# Patient Record
Sex: Male | Born: 1935 | Race: White | Hispanic: No | Marital: Married | Smoking: Never smoker
Health system: Southern US, Community
[De-identification: ages and names within clinical notes are randomized; demographics above are authoritative.]

## PROBLEM LIST (undated history)

## (undated) DIAGNOSIS — N400 Enlarged prostate without lower urinary tract symptoms: Secondary | ICD-10-CM

## (undated) DIAGNOSIS — N390 Urinary tract infection, site not specified: Secondary | ICD-10-CM

## (undated) DIAGNOSIS — E785 Hyperlipidemia, unspecified: Secondary | ICD-10-CM

## (undated) DIAGNOSIS — D126 Benign neoplasm of colon, unspecified: Secondary | ICD-10-CM

## (undated) DIAGNOSIS — IMO0001 Reserved for inherently not codable concepts without codable children: Secondary | ICD-10-CM

## (undated) DIAGNOSIS — I2510 Atherosclerotic heart disease of native coronary artery without angina pectoris: Secondary | ICD-10-CM

## (undated) DIAGNOSIS — I872 Venous insufficiency (chronic) (peripheral): Secondary | ICD-10-CM

## (undated) DIAGNOSIS — C61 Malignant neoplasm of prostate: Secondary | ICD-10-CM

## (undated) DIAGNOSIS — I219 Acute myocardial infarction, unspecified: Secondary | ICD-10-CM

## (undated) DIAGNOSIS — I10 Essential (primary) hypertension: Secondary | ICD-10-CM

## (undated) DIAGNOSIS — A419 Sepsis, unspecified organism: Secondary | ICD-10-CM

## (undated) DIAGNOSIS — M1990 Unspecified osteoarthritis, unspecified site: Secondary | ICD-10-CM

## (undated) DIAGNOSIS — G4730 Sleep apnea, unspecified: Secondary | ICD-10-CM

## (undated) DIAGNOSIS — K219 Gastro-esophageal reflux disease without esophagitis: Secondary | ICD-10-CM

## (undated) DIAGNOSIS — G709 Myoneural disorder, unspecified: Secondary | ICD-10-CM

## (undated) HISTORY — DX: Benign neoplasm of colon, unspecified: D12.6

---
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
## 1968-08-10 HISTORY — PX: VASECTOMY: SHX75

## 1987-08-11 HISTORY — PX: APPENDECTOMY: SHX54

## 1999-08-11 DIAGNOSIS — G4730 Sleep apnea, unspecified: Secondary | ICD-10-CM

## 1999-08-11 HISTORY — DX: Sleep apnea, unspecified: G47.30

## 2000-01-21 ENCOUNTER — Ambulatory Visit (HOSPITAL_BASED_OUTPATIENT_CLINIC_OR_DEPARTMENT_OTHER): Admission: RE | Admit: 2000-01-21 | Discharge: 2000-01-21 | Payer: Self-pay | Admitting: Endocrinology

## 2000-12-29 ENCOUNTER — Other Ambulatory Visit: Admission: RE | Admit: 2000-12-29 | Discharge: 2000-12-29 | Payer: Self-pay | Admitting: Urology

## 2000-12-29 ENCOUNTER — Encounter (INDEPENDENT_AMBULATORY_CARE_PROVIDER_SITE_OTHER): Payer: Self-pay

## 2000-12-30 HISTORY — PX: PROSTATE BIOPSY: SHX241

## 2002-01-08 DIAGNOSIS — D126 Benign neoplasm of colon, unspecified: Secondary | ICD-10-CM

## 2002-01-08 HISTORY — DX: Benign neoplasm of colon, unspecified: D12.6

## 2002-01-10 ENCOUNTER — Encounter (INDEPENDENT_AMBULATORY_CARE_PROVIDER_SITE_OTHER): Payer: Self-pay | Admitting: Specialist

## 2002-01-10 ENCOUNTER — Ambulatory Visit (HOSPITAL_COMMUNITY): Admission: RE | Admit: 2002-01-10 | Discharge: 2002-01-10 | Payer: Self-pay | Admitting: *Deleted

## 2002-08-10 DIAGNOSIS — M1990 Unspecified osteoarthritis, unspecified site: Secondary | ICD-10-CM

## 2002-08-10 HISTORY — PX: ROTATOR CUFF REPAIR: SHX139

## 2002-08-10 HISTORY — DX: Unspecified osteoarthritis, unspecified site: M19.90

## 2004-02-28 ENCOUNTER — Ambulatory Visit (HOSPITAL_COMMUNITY): Admission: RE | Admit: 2004-02-28 | Discharge: 2004-02-28 | Payer: Self-pay | Admitting: *Deleted

## 2004-02-28 ENCOUNTER — Encounter (INDEPENDENT_AMBULATORY_CARE_PROVIDER_SITE_OTHER): Payer: Self-pay | Admitting: *Deleted

## 2005-04-02 ENCOUNTER — Ambulatory Visit (HOSPITAL_COMMUNITY): Admission: RE | Admit: 2005-04-02 | Discharge: 2005-04-02 | Payer: Self-pay | Admitting: *Deleted

## 2005-04-02 ENCOUNTER — Encounter (INDEPENDENT_AMBULATORY_CARE_PROVIDER_SITE_OTHER): Payer: Self-pay | Admitting: *Deleted

## 2005-08-10 DIAGNOSIS — I219 Acute myocardial infarction, unspecified: Secondary | ICD-10-CM

## 2005-08-10 DIAGNOSIS — I2510 Atherosclerotic heart disease of native coronary artery without angina pectoris: Secondary | ICD-10-CM

## 2005-08-10 HISTORY — DX: Acute myocardial infarction, unspecified: I21.9

## 2005-08-10 HISTORY — DX: Atherosclerotic heart disease of native coronary artery without angina pectoris: I25.10

## 2005-09-18 HISTORY — PX: ANGIOPLASTY: SHX39

## 2007-05-06 ENCOUNTER — Encounter (INDEPENDENT_AMBULATORY_CARE_PROVIDER_SITE_OTHER): Payer: Self-pay | Admitting: *Deleted

## 2007-05-06 ENCOUNTER — Ambulatory Visit (HOSPITAL_COMMUNITY): Admission: RE | Admit: 2007-05-06 | Discharge: 2007-05-06 | Payer: Self-pay | Admitting: *Deleted

## 2010-12-23 NOTE — Op Note (Signed)
NAME:  Scott [name], WINTHROP                  ACCOUNT NO.:  [id number]   MEDICAL RECORD NO.:  [id number]          PATIENT TYPE:  AMB   LOCATION:  ENDO                         FACILITY:  [facility]   PHYSICIAN:  Georgiana Spinner, M.D.    DATE OF BIRTH:  [birth year]   DATE OF PROCEDURE:  DATE OF DISCHARGE:                               OPERATIVE REPORT   PROCEDURE:  Upper endoscopy.   INDICATIONS:  GERD.   ANESTHESIA:  Fentanyl 75 mcg, Versed 6 mg.   PROCEDURE:  With the patient mildly sedated in the left lateral  decubitus position, the Pentax videoscopic endoscope was inserted in the  mouth and passed under direct vision through the esophagus which  appeared normal, except there was 1 island of Barrett's esophagus and an  area of the squamocolumnar junction that looked irregular and probably  was a second area.  The latter was photographed but both were biopsied.  We entered into the stomach.  The fundus, body, antrum, duodenal bulb,  and second portion of the duodenum appeared normal.  From this point,  the endoscope was slowly withdrawn, taking circumferential views of the  duodenal mucosa until the endoscope had been pulled back into the  stomach, placed in retroflexion to view the stomach from below.  The  endoscope was straightened and withdrawn, taking circumferential views  of the remaining gastric and esophageal mucosa.  The patient's vital  signs and pulse oximeter remained stable.  The patient tolerated the  procedure well without apparent complication.   FINDINGS:  Very loose wrap of the GE junction around the endoscope and  two areas of presumed Barrett's esophagus.  Await biopsy report.  The  patient will call me for results and follow up with me as an outpatient.           ______________________________  Georgiana Spinner, M.D.     GMO/MEDQ  D:  05/06/2007  T:  05/06/2007  Job:  [id number]

## 2010-12-26 NOTE — Op Note (Signed)
NAME:  Scott [name], Scott [name]                  ACCOUNT NO.:  [id number]   MEDICAL RECORD NO.:  [id number]          PATIENT TYPE:  AMB   LOCATION:  ENDO                         FACILITY:  [facility]   PHYSICIAN:  Georgiana Spinner, M.D.    DATE OF BIRTH:  May 23, 1936   DATE OF PROCEDURE:  DATE OF DISCHARGE:                                 OPERATIVE REPORT   PROCEDURES:  Upper endoscopy with biopsy.   INDICATIONS:  Gastroesophageal reflux disease.   ANESTHESIA:  Demerol 50, Versed 5 mg.   PROCEDURE:  With the patient mildly sedated in the left lateral decubitus  position, the Olympus videoscopic endoscope was inserted in the mouth and  passed under direct vision through the esophagus which appeared normal until  we reached the distal esophagus and there appeared to be changes of  Barrett's, photographed and biopsied.  We entered into the stomach.  The  fundus, body, antrum, duodenal bulb, second portion of the duodenum were  visualized. From this point, the endoscope was slowly withdrawn taking  circumferential views of the duodenal mucosa until the endoscope had been  pulled back into the stomach and placed in retroflexion to view the stomach  from below. The endoscope was then straightened and withdrawn, taking  circumferential views of the remaining gastric and esophageal mucosa  stopping in the body of the stomach where erythematous changes were noted,  thickened and some edema of the gastric wall was noted and this was  biopsied. The patient's vital ,signs pulse oximeter remained stable. The  patient tolerated the procedure well with no apparent complications.   FINDINGS:  Changes of some thickening and edema of the gastric body,  biopsied.  Barrett's esophagus biopsied. Await biopsy report. The patient  will call me for results and follow up with me as an outpatient.           ______________________________  Georgiana Spinner, M.D.     GMO/MEDQ  D:  04/02/2005  T:  04/02/2005  Job:  [id number]

## 2010-12-26 NOTE — Op Note (Signed)
NAME:  Scott [name], Scott [name]                            ACCOUNT NO.:  [id number]   MEDICAL RECORD NO.:  [id number]                   PATIENT TYPE:  AMB   LOCATION:  ENDO                                 FACILITY:  [facility]   PHYSICIAN:  Georgiana Spinner, M.D.                 DATE OF BIRTH:  [birth year]   DATE OF PROCEDURE:  02/28/2004  DATE OF DISCHARGE:                                 OPERATIVE REPORT   PROCEDURE PERFORMED:  Colonoscopy.   ENDOSCOPIST:  Georgiana Spinner, M.D.   DESCRIPTION OF PROCEDURE:  With the patient mildly sedated in the left  lateral decubitus position, a rectal examination was performed which showed  a slightly enlarged prostate but without nodules, otherwise unremarkable.  Subsequently, the Olympus video colonoscope was inserted in the rectum and  passed under direct vision with pressure applied to the abdomen.  We were  able to reach the cecum, identified by the ileocecal valve and appendiceal  orifice, both of which were photographed.  From this point the colonoscope  was slowly withdrawn taking circumferential views of the colonic mucosa  stopping only in the rectum which appeared normal on direct and showed  hemorrhoids on retroflex view.  The endoscope was straightened and  withdrawn.  The patient's vital signs and pulse oximeter remained stable.  The patient tolerated the procedure well without apparent complications.   FINDINGS:  Internal hemorrhoids.  Otherwise unremarkable colonoscopic  examination.   PLAN:  See endoscopy note for further details.                                               Georgiana Spinner, M.D.    GMO/MEDQ  D:  02/28/2004  T:  02/28/2004  Job:  [id number]

## 2010-12-26 NOTE — Procedures (Signed)
[place]  Patient:    BARTLETT, ENKE Visit Number: [id number] MRN: [id number]          Service Type: END Location: ENDO Attending Physician:  Sabino Gasser Dictated by:   Sabino Gasser, M.D. Proc. Date: 01/10/02 Admit Date:  01/10/2002                             Procedure Report  PROCEDURE PERFORMED:  Colonoscopy.  ENDOSCOPIST:  Sabino Gasser, M.D.  INDICATIONS FOR PROCEDURE:  Colon cancer screening.  ANESTHESIA:  Demerol 10 mg, Versed 1 mg.  DESCRIPTION OF PROCEDURE:  With the patient mildly sedated in the left lateral decubitus position, a rectal exam was performed which was unremarkable. Subsequently, the Olympus videoscopic colonoscope was inserted in the rectum and passed under direct vision into the cecum, identified by the ileocecal valve and appendiceal orifice.  From this point, the colonoscope was slowly withdrawn, taking circumferential views of the entire colonic mucosa stopping then only in the rectum, which appeared normal on direct view and showed hemorrhoids on retroflex view.  The endoscope was straightened and withdrawn. Patients vital signs and pulse oximeter remained stable.  The patient tolerated the procedure well and without apparent complications.  FINDINGS:  Internal hemorrhoids.  PLAN:  Await biopsy report from endoscopy and have the patient follow up with me as described above. Dictated by:   Sabino Gasser, M.D. Attending Physician:  Sabino Gasser DD:  01/10/02 TD:  01/11/02 Job: [id number]

## 2010-12-26 NOTE — Op Note (Signed)
NAME:  Scott [name], Scott [name]                            ACCOUNT NO.:  [id number]   MEDICAL RECORD NO.:  [id number]                   PATIENT TYPE:  AMB   LOCATION:  ENDO                                 FACILITY:  [facility]   PHYSICIAN:  Georgiana Spinner, M.D.                 DATE OF BIRTH:  [birth year]   DATE OF PROCEDURE:  02/28/2004  DATE OF DISCHARGE:                                 OPERATIVE REPORT   PROCEDURE:  Upper endoscopy with biopsy.   INDICATIONS:  Hemoccult positivity.   ANESTHESIA:  1. Demerol 50.  2. Versed 5 mg.   PROCEDURE:  With the patient mildly sedated in the left lateral decubitus  position, the Olympus videoscopic endoscope was inserted in the mouth,  passed under direct vision through the esophagus which appeared normal until  we reached the distal esophagus and there was a question of Barrett's.  Although this may have been normal variant, we could not get the esophagus  to be fully insufflated, therefore, we biopsied this area.  We entered into  the stomach, fundus, body, antrum, duodenal bulb, second portion of duodenum  all appeared normal.  From this point the endoscope was slowly withdrawn,  taking circumferential views of the duodenal mucosa until the endoscope was  pulled back into the stomach, placed in retroflexion to view the stomach  from below.  The endoscope was straightened and withdrawn, taking  circumferential views of the remaining gastric and esophageal mucosa.  The  patient's vital signs and pulse oximeter remained stable.  Patient tolerated  procedure well without apparent complication.   FINDINGS:  Question of Barrett's esophagus, biopsied.  Await biopsy report.  Patient will call me for results and followup with me as an outpatient.                                               Georgiana Spinner, M.D.    GMO/MEDQ  D:  02/28/2004  T:  02/28/2004  Job:  [id number]

## 2010-12-26 NOTE — Op Note (Signed)
[place]  Patient:    Scott [name], Scott [name] Visit Number: [id number] MRN: [id number]          Service Type: END Location: ENDO Attending Physician:  Sabino Gasser Dictated by:   Sabino Gasser, M.D. Proc. Date: 01/10/02 Admit Date:  01/10/2002                             Operative Report  PROCEDURE PERFORMED:  Upper endoscopy.  ENDOSCOPIST:  Sabino Gasser, M.D.  INDICATIONS FOR PROCEDURE:  Gastroesophageal reflux disease.  ANESTHESIA:  Demerol 60 mg, Versed 6 mg.  DESCRIPTION OF PROCEDURE:  With the patient mildly sedated in the left lateral decubitus position, the Olympus video endoscope was inserted in the mouth and passed under direct vision through the esophagus which appeared normal until we reached the distal esophagus and there was a question of esophagitis photographed and biopsied.  We entered into the stomach.  The fundus, body, antrum, duodenal bulb and second portion of the duodenum were visualized. From this point, the endoscope was slowly withdrawn taking circumferential views of the entire duodenal mucosa until the endoscope had been pulled back into the stomach and placed on retroflexion to view the stomach from below and a hiatal hernia was seen and photographed.  The endoscope was then straightened and withdrawn taking circumferential views of the remaining gastric and esophageal mucosa stopping in the antrum of the stomach where changes of gastritis were seen and photographed and biopsied.  Patients vital signs and pulse oximeter remained stable.  The patient tolerated the procedure well without apparent complications.  FINDINGS:  Question of gastritis in the antrum, biopsied and esophagitis seen in the distal esophagus biopsied, await biopsy report.  Patient will call me for results and follow up with me as an outpatient.  Proceed to colonoscopy as planned.  PLAN: Dictated by:   Sabino Gasser, M.D. Attending Physician:  Sabino Gasser DD:  01/10/02 TD:  01/11/02 Job: [id number] IO/[id number]

## 2011-08-11 DIAGNOSIS — C61 Malignant neoplasm of prostate: Secondary | ICD-10-CM

## 2011-08-11 HISTORY — DX: Malignant neoplasm of prostate: C61

## 2012-01-29 ENCOUNTER — Encounter: Payer: Self-pay | Admitting: Gastroenterology

## 2012-02-16 ENCOUNTER — Other Ambulatory Visit: Payer: Self-pay | Admitting: Cardiology

## 2012-02-16 DIAGNOSIS — I872 Venous insufficiency (chronic) (peripheral): Secondary | ICD-10-CM

## 2012-02-23 ENCOUNTER — Ambulatory Visit
Admission: RE | Admit: 2012-02-23 | Discharge: 2012-02-23 | Disposition: A | Discharge status: Home / Self Care | Payer: 59 | Source: Ambulatory Visit | Attending: Cardiology | Admitting: Cardiology

## 2012-02-23 DIAGNOSIS — I872 Venous insufficiency (chronic) (peripheral): Secondary | ICD-10-CM

## 2012-02-23 HISTORY — DX: Acute myocardial infarction, unspecified: I21.9

## 2012-02-23 HISTORY — DX: Atherosclerotic heart disease of native coronary artery without angina pectoris: I25.10

## 2012-02-23 HISTORY — DX: Unspecified osteoarthritis, unspecified site: M19.90

## 2012-02-23 HISTORY — DX: Benign prostatic hyperplasia without lower urinary tract symptoms: N40.0

## 2012-02-23 HISTORY — DX: Gastro-esophageal reflux disease without esophagitis: K21.9

## 2012-02-23 HISTORY — DX: Hyperlipidemia, unspecified: E78.5

## 2012-02-23 HISTORY — DX: Venous insufficiency (chronic) (peripheral): I87.2

## 2012-02-23 HISTORY — DX: Essential (primary) hypertension: I10

## 2012-02-23 HISTORY — DX: Sleep apnea, unspecified: G47.30

## 2012-02-23 NOTE — Progress Notes (Signed)
Patient ID: Scott [name], male   DOB: 03/31/1936, 76 y.o.   MRN: [id number]  Reason for Visit: Bilateral lower extremity swelling and evaluate for venous insufficiency.  History of present illness: [name] is a very nice 76 year old white male with a known history of coronary artery disease. He had a myocardial infarction in 2007 and underwent cardiac catheterization and stent placement. He complains of lower extremity swelling for many years. His main complaint is pain in the calf region. He also complains of some tiredness in the lower extremities. He used to be able to walk for three miles but now his exercise tolerance is limited due to pain and discomfort. He has been wearing knee high compression stockings measuring 15-20 mm of compression. He says that the stockings help with some of the swelling but do not significantly change the pain. He has started taking gabapentin in the last month which he says has improved the pain by 80-90 percent. He still continues to take some Tylenol for symptomatic relief of his pain. At this time, he continues to exercise on a regular basis but his walking is limited due to lower extremity pain and symptoms.  Past Medical History: Myocardial infarction and status post stent in 2007, sleep apnea, appendectomy, rotator cuff surgery, tonsillectomy and adenoidectomy.  Medications: Gabapentin 300 mg, finasteride 5 mg, Plavix 75 mg, ranitidine 150 mg, pravastatin 40 mg, amlodipine 10 mg, aspirin once a day 325 mg, iron, multivitamin, furosemide 40 mg as needed.  Allergies: Sulfa and shellfish.  Social History: He is married and has four children. He is retired. He used to work in Production manager for [employer]. He currently lives in [place]. He denies tobacco or alcohol use.  Review of Systems: In general, he feels like he is in good health. He does have allergy to sulfa and shellfish. No significant eyes, ears, nose, mouth or throat  problems. No significant gastrointestinal problems except for heartburn. GU symptoms: History of elevated PSA. Scheduled for a prostate biopsy according to the patient. Cardiovascular is significant for myocardial infarction and status post stent placement. Musculoskeletal is significant for knee pain and history of right rotator cuff surgery. No significant neurologic problems, psychiatric or pulmonary problems.  Physical Examination: Vitals: Blood pressure 126/65, pulse 57, respirations 15, temperature 97.6, 02 sats 97 percent on room air. In general, healthy appearing elderly man. Heart: Regular rate and rhythm. Lungs: Clear to auscultation bilaterally. Abdomen: Soft, nontender and positive bowel sounds. No evidence for spider veins. He has bruising in the left upper abdomen which he says is related to being on Plavix. Lower extremities: Bounding pulses in both groins and bilateral pedal pulses. There is bilateral symmetric edema involving the calves and ankle regions bilaterally. There is also swelling along the dorsal aspect of both feet. There is some mild redness in the plantar aspects of both feet. No evidence for ulcerations. Patient has no evidence for varicose veins or telangiectasias.  Imaging: Bilateral lower extremity superficial and deep venous studies were done. The study is negative for deep vein thrombosis and negative for superficial venous insufficiency. However, the patient does have atypical lymph nodes in both groin regions. The lymph nodes are rounded and appear to be slightly full.  Assessment: 76 year old male with bilateral lower extremity edema, particularly in the calf and feet. The patient's main complaint is pain and tiredness. Fortunately, the pain has markedly improved since he started taking gabapentin approximately one month ago. The ultrasound exam shows no evidence for deep  vein thrombosis and no evidence for superficial venous insufficiency.  However, the patient does have prominent atypical bilateral lymph nodes in the inguinal regions. According to the patient, he also has an elevated PSA level and is scheduled to have a prostate biopsy. The lymph nodes within the inguinal regions could be related to chronic lower extremity swelling. However, the atypical appearance of the lymph nodes and the history of enlarged prostate and elevated PSA are concerning.  Plan:  1. Bilateral lower extremity swelling: There is no evidence for deep vein thrombosis and no evidence for superficial venous insufficiency. No indication for any percutaneous laser treatment or sclerotherapy. I recommended the patient try thigh high compression stockings measuring 20-30 mmHg. 2. Atypical lymph nodes in inguinal regions bilaterally. I discussed these findings with the referring physician Dr. Jacinto Halim. Dr. Jacinto Halim told me that his technologist also noted prominent lymph nodes during lower extremity arterial exam. Based on the history of prostate enlargement, elevated PSA and abnormal lymph nodes, we will plan to obtain a CT of the abdomen and pelvis with IV and oral contrast to exclude intraabdominal pathology or additional lymphadenopathy.

## 2012-02-24 ENCOUNTER — Other Ambulatory Visit: Payer: Self-pay | Admitting: Cardiology

## 2012-02-24 DIAGNOSIS — R591 Generalized enlarged lymph nodes: Secondary | ICD-10-CM

## 2012-02-25 ENCOUNTER — Other Ambulatory Visit: Payer: PRIVATE HEALTH INSURANCE

## 2012-03-01 ENCOUNTER — Other Ambulatory Visit: Payer: Self-pay | Admitting: Gastroenterology

## 2012-03-07 ENCOUNTER — Encounter: Payer: Self-pay | Admitting: Gastroenterology

## 2012-03-07 ENCOUNTER — Ambulatory Visit (INDEPENDENT_AMBULATORY_CARE_PROVIDER_SITE_OTHER): Payer: PRIVATE HEALTH INSURANCE | Admitting: Gastroenterology

## 2012-03-07 VITALS — BP 120/60 | HR 60 | Ht 66.0 in | Wt 171.1 lb

## 2012-03-07 DIAGNOSIS — K219 Gastro-esophageal reflux disease without esophagitis: Secondary | ICD-10-CM

## 2012-03-07 DIAGNOSIS — Z860100 Personal history of colon polyps, unspecified: Secondary | ICD-10-CM | POA: Insufficient documentation

## 2012-03-07 DIAGNOSIS — I2510 Atherosclerotic heart disease of native coronary artery without angina pectoris: Secondary | ICD-10-CM

## 2012-03-07 MED ORDER — MOVIPREP 100 G PO SOLR: 1.0000 | Freq: Once | ORAL | Status: DC

## 2012-03-07 NOTE — Patient Instructions (Addendum)
You have been scheduled for a colonoscopy with propofol. Please follow written instructions given to you at your visit today.  Please pick up your prep kit at the pharmacy within the next 1-3 days. If you use inhalers (even only as needed), please bring them with you on the day of your procedure. You will be contacted by our office prior to your procedure for directions on holding your Plavix.  If you do not hear from our office 1 week prior to your scheduled procedure, please call [phone] to discuss.  Cc: Darci Needle, MD       Dr. Jacinto Halim

## 2012-03-07 NOTE — Progress Notes (Signed)
History of Present Illness: This is a 76 year old male with a history of colon polyps in June 2003. There was apparently a question of Barrett's esophagus on endoscopy in 2003 however biopsies did not reveal Barrett's esophagus. He has subsequent endoscopies performed 2005, 2006 and 2008 and none of those biopsies revealed Barrett's esophagus. He states his reflux symptoms for under control on Zantac 150 mg twice a day. He has no other gastrointestinal complaints. He is scheduled to undergo a prostate biopsy on August 9. He saw Dr. Jacinto Halim who recently who cleared him to remain off Plavix for 5 days prior to his prostate biopsy. Denies weight loss, abdominal pain, constipation, diarrhea, change in stool caliber, melena, hematochezia, nausea, vomiting, dysphagia, reflux symptoms, chest pain.  Allergies  Allergen Reactions  . Shellfish Allergy Nausea And Vomiting  . Sulfa Antibiotics Other (See Comments)    Feels fatigued and weak when taking sulfa   Outpatient Prescriptions Prior to Visit  Medication Sig Dispense Refill  . amLODipine (NORVASC) 10 MG tablet Take 10 mg by mouth daily.      Marland Kitchen aspirin 81 MG tablet Take 325 mg by mouth daily.       . benazepril (LOTENSIN) 20 MG tablet Take 20 mg by mouth daily.      . clopidogrel (PLAVIX) 75 MG tablet Take 75 mg by mouth daily.      . Ferrous Gluconate (IRON) 240 (27 FE) MG TABS Take 1 capsule by mouth daily.      . finasteride (PROSCAR) 5 MG tablet Take 5 mg by mouth daily.      . Multiple Vitamin (MULTIVITAMIN) tablet Take 1 tablet by mouth daily.      . pravastatin (PRAVACHOL) 80 MG tablet Take 40 mg by mouth daily.      . ranitidine (ZANTAC) 150 MG capsule Take 150 mg by mouth 2 (two) times daily.       Past Medical History  Diagnosis Date  . Arthritis 2004  . Myocardial infarction   . Coronary artery disease 2007    cardiac cath w/ angioplasty & stent placement x 1  . Sleep apnea 2001  . Hypertension   . Hyperlipidemia   . Acid reflux   .  Venous insufficiency   . Enlarged prostate   . Adenomatous colon polyp 01/2002   Past Surgical History  Procedure Date  . Appendectomy 1989  . Angioplasty 09/18/2005    1 stent placed  . Rotator cuff repair 2004    right  . Tonsillectomy and adenoidectomy 1950  . Vasectomy 1970  . Prostate biopsy 12/30/2000   History   Social History  . Marital Status: Married    Spouse Name: N/A    Number of Children: 4  . Years of Education: N/A   Occupational History  . retired Sport and exercise psychologist    Social History Main Topics  . Smoking status: Never Smoker   . Smokeless tobacco: Never Used  . Alcohol Use: No  . Drug Use: No  . Sexually Active: None   Other Topics Concern  . None   Social History Narrative  . None   Family History  Problem Relation Age of Onset  . Prostate cancer Brother   . Diabetes Father   . Diabetes Sister   . Diabetes Brother   . Heart disease Father   . Heart disease Mother   . Heart disease Sister   . Heart disease Brother     Review of Systems: Pertinent positive and negative review  of systems were noted in the above HPI section. All other review of systems were otherwise negative.  Physical Exam: General: Well developed , well nourished, no acute distress Head: Normocephalic and atraumatic Eyes:  sclerae anicteric, EOMI Ears: Normal auditory acuity Mouth: No deformity or lesions Neck: Supple, no masses or thyromegaly Lungs: Clear throughout to auscultation Heart: Regular rate and rhythm; no murmurs, rubs or bruits Abdomen: Soft, non tender and non distended. No masses, hepatosplenomegaly or hernias noted. Normal Bowel sounds Rectal: Deferred to colonoscopy Musculoskeletal: Symmetrical with no gross deformities  Skin: No lesions on visible extremities Pulses:  Normal pulses noted Extremities: No clubbing, cyanosis, edema or deformities noted Neurological: Alert oriented x 4, grossly nonfocal Cervical Nodes:  No significant cervical  adenopathy Inguinal Nodes: No significant inguinal adenopathy Psychological:  Alert and cooperative. Normal mood and affect  Assessment and Recommendations:  1. Personal history of adenomatous colon polyps in June 2003. He is overdue for surveillance colonoscopy. The risks, benefits and alternatives to a 5 day hold Plavix were discussed with the patient and he consents to proceed. Obtain records from Dr. Lewis Moccasin regarding clearance to hold Plavix. The patient can remain on daily aspirin for colonoscopy. He will follow the instructions for his prostate biopsy regarding aspirin. The risks, benefits, and alternatives to colonoscopy with possible biopsy and possible polypectomy were discussed with the patient and they consent to proceed. Will attempt to schedule his colonoscopy prior to the prostate biopsy but I would want to proceed no sooner than 7-10 days after the prostate biopsy if the colonoscopy is scheduled to follow his prostate biopsy.  2. GERD. There was a questionable history of Barrett's esophagus in the past however his last 3 endoscopies with biopseis have not shown any evidence of Barrett's. His reflux symptoms are well controlled on ranitidine 150 mg twice a day. Continue ranitidine and standard antireflux measures. No need for surveillance endoscopy as the patient has been adequately evaluated for Barrett's and this diagnosis has been reasonably excluded.  3. Coronary artery disease with coronary stent placement maintained on Plavix and aspirin. See problem #1.

## 2012-03-09 ENCOUNTER — Telehealth: Payer: Self-pay

## 2012-03-09 NOTE — Telephone Encounter (Signed)
Received records from Dr. Jacinto Halim stating patient has clearance to come off coumadin and aspirin 5 days before his surgery. Informed patient per Dr. Russella Dar to stop 2 days before that so he can be off 5 days before his Colonoscopy also. Pt will stop Plavix and aspirin on 03/11/12. Pt agreed and verbalized understanding.

## 2012-03-16 ENCOUNTER — Ambulatory Visit (AMBULATORY_SURGERY_CENTER): Payer: PRIVATE HEALTH INSURANCE | Admitting: Gastroenterology

## 2012-03-16 ENCOUNTER — Encounter: Payer: Self-pay | Admitting: Gastroenterology

## 2012-03-16 VITALS — BP 132/74 | HR 61 | Temp 97.1°F | Resp 20 | Ht 66.0 in | Wt 171.0 lb

## 2012-03-16 DIAGNOSIS — Z86010 Personal history of colonic polyps: Secondary | ICD-10-CM

## 2012-03-16 DIAGNOSIS — Z1211 Encounter for screening for malignant neoplasm of colon: Secondary | ICD-10-CM

## 2012-03-16 MED ORDER — SODIUM CHLORIDE 0.9 % IV SOLN: 500.0000 mL | INTRAVENOUS | Status: DC

## 2012-03-16 NOTE — Patient Instructions (Signed)
RESTARTING PLAVIX AND ASPIRIN OK WITH DR. Russella Dar WHENEVER CLEARED BY UROLOGIST SINCE HAVING A PROSTATE BX  INFORMATION ON HEMORRHOIDS GIVEN TO YOU TODAY.  YOU HAD AN ENDOSCOPIC PROCEDURE TODAY AT THE  ENDOSCOPY CENTER: Refer to the procedure report that was given to you for any specific questions about what was found during the examination.  If the procedure report does not answer your questions, please call your gastroenterologist to clarify.  If you requested that your care partner not be given the details of your procedure findings, then the procedure report has been included in a sealed envelope for you to review at your convenience later.  YOU SHOULD EXPECT: Some feelings of bloating in the abdomen. Passage of more gas than usual.  Walking can help get rid of the air that was put into your GI tract during the procedure and reduce the bloating. If you had a lower endoscopy (such as a colonoscopy or flexible sigmoidoscopy) you may notice spotting of blood in your stool or on the toilet paper. If you underwent a bowel prep for your procedure, then you may not have a normal bowel movement for a few days.  DIET: Your first meal following the procedure should be a light meal and then it is ok to progress to your normal diet.  A half-sandwich or bowl of soup is an example of a good first meal.  Heavy or fried foods are harder to digest and may make you feel nauseous or bloated.  Likewise meals heavy in dairy and vegetables can cause extra gas to form and this can also increase the bloating.  Drink plenty of fluids but you should avoid alcoholic beverages for 24 hours.  ACTIVITY: Your care partner should take you home directly after the procedure.  You should plan to take it easy, moving slowly for the rest of the day.  You can resume normal activity the day after the procedure however you should NOT DRIVE or use heavy machinery for 24 hours (because of the sedation medicines used during the test).     SYMPTOMS TO REPORT IMMEDIATELY: A gastroenterologist can be reached at any hour.  During normal business hours, 8:30 AM to 5:00 PM Monday through Friday, call [phone].  After hours and on weekends, please call the GI answering service at [phone] who will take a message and have the physician on call contact you.   Following lower endoscopy (colonoscopy or flexible sigmoidoscopy):  Excessive amounts of blood in the stool  Significant tenderness or worsening of abdominal pains  Swelling of the abdomen that is new, acute  Fever of 100F or higher   FOLLOW UP: If any biopsies were taken you will be contacted by phone or by letter within the next 1-3 weeks.  Call your gastroenterologist if you have not heard about the biopsies in 3 weeks.  Our staff will call the home number listed on your records the next business day following your procedure to check on you and address any questions or concerns that you may have at that time regarding the information given to you following your procedure. This is a courtesy call and so if there is no answer at the home number and we have not heard from you through the emergency physician on call, we will assume that you have returned to your regular daily activities without incident.  SIGNATURES/CONFIDENTIALITY: You and/or your care partner have signed paperwork which will be entered into your electronic medical record.  These signatures attest  to the fact that that the information above on your After Visit Summary has been reviewed and is understood.  Full responsibility of the confidentiality of this discharge information lies with you and/or your care-partner.

## 2012-03-16 NOTE — Op Note (Signed)
[facility] [address] [place], [place]  [address]  COLONOSCOPY PROCEDURE REPORT  PATIENT:  Scott [name], Scott [name]  MR#:  [id number] BIRTHDATE:  [birth year], 76 yrs. old  GENDER:  male ENDOSCOPIST:  Judie Petit T. Russella Dar, MD, [facility] Referred by:  Adela Lank, M.D. PROCEDURE DATE:  03/16/2012 PROCEDURE:  Colonoscopy 19147 ASA CLASS:  Class III INDICATIONS:  1) surveillance and high-risk screening  2) history of pre-cancerous (adenomatous) colon polyps: 01/2002 MEDICATIONS:   MAC sedation, administered by CRNA, propofol (Diprivan) 100 mg IV DESCRIPTION OF PROCEDURE:   After the risks benefits and alternatives of the procedure were thoroughly explained, informed consent was obtained.  Digital rectal exam was performed and revealed no abnormalities.   The LB CF-[id number] endoscope was introduced through the anus and advanced to the cecum, which was identified by both the appendix and ileocecal valve, without limitations.  The quality of the prep was adequate, using MoviPrep.  The instrument was then slowly withdrawn as the colon was fully examined. <<PROCEDUREIMAGES>> FINDINGS:  A normal appearing cecum, ileocecal valve, and appendiceal orifice were identified. The ascending, hepatic flexure, transverse, splenic flexure, descending, sigmoid colon, and rectum appeared unremarkable.  Retroflexed views in the rectum revealed internal hemorrhoids, small.  The time to cecum =  4.75 minutes. The scope was then withdrawn (time =  9.75  min) from the patient and the procedure completed.  COMPLICATIONS:  None  ENDOSCOPIC IMPRESSION: 1) Normal colon 2) Internal hemorrhoids  RECOMMENDATIONS: 1) Repeat Colonoscopy in 5 years.  Venita Lick. Russella Dar, MD, [facility]  n. eSIGNED:   Venita Lick. Panagiotis Oelkers at 03/16/2012 01:44 PM  Karlyne Greenspan, [phone]

## 2012-03-17 ENCOUNTER — Telehealth: Payer: Self-pay

## 2012-03-17 NOTE — Telephone Encounter (Signed)
  Follow up Call-  Call back number 03/16/2012  Post procedure Call Back phone  # [phone]  Permission to leave phone message Yes     Patient questions:  Do you have a fever, pain , or abdominal swelling? no Pain Score  0 *  Have you tolerated food without any problems? yes  Have you been able to return to your normal activities? yes  Do you have any questions about your discharge instructions: Diet   no Medications  no Follow up visit  no  Do you have questions or concerns about your Care? no  Actions: * If pain score is 4 or above: No action needed, pain <4.

## 2012-03-18 ENCOUNTER — Encounter: Payer: PRIVATE HEALTH INSURANCE | Admitting: Gastroenterology

## 2012-07-28 ENCOUNTER — Telehealth: Payer: Self-pay | Admitting: Oncology

## 2012-07-28 NOTE — Telephone Encounter (Signed)
S/W PT IN REF TO NP APPT. ON 08/12/12@1 :30 REFERRING DR WOODRUFF DX BILATERAL INGUINAL ADENOPATHY MAILED NP APPT.

## 2012-07-29 ENCOUNTER — Telehealth: Payer: Self-pay | Admitting: Oncology

## 2012-07-29 NOTE — Telephone Encounter (Signed)
C/D 07/29/12 for appt. 08/12/12

## 2012-08-01 ENCOUNTER — Other Ambulatory Visit: Payer: Self-pay | Admitting: Oncology

## 2012-08-01 DIAGNOSIS — R599 Enlarged lymph nodes, unspecified: Secondary | ICD-10-CM

## 2012-08-12 ENCOUNTER — Telehealth: Payer: Self-pay | Admitting: Oncology

## 2012-08-12 ENCOUNTER — Ambulatory Visit (HOSPITAL_BASED_OUTPATIENT_CLINIC_OR_DEPARTMENT_OTHER): Payer: Medicare Other | Admitting: Oncology

## 2012-08-12 ENCOUNTER — Other Ambulatory Visit (HOSPITAL_BASED_OUTPATIENT_CLINIC_OR_DEPARTMENT_OTHER): Payer: Medicare Other | Admitting: Lab

## 2012-08-12 ENCOUNTER — Other Ambulatory Visit: Payer: PRIVATE HEALTH INSURANCE | Admitting: Lab

## 2012-08-12 ENCOUNTER — Ambulatory Visit: Payer: PRIVATE HEALTH INSURANCE

## 2012-08-12 VITALS — BP 143/70 | HR 58 | Temp 97.0°F | Resp 20 | Ht 66.0 in | Wt 171.7 lb

## 2012-08-12 DIAGNOSIS — C61 Malignant neoplasm of prostate: Secondary | ICD-10-CM

## 2012-08-12 DIAGNOSIS — R599 Enlarged lymph nodes, unspecified: Secondary | ICD-10-CM

## 2012-08-12 LAB — CBC WITH DIFFERENTIAL/PLATELET
Basophils Absolute: 0.1 10*3/uL (ref 0.0–0.1)
Eosinophils Absolute: 0.4 10*3/uL (ref 0.0–0.5)
HCT: 41.9 % (ref 38.4–49.9)
LYMPH%: 22.5 % (ref 14.0–49.0)
MONO#: 1 10*3/uL — ABNORMAL HIGH (ref 0.1–0.9)
NEUT#: 5 10*3/uL (ref 1.5–6.5)
NEUT%: 59.7 % (ref 39.0–75.0)
Platelets: 205 10*3/uL (ref 140–400)
WBC: 8.3 10*3/uL (ref 4.0–10.3)

## 2012-08-12 LAB — COMPREHENSIVE METABOLIC PANEL (CC13)
CO2: 24 mEq/L (ref 22–29)
Creatinine: 1.2 mg/dL (ref 0.7–1.3)
Glucose: 123 mg/dl — ABNORMAL HIGH (ref 70–99)
Total Bilirubin: 0.5 mg/dL (ref 0.20–1.20)

## 2012-08-12 LAB — LACTATE DEHYDROGENASE (CC13): LDH: 179 U/L (ref 125–245)

## 2012-08-12 NOTE — Telephone Encounter (Signed)
appts made and printed for pt  °

## 2012-08-12 NOTE — Progress Notes (Signed)
Note dictated

## 2012-08-12 NOTE — Progress Notes (Signed)
CC:   Scott Leatherwood, MD  REASON FOR CONSULTATION:  Adenopathy, history of prostate cancer.  HISTORY OF PRESENT ILLNESS:  Scott [name] is a pleasant 77 year old gentleman, native of [place], lived at multiple locations, currently of [place].  He is a gentleman with a past medical history that is significant for coronary artery disease. The patient was diagnosed with prostate cancer.  He is under the care of Dr. Natalia [name] at [facility].  He presented with an elevated PSA; his PSA in May 2013 was 12.93 and has declined down to 5.8 on 07/21/2012.  He is a currently on a watchful waiting approach.  The patient has gone under evaluation for his chronic lower extremity swelling and was evaluated for possible venous insufficiency.  At that time he had an ultrasound which showed an exophytic hypoechoic area in some in the abdominal lymph nodes, with a lymph node measuring as large as 1.2 cm in the short axis in the inguinal and the upper thigh regions bilaterally.  There was no evidence of any superficial venous insufficiency or any deep vein thrombosis; however, these findings warranted further evaluation.  The patient tells me that he was ordered a CT scan of the abdomen and pelvis with IV contrast and it was cancelled, although he could not really tell me exactly why that is.  The patient was referred to me for evaluation regarding that.  Clinically, he had not had any particular symptoms.  He had not reported any palpable adenopathy.  Did not report any neck adenopathy.  Denied any inguinal area adenopathy or any enlargements of any of his lymph nodes.  He had not reported any constitutional symptoms.  Did not report any weight loss or appetite changes.  He still performs activities of daily living without any hindrance or decline. Clinically he is completely asymptomatic at this time.  He had not had any change in his appetite or performance status.  He is still very functional.   He drives and takes care of his house affairs, including his wife.  REVIEW OF SYSTEMS:  Does not report any headaches, blurry vision, double vision.  Does not report any motor or sensory neuropathy.  Does not report any  alteration in mental status.  Does not report any psychiatric issues or depression.  Does not report any fever, chills, sweats.  Does not report any cough, hemoptysis, or hematemesis.  No nausea, vomiting.  No abdominal pain.  No hematochezia.  No melena.  No genitourinary complaints.  The rest of the review of systems is unremarkable.  PAST MEDICAL HISTORY:  Significant for a history of coronary artery disease, history of GERD, hyperlipidemia, hypertension, arthritis, and chronic lower extremity pain and possible neuropathy.  He is status post appendectomy, rotator cuff surgery, as well as a history of prostate cancer, as mentioned.  MEDICATIONS: 1. Amlodipine. 2. Aspirin. 3. Benazepril. 4. Finasteride. 5. Gabapentin. 6. Iron. 7. Plavix. 8. Pravastatin. 9. Zantac.  ALLERGIES:  Sulfa.  FAMILY HISTORY:  His brother had prostate cancer.  No other history of any malignancies noted.  SOCIAL HISTORY:  He lives with his wife.  He has a son that lives with him as well.  Denied any alcohol or tobacco abuse.  He worked as a [employer] manager.  He also was in the [employer]  PHYSICAL EXAMINATION:  General:  Alert, awake, pleasant gentleman, appeared in no active distress.  Vital Signs:  His blood pressure is 143/70, pulse 58, respirations 20, temperature is 97.  Weight is 171  pounds.  ECOG performance status is 0.  HEENT:  Head is normocephalic, atraumatic.  Pupils equal, round, reactive to light.  Oral mucosa moist and pink.  Neck:  Supple, without adenopathy.  Heart:  Regular rate and rhythm.  S1, S2.  Lungs:  Clear to auscultation.  No rhonchi, wheeze, or dullness to percussion.  Abdomen:  Soft, nontender.  No hepatosplenomegaly.  Extremities:  Had 2+ ankle  edema.  Lymphatic:  I could not appreciate any lymphadenopathy in the neck, supraclavicular, axillary, or inguinal adenopathy.  ASSESSMENT AND PLAN:  This is a pleasant 77 year old gentleman with the following issues: 1. Inguinal and upper thigh enlarged lymph nodes bilaterally:  The     differential diagnosis discussed with Mr. Celani could be possibly     reactive lymphadenopathy related to infection or surgery.     Metastatic solid tumor cancer is also a possibility.  His prostate     cancer is under excellent control.  I do not think that is the case     here.  Obviously a lymphoproliferative process such as lymphoma, an     indolent kind, could be a possibility.  To work this up, I would     like to repeat imaging studies including CT scan of the abdomen and     pelvis with contrast, and if there is any pathologically enlarged     lymph node, then a biopsy would be warranted.  If his lymph nodes     appear reactive, there is really no further workup that is     indicated at this time.  His laboratory data were discussed today     and were all within normal range at this time.  After obtaining a     CT scan, I will ask him to come back and discuss these results, and     will proceed from there. 2. History of prostate cancer:  Again is under watchful observation     under the care of Dr. Margarita Grizzle.  His PSA is under reasonable     control.  He is scheduled for a repeat biopsy at the end of this     month.    ______________________________ Scott [name], M.D. FNS/MEDQ  D:  08/12/2012  T:  08/12/2012  Job:  [id number]

## 2012-08-24 ENCOUNTER — Ambulatory Visit (HOSPITAL_COMMUNITY)
Admission: RE | Admit: 2012-08-24 | Discharge: 2012-08-24 | Disposition: A | Discharge status: Home / Self Care | Payer: Medicare Other | Source: Ambulatory Visit | Attending: Oncology | Admitting: Oncology

## 2012-08-24 ENCOUNTER — Encounter (HOSPITAL_COMMUNITY): Payer: Self-pay

## 2012-08-24 DIAGNOSIS — I517 Cardiomegaly: Secondary | ICD-10-CM | POA: Insufficient documentation

## 2012-08-24 DIAGNOSIS — K8020 Calculus of gallbladder without cholecystitis without obstruction: Secondary | ICD-10-CM | POA: Insufficient documentation

## 2012-08-24 DIAGNOSIS — K449 Diaphragmatic hernia without obstruction or gangrene: Secondary | ICD-10-CM | POA: Insufficient documentation

## 2012-08-24 DIAGNOSIS — I2510 Atherosclerotic heart disease of native coronary artery without angina pectoris: Secondary | ICD-10-CM | POA: Insufficient documentation

## 2012-08-24 DIAGNOSIS — R599 Enlarged lymph nodes, unspecified: Secondary | ICD-10-CM

## 2012-08-24 DIAGNOSIS — Z9089 Acquired absence of other organs: Secondary | ICD-10-CM | POA: Insufficient documentation

## 2012-08-24 DIAGNOSIS — C61 Malignant neoplasm of prostate: Secondary | ICD-10-CM | POA: Insufficient documentation

## 2012-08-24 DIAGNOSIS — N281 Cyst of kidney, acquired: Secondary | ICD-10-CM | POA: Insufficient documentation

## 2012-08-24 DIAGNOSIS — N400 Enlarged prostate without lower urinary tract symptoms: Secondary | ICD-10-CM | POA: Insufficient documentation

## 2012-08-24 MED ORDER — IOHEXOL 300 MG/ML  SOLN
100.0000 mL | Freq: Once | INTRAMUSCULAR | Status: AC | PRN
  Administered 2012-08-24: 100 mL via INTRAVENOUS

## 2012-08-26 ENCOUNTER — Ambulatory Visit (HOSPITAL_BASED_OUTPATIENT_CLINIC_OR_DEPARTMENT_OTHER): Payer: Medicare Other | Admitting: Oncology

## 2012-08-26 VITALS — BP 129/63 | HR 57 | Temp 96.7°F | Resp 18 | Ht 66.0 in | Wt 173.1 lb

## 2012-08-26 DIAGNOSIS — Z8546 Personal history of malignant neoplasm of prostate: Secondary | ICD-10-CM

## 2012-08-26 DIAGNOSIS — R599 Enlarged lymph nodes, unspecified: Secondary | ICD-10-CM

## 2012-08-26 NOTE — Progress Notes (Signed)
Hematology and Oncology Follow Up Visit  Scott [name] [id number] 07-Nov-1935 77 y.o. 08/26/2012 8:35 AM Michiel Sites, MDKohut, Zollie Beckers, MD   Principle Diagnosis:  77 year old gentleman with: 1. Inguinal and upper thigh enlarged lymph nodes bilaterally, likely reactive.  2. History of prostate cancer: Again is under watchful observation under the care of Dr. Margarita Grizzle  Interim History:  Scott [name] presents today for a follow up visit, he is a nice man presents for a follow up visit after his first visit in 08/14/2012 for the evaluation for possible enlarged lymph nodes. Since last visit, he has been doing well without complications. He had not reported any palpable adenopathy. Did not report any neck adenopathy. Denied any inguinal area adenopathy or any enlargements of any of his lymph nodes. He had not reported any constitutional symptoms. Did not report any weight loss or appetite changes. He still performs activities of daily living without any hindrance or decline.   Medications: I have reviewed the patient's current medications. Current outpatient prescriptions:amLODipine (NORVASC) 10 MG tablet, Take 10 mg by mouth daily., Disp: , Rfl: ;  aspirin 81 MG tablet, Take 325 mg by mouth daily. , Disp: , Rfl: ;  benazepril (LOTENSIN) 20 MG tablet, Take 20 mg by mouth daily., Disp: , Rfl: ;  clopidogrel (PLAVIX) 75 MG tablet, Take 75 mg by mouth daily., Disp: , Rfl: ;  Ferrous Gluconate (IRON) 240 (27 FE) MG TABS, Take 1 capsule by mouth daily., Disp: , Rfl:  finasteride (PROSCAR) 5 MG tablet, Take 5 mg by mouth daily., Disp: , Rfl: ;  furosemide (LASIX) 40 MG tablet, Take 40 mg by mouth as needed., Disp: , Rfl: ;  gabapentin (NEURONTIN) 300 MG capsule, Take 300 mg by mouth 2 (two) times daily., Disp: , Rfl: ;  Multiple Vitamin (MULTIVITAMIN) tablet, Take 1 tablet by mouth daily., Disp: , Rfl: ;  pravastatin (PRAVACHOL) 80 MG tablet, Take 40 mg by mouth daily., Disp: , Rfl:  ranitidine (ZANTAC) 150 MG  capsule, Take 150 mg by mouth 2 (two) times daily., Disp: , Rfl:   Allergies:  Allergies  Allergen Reactions  . Shellfish Allergy Nausea And Vomiting  . Sulfa Antibiotics Other (See Comments)    Feels fatigued and weak when taking sulfa    Past Medical History, Surgical history, Social history, and Family History were reviewed and updated.  Review of Systems: Constitutional:  Negative for fever, chills, night sweats, anorexia, weight loss, pain. Cardiovascular: no chest pain or dyspnea on exertion Respiratory: negative Neurological: negative Dermatological: negative ENT: negative Skin: Negative. Gastrointestinal: negative Genito-Urinary: negative Hematological and Lymphatic: negative Breast: negative Musculoskeletal: negative Remaining ROS negative. Physical Exam: There were no vitals taken for this visit. ECOG: 1 General appearance: alert Head: Normocephalic, without obvious abnormality, atraumatic Neck: no adenopathy, no carotid bruit, no JVD, supple, symmetrical, trachea midline and thyroid not enlarged, symmetric, no tenderness/mass/nodules Lymph nodes: Cervical, supraclavicular, and axillary nodes normal. Heart:regular rate and rhythm, S1, S2 normal, no murmur, click, rub or gallop Lung:chest clear, no wheezing, rales, normal symmetric air entry Abdomin: soft, non-tender, without masses or organomegaly EXT:no erythema, induration, or nodules   Lab Results: Lab Results  Component Value Date   WBC 8.3 08/12/2012   HGB 14.4 08/12/2012   HCT 41.9 08/12/2012   MCV 90.6 08/12/2012   PLT 205 08/12/2012     Chemistry      Component Value Date/Time   NA 143 08/12/2012 1324   K 4.1 08/12/2012 1324   CL 110* 08/12/2012  1324   CO2 24 08/12/2012 1324   BUN 27.0* 08/12/2012 1324   CREATININE 1.2 08/12/2012 1324      Component Value Date/Time   CALCIUM 9.3 08/12/2012 1324   ALKPHOS 77 08/12/2012 1324   AST 16 08/12/2012 1324   ALT 12 08/12/2012 1324   BILITOT 0.50 08/12/2012 1324        Radiological Studies: Ct Abdomen Pelvis W Contrast  08/24/2012  *RADIOLOGY REPORT*  Clinical Data: Prostate cancer.  Evaluate pelvic lymph nodes. Diagnosed 08/13.  No surgery or treatment.  Prior appendectomy.  CT ABDOMEN AND PELVIS WITH CONTRAST  Technique:  Multidetector CT imaging of the abdomen and pelvis was performed following the standard protocol during bolus administration of intravenous contrast.  Contrast: OMNIPAQUE IOHEXOL 300 MG/ML  SOLN  Comparison: 02/23/2012 lower extremity ultrasound.  Findings: Lung bases:  Clear lung bases.  Mild cardiomegaly with coronary artery atherosclerosis.  A small hiatal hernia.  Abdomen/pelvis:  Normal liver, spleen, distal stomach, pancreas. Small gallstones without acute cholecystitis or biliary ductal dilatation.  Normal adrenal glands.  Right renal cysts and too small to characterize lesions.  Small retroperitoneal nodes, none of which meet size criteria for pathologic enlargement.  A retrocaval node measures 7 mm on image 42/series 2. Normal terminal ileum.  Normal small bowel without abdominal ascites.  No pelvic adenopathy.  Right inguinal nodes measure up to 1.0 cm and left external iliac nodes measure up to 8 mm.  Not pathologic by size criteria.  Normal urinary bladder.  Moderate prostatomegaly. No significant free fluid.  Bones/Musculoskeletal:  Remote anterior 7th and 8th left rib fractures.  Disc bulges at L3-L4 and L5-S1.  IMPRESSION: 1.  No pelvic adenopathy.  Mildly prominent nodes within the right inguinal region and along the left external iliac chain.  None meet size criteria for pathologic enlargement. 2.  Prostatomegaly, without evidence of metastatic disease. 3.  Cholelithiasis.   Original Report Authenticated By: Jeronimo Greaves, M.D.      Impression and Plan:  This is a pleasant 78 year old gentleman with the following issues:  1. Inguinal and upper thigh enlarged lymph nodes bilaterally: CT scan results reviewed with [name]  today. It appears that these enlarged lymph nodes are reactive in nature and do not require any further intervention.   2. History of prostate cancer: Again is under watchful observation under the care of Dr. Margarita Grizzle. His PSA is under reasonable control. He is scheduled for a repeat biopsy.   3. Follow up: will be as needed.    [facility], MD 1/17/20148:35 AM

## 2014-11-19 ENCOUNTER — Other Ambulatory Visit: Payer: Self-pay | Admitting: Cardiology

## 2014-11-19 DIAGNOSIS — I872 Venous insufficiency (chronic) (peripheral): Secondary | ICD-10-CM

## 2014-11-27 ENCOUNTER — Ambulatory Visit
Admission: RE | Admit: 2014-11-27 | Discharge: 2014-11-27 | Disposition: A | Discharge status: Home / Self Care | Payer: Medicare Other | Source: Ambulatory Visit | Attending: Cardiology | Admitting: Cardiology

## 2014-11-27 DIAGNOSIS — I872 Venous insufficiency (chronic) (peripheral): Secondary | ICD-10-CM

## 2014-11-27 NOTE — Consult Note (Signed)
Chief Complaint: Chief Complaint  Patient presents with  . Advice Only    Consult for Venous Insufficiency of lower extremites    Referring Physician(s): Ganji,Jay  History of Present Illness: Scott [name] is a 79 y.o. male known to our service from previous evaluation 02/23/2012. He has  a known history of coronary artery disease. He had a myocardial infarction in 2007 and underwent cardiac catheterization and stent placement. He complains of lower extremity swelling for many years. His main complaint is pain in the knee region, left worse than right. He also complains of some tiredness in the lower Extremities.  He has been wearing thigh high compression stockings which help with some of the swelling but do not significantly change the pain.Gabapentin  has improved the Pain . He still continues to take some Tylenol sporadically for symptomatic relief of his pain. At this time, he continues to exercise on a regular basis but his walking is limited due to lower extremity pain and symptoms.  Past Medical History  Diagnosis Date  . Arthritis 2004  . Myocardial infarction   . Coronary artery disease 2007    cardiac cath w/ angioplasty & stent placement x 1  . Sleep apnea 2001  . Hypertension   . Hyperlipidemia   . Acid reflux   . Venous insufficiency   . Enlarged prostate   . Adenomatous colon polyp 01/2002    Past Surgical History  Procedure Laterality Date  . Appendectomy  1989  . Angioplasty  09/18/2005    1 stent placed  . Rotator cuff repair  2004    right  . Tonsillectomy and adenoidectomy  1950  . Vasectomy  1970  . Prostate biopsy  12/30/2000    Allergies: Shellfish allergy and Sulfa antibiotics  Medications: Prior to Admission medications   Medication Sig Start Date End Date Taking? Authorizing Provider  alfuzosin (UROXATRAL) 10 MG 24 hr tablet Take 10 mg by mouth at bedtime.   Yes Historical Provider, MD  amLODipine (NORVASC) 10 MG tablet  Take 10 mg by mouth daily.   Yes Historical Provider, MD  aspirin 81 MG tablet Take 325 mg by mouth daily.    Yes Historical Provider, MD  atorvastatin (LIPITOR) 20 MG tablet Take 20 mg by mouth daily.   Yes Historical Provider, MD  benazepril (LOTENSIN) 20 MG tablet Take 20 mg by mouth daily.   Yes Historical Provider, MD  calcium-vitamin D (OSCAL WITH D) 500-200 MG-UNIT per tablet Take 1 tablet by mouth.   Yes Historical Provider, MD  cetirizine (ZYRTEC) 10 MG tablet Take 10 mg by mouth daily.   Yes Historical Provider, MD  Cholecalciferol (VITAMIN D3) 2000 UNITS CHEW Chew by mouth.   Yes Historical Provider, MD  clopidogrel (PLAVIX) 75 MG tablet Take 75 mg by mouth daily.   Yes Historical Provider, MD  Ferrous Gluconate (IRON) 240 (27 FE) MG TABS Take 1 capsule by mouth daily.   Yes Historical Provider, MD  finasteride (PROSCAR) 5 MG tablet Take 5 mg by mouth daily.   Yes Historical Provider, MD  furosemide (LASIX) 40 MG tablet Take 40 mg by mouth as needed.   Yes Historical Provider, MD  gabapentin (NEURONTIN) 300 MG capsule Take 300 mg by mouth 2 (two) times daily.   Yes Historical Provider, MD  Multiple Vitamin (MULTIVITAMIN) tablet Take 1 tablet by mouth daily.   Yes Historical Provider, MD  pantoprazole (PROTONIX) 20 MG tablet Take 20 mg by mouth daily.  Yes Historical Provider, MD  ranitidine (ZANTAC) 150 MG capsule Take 150 mg by mouth 2 (two) times daily.   Yes Historical Provider, MD  sildenafil (VIAGRA) 100 MG tablet Take 100 mg by mouth daily as needed for erectile dysfunction.   Yes Historical Provider, MD  triamcinolone (NASACORT) 55 MCG/ACT AERO nasal inhaler Place 2 sprays into the nose daily.   Yes Historical Provider, MD  vitamin B-12 (CYANOCOBALAMIN) 1000 MCG tablet Take 1,000 mcg by mouth daily.   Yes Historical Provider, MD  pravastatin (PRAVACHOL) 80 MG tablet Take 40 mg by mouth daily.    Historical Provider, MD     Family History  Problem Relation Age of Onset  .  Prostate cancer Brother   . Diabetes Father   . Diabetes Sister   . Diabetes Brother   . Heart disease Father   . Heart disease Mother   . Heart disease Sister   . Heart disease Brother     History   Social History  . Marital Status: Married    Spouse Name: N/A  . Number of Children: 4  . Years of Education: N/A   Occupational History  . retired Financial planner    Social History Main Topics  . Smoking status: Never Smoker   . Smokeless tobacco: Never Used  . Alcohol Use: No  . Drug Use: No  . Sexual Activity: Not on file   Other Topics Concern  . Not on file   Social History Narrative  . No narrative on file    ECOG Status: 1 - Symptomatic but completely ambulatory  Review of Systems: A 12 point ROS discussed and pertinent positives are indicated in the HPI above.  All other systems are negative.  Review of Systems  Vital Signs: BP 115/63 mmHg  Pulse 52  Temp(Src) 97.7 F (36.5 C) (Oral)  Resp 14  Ht 5\' 6"  (1.676 m)  Wt 165 lb (74.844 kg)  BMI 26.64 kg/m2  SpO2 96%  Physical Exam  Mallampati Score:     Imaging: US Venous Img Lower Bilateral  11/27/2014   CLINICAL DATA:  Bilateral leg pain and swelling.  EXAM: BILATERAL LOWER EXTREMITY VENOUS DOPPLER ULTRASOUND  COMPARISON:  02/23/2012  TECHNIQUE: TECHNIQUE Gray-scale sonography with compression as well as color and duplex Doppler ultrasound were performed to evaluate the deep venous system from the level of the common femoral vein through the popliteal and proximal calf veins. Standing gray-scale sonography and duplex Doppler evaluation of the superficial venous system with augmentation was performed.  FINDINGS: The lower extremity deep venous system demonstrates normal compressibility, phasicity, and augmentation. No evidence of DVT.  The greater saphenous and short saphenous veins are normal in caliber with normal compressibility. No evidence of valvular incompetence or reflux on provocative maneuvers.   IMPRESSION: 1. Normal bilateral lower extremity deep venous system. No evidence of DVT. 2. No evidence of superficial venous valvular incompetence or reflux bilaterally.   Electronically Signed   By: Lucrezia Europe M.D.   On: 11/27/2014 16:42    Labs:  CBC: No results for input(s): WBC, HGB, HCT, PLT in the last 8760 hours.  COAGS: No results for input(s): INR, APTT in the last 8760 hours.  BMP: No results for input(s): NA, K, CL, CO2, GLUCOSE, BUN, CALCIUM, CREATININE, GFRNONAA, GFRAA in the last 8760 hours.  Invalid input(s): CMP  LIVER FUNCTION TESTS: No results for input(s): BILITOT, AST, ALT, ALKPHOS, PROT, ALBUMIN in the last 8760 hours.  TUMOR MARKERS: No results for  input(s): AFPTM, CEA, CA199, CHROMGRNA in the last 8760 hours.  Assessment and Plan: My impression is that his lower extremity symptoms are not related to venous valvular incompetence or reflux. Both the superficial and deep systems are essentially normal and unchanged from previous exam. We reviewed the findings. We reviewed differential diagnosis for lower extremity pain and swelling. The knee pain is probably more an orthopedic issue. I encouraged continued use of his graduated thigh high compression hose to help limit his lower extremity edema. Thank you for this interesting consult.  I greatly enjoyed meeting Tafari Humiston Swaim. Please let me know if I can be of further assistance.  Signed: Bobi Daudelin III, DAYNE Khing Belcher 11/27/2014, 5:08 PM   I spent a total of    25 Minutes in face to face in clinical consultation, greater than 50% of which was counseling/coordinating care for bilateral lower extremity swelling and pain.

## 2015-05-03 ENCOUNTER — Ambulatory Visit
Admission: RE | Admit: 2015-05-03 | Discharge: 2015-05-03 | Disposition: A | Discharge status: Home / Self Care | Payer: Medicare Other | Source: Ambulatory Visit | Attending: Cardiology | Admitting: Cardiology

## 2015-05-03 ENCOUNTER — Other Ambulatory Visit: Payer: Self-pay | Admitting: Cardiology

## 2015-05-03 DIAGNOSIS — R0602 Shortness of breath: Secondary | ICD-10-CM

## 2016-01-29 ENCOUNTER — Inpatient Hospital Stay (HOSPITAL_COMMUNITY)
Admission: EM | Admit: 2016-01-29 | Discharge: 2016-01-31 | DRG: 872 | Disposition: A | Discharge status: Home / Self Care | Payer: Medicare Other | Attending: Internal Medicine | Admitting: Internal Medicine

## 2016-01-29 ENCOUNTER — Encounter (HOSPITAL_COMMUNITY): Payer: Self-pay | Admitting: Emergency Medicine

## 2016-01-29 ENCOUNTER — Emergency Department (HOSPITAL_COMMUNITY): Payer: Medicare Other

## 2016-01-29 ENCOUNTER — Other Ambulatory Visit: Payer: Self-pay

## 2016-01-29 DIAGNOSIS — Z955 Presence of coronary angioplasty implant and graft: Secondary | ICD-10-CM | POA: Diagnosis not present

## 2016-01-29 DIAGNOSIS — E785 Hyperlipidemia, unspecified: Secondary | ICD-10-CM | POA: Diagnosis present

## 2016-01-29 DIAGNOSIS — B9620 Unspecified Escherichia coli [E. coli] as the cause of diseases classified elsewhere: Secondary | ICD-10-CM | POA: Diagnosis present

## 2016-01-29 DIAGNOSIS — Z7982 Long term (current) use of aspirin: Secondary | ICD-10-CM

## 2016-01-29 DIAGNOSIS — N390 Urinary tract infection, site not specified: Secondary | ICD-10-CM | POA: Diagnosis present

## 2016-01-29 DIAGNOSIS — K219 Gastro-esophageal reflux disease without esophagitis: Secondary | ICD-10-CM | POA: Diagnosis present

## 2016-01-29 DIAGNOSIS — I252 Old myocardial infarction: Secondary | ICD-10-CM | POA: Diagnosis not present

## 2016-01-29 DIAGNOSIS — I119 Hypertensive heart disease without heart failure: Secondary | ICD-10-CM | POA: Diagnosis present

## 2016-01-29 DIAGNOSIS — I959 Hypotension, unspecified: Secondary | ICD-10-CM | POA: Diagnosis present

## 2016-01-29 DIAGNOSIS — Z7902 Long term (current) use of antithrombotics/antiplatelets: Secondary | ICD-10-CM | POA: Diagnosis not present

## 2016-01-29 DIAGNOSIS — Z91013 Allergy to seafood: Secondary | ICD-10-CM

## 2016-01-29 DIAGNOSIS — R112 Nausea with vomiting, unspecified: Secondary | ICD-10-CM | POA: Diagnosis not present

## 2016-01-29 DIAGNOSIS — A419 Sepsis, unspecified organism: Secondary | ICD-10-CM | POA: Diagnosis present

## 2016-01-29 DIAGNOSIS — Z882 Allergy status to sulfonamides status: Secondary | ICD-10-CM | POA: Diagnosis not present

## 2016-01-29 DIAGNOSIS — R0609 Other forms of dyspnea: Secondary | ICD-10-CM

## 2016-01-29 DIAGNOSIS — R059 Cough, unspecified: Secondary | ICD-10-CM | POA: Diagnosis present

## 2016-01-29 DIAGNOSIS — R05 Cough: Secondary | ICD-10-CM | POA: Diagnosis present

## 2016-01-29 DIAGNOSIS — I2510 Atherosclerotic heart disease of native coronary artery without angina pectoris: Secondary | ICD-10-CM | POA: Diagnosis present

## 2016-01-29 HISTORY — DX: Malignant neoplasm of prostate: C61

## 2016-01-29 HISTORY — DX: Reserved for inherently not codable concepts without codable children: IMO0001

## 2016-01-29 LAB — URINALYSIS, ROUTINE W REFLEX MICROSCOPIC
BILIRUBIN URINE: NEGATIVE
Glucose, UA: NEGATIVE mg/dL
Ketones, ur: NEGATIVE mg/dL
NITRITE: POSITIVE — AB
PH: 7.5 (ref 5.0–8.0)
Protein, ur: NEGATIVE mg/dL
SPECIFIC GRAVITY, URINE: 1.021 (ref 1.005–1.030)

## 2016-01-29 LAB — I-STAT CG4 LACTIC ACID, ED
LACTIC ACID, VENOUS: 0.89 mmol/L (ref 0.5–2.0)
Lactic Acid, Venous: 2.02 mmol/L (ref 0.5–2.0)

## 2016-01-29 LAB — CBC WITH DIFFERENTIAL/PLATELET
BASOS PCT: 0 %
Basophils Absolute: 0 10*3/uL (ref 0.0–0.1)
EOS ABS: 0 10*3/uL (ref 0.0–0.7)
Eosinophils Relative: 0 %
HEMATOCRIT: 41.1 % (ref 39.0–52.0)
HEMOGLOBIN: 13.2 g/dL (ref 13.0–17.0)
LYMPHS ABS: 0.8 10*3/uL (ref 0.7–4.0)
Lymphocytes Relative: 5 %
MCH: 29.4 pg (ref 26.0–34.0)
MCHC: 32.1 g/dL (ref 30.0–36.0)
MCV: 91.5 fL (ref 78.0–100.0)
MONOS PCT: 5 %
Monocytes Absolute: 0.7 10*3/uL (ref 0.1–1.0)
NEUTROS ABS: 13.2 10*3/uL — AB (ref 1.7–7.7)
NEUTROS PCT: 90 %
Platelets: 151 10*3/uL (ref 150–400)
RBC: 4.49 MIL/uL (ref 4.22–5.81)
RDW: 13.6 % (ref 11.5–15.5)
WBC: 14.7 10*3/uL — AB (ref 4.0–10.5)

## 2016-01-29 LAB — COMPREHENSIVE METABOLIC PANEL
ALBUMIN: 3.8 g/dL (ref 3.5–5.0)
ALT: 21 U/L (ref 17–63)
ANION GAP: 9 (ref 5–15)
AST: 22 U/L (ref 15–41)
Alkaline Phosphatase: 67 U/L (ref 38–126)
BUN: 33 mg/dL — AB (ref 6–20)
CHLORIDE: 107 mmol/L (ref 101–111)
CO2: 21 mmol/L — ABNORMAL LOW (ref 22–32)
Calcium: 9.4 mg/dL (ref 8.9–10.3)
Creatinine, Ser: 1.35 mg/dL — ABNORMAL HIGH (ref 0.61–1.24)
GFR calc Af Amer: 56 mL/min — ABNORMAL LOW (ref >60)
GFR calc non Af Amer: 49 mL/min — ABNORMAL LOW (ref >60)
GLUCOSE: 123 mg/dL — AB (ref 65–99)
POTASSIUM: 4.2 mmol/L (ref 3.5–5.1)
SODIUM: 137 mmol/L (ref 135–145)
Total Bilirubin: 1.3 mg/dL — ABNORMAL HIGH (ref 0.3–1.2)
Total Protein: 6.4 g/dL — ABNORMAL LOW (ref 6.5–8.1)

## 2016-01-29 LAB — URINE MICROSCOPIC-ADD ON

## 2016-01-29 LAB — MRSA PCR SCREENING: MRSA by PCR: NEGATIVE

## 2016-01-29 LAB — BRAIN NATRIURETIC PEPTIDE: B Natriuretic Peptide: 54.4 pg/mL (ref 0.0–100.0)

## 2016-01-29 MED ORDER — SODIUM CHLORIDE 0.9 % IV BOLUS (SEPSIS)
1000.0000 mL | Freq: Once | INTRAVENOUS | Status: AC
  Administered 2016-01-29: 1000 mL via INTRAVENOUS

## 2016-01-29 MED ORDER — SODIUM CHLORIDE 0.9 % IV SOLN
INTRAVENOUS | Status: DC
  Administered 2016-01-29 – 2016-01-31 (×4): via INTRAVENOUS

## 2016-01-29 MED ORDER — DEXTROSE 5 % IV SOLN: 2.0000 g | Freq: Once | INTRAVENOUS | Status: DC

## 2016-01-29 MED ORDER — SODIUM CHLORIDE 0.9% FLUSH
3.0000 mL | Freq: Two times a day (BID) | INTRAVENOUS | Status: DC
  Administered 2016-01-30 (×2): 3 mL via INTRAVENOUS

## 2016-01-29 MED ORDER — DEXTROSE 5 % IV SOLN
1.0000 g | INTRAVENOUS | Status: DC
  Administered 2016-01-29 – 2016-01-31 (×3): 1 g via INTRAVENOUS
  Filled 2016-01-29 (×3): qty 10

## 2016-01-29 MED ORDER — PANTOPRAZOLE SODIUM 40 MG PO TBEC
40.0000 mg | DELAYED_RELEASE_TABLET | Freq: Every day | ORAL | Status: DC
  Administered 2016-01-30: 40 mg via ORAL
  Filled 2016-01-29: qty 1

## 2016-01-29 MED ORDER — SODIUM CHLORIDE 0.9 % IV BOLUS (SEPSIS)
500.0000 mL | Freq: Once | INTRAVENOUS | Status: AC
  Administered 2016-01-29: 500 mL via INTRAVENOUS

## 2016-01-29 MED ORDER — ACETAMINOPHEN 650 MG RE SUPP: 650.0000 mg | Freq: Four times a day (QID) | RECTAL | Status: DC | PRN

## 2016-01-29 MED ORDER — FINASTERIDE 5 MG PO TABS
5.0000 mg | ORAL_TABLET | Freq: Every day | ORAL | Status: DC
  Administered 2016-01-29 – 2016-01-31 (×3): 5 mg via ORAL
  Filled 2016-01-29 (×3): qty 1

## 2016-01-29 MED ORDER — MONTELUKAST SODIUM 10 MG PO TABS
10.0000 mg | ORAL_TABLET | Freq: Every morning | ORAL | Status: DC
  Administered 2016-01-30 – 2016-01-31 (×2): 10 mg via ORAL
  Filled 2016-01-29 (×2): qty 1

## 2016-01-29 MED ORDER — ASPIRIN EC 81 MG PO TBEC
81.0000 mg | DELAYED_RELEASE_TABLET | Freq: Every day | ORAL | Status: DC
  Administered 2016-01-30 – 2016-01-31 (×2): 81 mg via ORAL
  Filled 2016-01-29 (×2): qty 1

## 2016-01-29 MED ORDER — CLOPIDOGREL BISULFATE 75 MG PO TABS
75.0000 mg | ORAL_TABLET | ORAL | Status: DC
  Administered 2016-01-30: 75 mg via ORAL
  Filled 2016-01-29: qty 1

## 2016-01-29 MED ORDER — ACETAMINOPHEN 325 MG PO TABS
650.0000 mg | ORAL_TABLET | Freq: Four times a day (QID) | ORAL | Status: DC | PRN
  Administered 2016-01-29: 650 mg via ORAL
  Filled 2016-01-29 (×2): qty 2

## 2016-01-29 MED ORDER — GABAPENTIN 300 MG PO CAPS
300.0000 mg | ORAL_CAPSULE | Freq: Two times a day (BID) | ORAL | Status: DC
  Administered 2016-01-29 – 2016-01-31 (×4): 300 mg via ORAL
  Filled 2016-01-29 (×4): qty 1

## 2016-01-29 MED ORDER — ACETAMINOPHEN 325 MG PO TABS
650.0000 mg | ORAL_TABLET | Freq: Once | ORAL | Status: AC
  Administered 2016-01-29: 650 mg via ORAL
  Filled 2016-01-29: qty 2

## 2016-01-29 MED ORDER — ENOXAPARIN SODIUM 30 MG/0.3ML ~~LOC~~ SOLN
30.0000 mg | SUBCUTANEOUS | Status: DC
  Administered 2016-01-29: 30 mg via SUBCUTANEOUS
  Filled 2016-01-29: qty 0.3

## 2016-01-29 NOTE — ED Notes (Signed)
Pt states he had a sudden onset on lower back pain and chills at 0900. Pt states last urination was was a 0600 and since then has felt like he needs to urination but unable to.

## 2016-01-29 NOTE — H&P (Signed)
History and Physical    [name] [id number] DOB: May 03, 1936 DOA: 01/29/2016  PCP: Dwan Bolt, MD Patient coming from: home   Chief Complaint: fever and chills.   HPI: Scott [name] is a 80 y.o. male with medical history significant of  Hypertension, hyperlipidemia, GERD, was brougth in by his sister and wife to ED FOR  Chills, rigors, fever and generalized weakness since this morning. He reports waking up normal, but after breakfast, he reports having the above symptoms. He also reports ome back pain and nausea, vomiting. He denies diarrhea or syncope. He denies any urinary symptoms.   ED Course: he was starte don IV antibiotics for UTI, sepsis protocol initiated.  Blood cultures and urine cultures are ordered.   Review of Systems: As per HPI otherwise 10 point review of systems negative.    Past Medical History  Diagnosis Date  . Arthritis 2004  . Myocardial infarction ([place])   . Coronary artery disease 2007    cardiac cath w/ angioplasty & stent placement x 1  . Sleep apnea 2001  . Hypertension   . Hyperlipidemia   . Acid reflux   . Venous insufficiency   . Enlarged prostate   . Adenomatous colon polyp 01/2002    Past Surgical History  Procedure Laterality Date  . Appendectomy  1989  . Angioplasty  09/18/2005    1 stent placed  . Rotator cuff repair  2004    right  . Tonsillectomy and adenoidectomy  1950  . Vasectomy  1970  . Prostate biopsy  12/30/2000     reports that he has never smoked. He has never used smokeless tobacco. He reports that he does not drink alcohol or use illicit drugs.  Allergies  Allergen Reactions  . Shellfish Allergy Nausea And Vomiting  . Sulfa Antibiotics Other (See Comments)    Feels fatigued and weak when taking sulfa    Family History  Problem Relation Age of Onset  . Prostate cancer Brother   . Diabetes Father   . Diabetes Sister   . Diabetes Brother   . Heart disease Father   . Heart disease Mother   . Heart  disease Sister   . Heart disease Brother      Prior to Admission medications   Medication Sig Start Date End Date Taking? Authorizing Provider  alfuzosin (UROXATRAL) 10 MG 24 hr tablet Take 10 mg by mouth at bedtime.   Yes Historical Provider, MD  aspirin 81 MG tablet Take 81 mg by mouth daily.    Yes Historical Provider, MD  atorvastatin (LIPITOR) 40 MG tablet Take 40 mg by mouth every evening. 12/20/15  Yes Historical Provider, MD  benazepril (LOTENSIN) 20 MG tablet Take 20 mg by mouth daily.   Yes Historical Provider, MD  cetirizine (ZYRTEC) 10 MG tablet Take 10 mg by mouth daily.   Yes Historical Provider, MD  clopidogrel (PLAVIX) 75 MG tablet Take 75 mg by mouth every other day.    Yes Historical Provider, MD  eplerenone (INSPRA) 25 MG tablet Take 25 mg by mouth daily.   Yes Historical Provider, MD  ferrous sulfate 325 (65 FE) MG EC tablet Take 325 mg by mouth daily with breakfast.   Yes Historical Provider, MD  finasteride (PROSCAR) 5 MG tablet Take 5 mg by mouth daily.   Yes Historical Provider, MD  gabapentin (NEURONTIN) 300 MG capsule Take 300 mg by mouth 2 (two) times daily.   Yes Historical Provider, MD  montelukast (SINGULAIR) 10  MG tablet Take 10 mg by mouth every morning.   Yes Historical Provider, MD  Multiple Vitamin (MULTIVITAMIN) tablet Take 1 tablet by mouth daily.   Yes Historical Provider, MD  pantoprazole (PROTONIX) 20 MG tablet Take 40 mg by mouth daily.    Yes Historical Provider, MD  sildenafil (VIAGRA) 100 MG tablet Take 100 mg by mouth daily as needed for erectile dysfunction.   Yes Historical Provider, MD  calcium-vitamin D (OSCAL WITH D) 500-200 MG-UNIT per tablet Take 1 tablet by mouth.    Historical Provider, MD    Physical Exam: Filed Vitals:   01/29/16 1700 01/29/16 1715 01/29/16 1730 01/29/16 1745  BP: 99/53 95/38 98/53  90/56  Pulse: 85 84 81 79  Temp:      TempSrc:      Resp: 21 22 19 21   Weight:      SpO2: 93% 94% 94% 94%      Constitutional:  NAD, calm, comfortable Filed Vitals:   01/29/16 1700 01/29/16 1715 01/29/16 1730 01/29/16 1745  BP: 99/53 95/38 98/53  90/56  Pulse: 85 84 81 79  Temp:      TempSrc:      Resp: 21 22 19 21   Weight:      SpO2: 93% 94% 94% 94%   Eyes: PERRL, lids and conjunctivae normal ENMT: Mucous membranes are moist. Posterior pharynx clear of any exudate or lesions.Normal dentition.  Neck: normal, supple, no masses, no thyromegaly Respiratory: clear to auscultation bilaterally, no wheezing, no crackles. Normal respiratory effort. No accessory muscle use.  Cardiovascular: Regular rate and rhythm, no murmurs / rubs / gallops. No extremity edema. 2+ pedal pulses. No carotid bruits.  Abdomen: no tenderness, no masses palpated. No hepatosplenomegaly. Bowel sounds positive.  Musculoskeletal: no clubbing / cyanosis. No joint deformity upper and lower extremities. Good ROM, no contractures. Normal muscle tone.   Neurologic: CN 2-12 grossly intact. Sensation intact, DTR normal. Strength 5/5 in all 4.  Psychiatric: Normal judgment and insight. Alert and oriented x 3. Normal mood.    Labs on Admission: I have personally reviewed following labs and imaging studies  CBC:  Recent Labs Lab 01/29/16 1334  WBC 14.7*  NEUTROABS 13.2*  HGB 13.2  HCT 41.1  MCV 91.5  PLT 123XX123   Basic Metabolic Panel:  Recent Labs Lab 01/29/16 1334  NA 137  K 4.2  CL 107  CO2 21*  GLUCOSE 123*  BUN 33*  CREATININE 1.35*  CALCIUM 9.4   GFR: CrCl cannot be calculated (Unknown ideal weight.). Liver Function Tests:  Recent Labs Lab 01/29/16 1334  AST 22  ALT 21  ALKPHOS 67  BILITOT 1.3*  PROT 6.4*  ALBUMIN 3.8   No results for input(s): LIPASE, AMYLASE in the last 168 hours. No results for input(s): AMMONIA in the last 168 hours. Coagulation Profile: No results for input(s): INR, PROTIME in the last 168 hours. Cardiac Enzymes: No results for input(s): CKTOTAL, CKMB, CKMBINDEX, TROPONINI in the last 168  hours. BNP (last 3 results) No results for input(s): PROBNP in the last 8760 hours. HbA1C: No results for input(s): HGBA1C in the last 72 hours. CBG: No results for input(s): GLUCAP in the last 168 hours. Lipid Profile: No results for input(s): CHOL, HDL, LDLCALC, TRIG, CHOLHDL, LDLDIRECT in the last 72 hours. Thyroid Function Tests: No results for input(s): TSH, T4TOTAL, FREET4, T3FREE, THYROIDAB in the last 72 hours. Anemia Panel: No results for input(s): VITAMINB12, FOLATE, FERRITIN, TIBC, IRON, RETICCTPCT in the last 72 hours. Urine analysis:  Component Value Date/Time   COLORURINE YELLOW 01/29/2016 1436   APPEARANCEUR CLOUDY* 01/29/2016 1436   LABSPEC 1.021 01/29/2016 1436   PHURINE 7.5 01/29/2016 1436   GLUCOSEU NEGATIVE 01/29/2016 1436   HGBUR TRACE* 01/29/2016 1436   BILIRUBINUR NEGATIVE 01/29/2016 1436   KETONESUR NEGATIVE 01/29/2016 1436   PROTEINUR NEGATIVE 01/29/2016 1436   NITRITE POSITIVE* 01/29/2016 1436   LEUKOCYTESUR LARGE* 01/29/2016 1436   Sepsis Labs: !!!!!!!!!!!!!!!!!!!!!!!!!!!!!!!!!!!!!!!!!!!! @LABRCNTIP (procalcitonin:4,lacticidven:4) )No results found for this or any previous visit (from the past 240 hour(s)).   Radiological Exams on Admission: Dg Chest 2 View  01/29/2016  CLINICAL DATA:  Intermittent cough for 2 months. Chills, fever and weakness today. EXAM: CHEST  2 VIEW COMPARISON:  PA and lateral chest 01/08/2016 and 05/03/2015. FINDINGS: There is cardiomegaly. Right aortic arch is noted. Lungs are clear. No pneumothorax or pleural effusion. IMPRESSION: No acute abnormality. Cardiomegaly and right aortic arch as seen on prior exams. Electronically Signed   By: Inge Rise M.D.   On: 01/29/2016 15:03    EKG: Independently reviewed. Sinus rhythm.   Assessment/Plan Active Problems:   Sepsis ([place])    Sepsis from urinary tract infection: Admit to step down, start on broad spectrum antibiotics with IV Rocephin.  Urine and blood cultures  ordered and pending.  Lactic acid normalized.  Was given 2500 ml fluid bolus followed by maintenance fluids.  Currently asymptomatic.   Hypotension: Improving. Holding anti hypertensive medications.      DVT prophylaxis: (Lovenox/) Code Status: (Full/ Family Communication: discussed with sister at bedside.  Disposition Plan:  3 TO 4 DAYS.  Consults called: NONE Admission status:  (inpatient SDU   Montel Vanderhoof MD [facility] Pager [phone]  If 7PM-7AM, please contact night-coverage [email or web address] Password [facility]  01/29/2016, 6:23 PM

## 2016-01-29 NOTE — Progress Notes (Signed)
Received pt from ED admitted to room 2C06  Alert/oriented CHG bath done and oriented to room.

## 2016-01-29 NOTE — ED Provider Notes (Signed)
CSN: [id number]     Arrival date & time 01/29/16  1321 History   First MD Initiated Contact with Patient 01/29/16 1442     Chief Complaint  Patient presents with  . Back Pain  . Fever    SALIOU MCILVAIN is a 80 y.o. male who presents to the ED complaining of fever, chills, painful urination and low back pain starting today. The patient also reports he had some nausea and 2 episodes of vomiting earlier this morning. He denies current nausea or vomiting. He denies any abdominal pain. He reports he noticed he had a fever this morning. He has taken nothing for treatment of his symptoms today. He reports some cough over the past several months that has not worsened. He denies history of urinary tract infections. No recent antibiotic use, no recent hospital admissions.  He denies any pain to his penis or his testicles. He denies abdominal pain, diarrhea, hematemesis, hematochezia, hematuria, chest pain, shortness of breath, or rashes.   The history is provided by the patient. No language interpreter was used.    Past Medical History  Diagnosis Date  . Arthritis 2004  . Myocardial infarction ([place])   . Coronary artery disease 2007    cardiac cath w/ angioplasty & stent placement x 1  . Sleep apnea 2001  . Hypertension   . Hyperlipidemia   . Acid reflux   . Venous insufficiency   . Enlarged prostate   . Adenomatous colon polyp 01/2002   Past Surgical History  Procedure Laterality Date  . Appendectomy  1989  . Angioplasty  09/18/2005    1 stent placed  . Rotator cuff repair  2004    right  . Tonsillectomy and adenoidectomy  1950  . Vasectomy  1970  . Prostate biopsy  12/30/2000   Family History  Problem Relation Age of Onset  . Prostate cancer Brother   . Diabetes Father   . Diabetes Sister   . Diabetes Brother   . Heart disease Father   . Heart disease Mother   . Heart disease Sister   . Heart disease Brother    Social History  Substance Use Topics  . Smoking status: Never Smoker    . Smokeless tobacco: Never Used  . Alcohol Use: No    Review of Systems  Constitutional: Positive for fever, chills and fatigue.  HENT: Negative for congestion and sore throat.   Eyes: Negative for visual disturbance.  Respiratory: Negative for cough and shortness of breath.   Cardiovascular: Negative for chest pain.  Gastrointestinal: Positive for nausea and vomiting. Negative for abdominal pain, diarrhea and blood in stool.  Genitourinary: Positive for dysuria and decreased urine volume. Negative for urgency, frequency, hematuria, discharge, penile swelling, scrotal swelling, difficulty urinating, genital sores, penile pain and testicular pain.  Musculoskeletal: Positive for back pain and arthralgias. Negative for neck pain.  Skin: Negative for rash.  Neurological: Negative for syncope, light-headedness and headaches.      Allergies  Shellfish allergy and Sulfa antibiotics  Home Medications   Prior to Admission medications   Medication Sig Start Date End Date Taking? Authorizing Provider  alfuzosin (UROXATRAL) 10 MG 24 hr tablet Take 10 mg by mouth at bedtime.   Yes Historical Provider, MD  aspirin 81 MG tablet Take 81 mg by mouth daily.    Yes Historical Provider, MD  atorvastatin (LIPITOR) 40 MG tablet Take 40 mg by mouth every evening. 12/20/15  Yes Historical Provider, MD  benazepril (LOTENSIN) 20 MG  tablet Take 20 mg by mouth daily.   Yes Historical Provider, MD  cetirizine (ZYRTEC) 10 MG tablet Take 10 mg by mouth daily.   Yes Historical Provider, MD  clopidogrel (PLAVIX) 75 MG tablet Take 75 mg by mouth every other day.    Yes Historical Provider, MD  eplerenone (INSPRA) 25 MG tablet Take 25 mg by mouth daily.   Yes Historical Provider, MD  ferrous sulfate 325 (65 FE) MG EC tablet Take 325 mg by mouth daily with breakfast.   Yes Historical Provider, MD  finasteride (PROSCAR) 5 MG tablet Take 5 mg by mouth daily.   Yes Historical Provider, MD  gabapentin (NEURONTIN) 300 MG  capsule Take 300 mg by mouth 2 (two) times daily.   Yes Historical Provider, MD  montelukast (SINGULAIR) 10 MG tablet Take 10 mg by mouth every morning.   Yes Historical Provider, MD  Multiple Vitamin (MULTIVITAMIN) tablet Take 1 tablet by mouth daily.   Yes Historical Provider, MD  pantoprazole (PROTONIX) 20 MG tablet Take 40 mg by mouth daily.    Yes Historical Provider, MD  sildenafil (VIAGRA) 100 MG tablet Take 100 mg by mouth daily as needed for erectile dysfunction.   Yes Historical Provider, MD  calcium-vitamin D (OSCAL WITH D) 500-200 MG-UNIT per tablet Take 1 tablet by mouth.    Historical Provider, MD   BP 92/56 mmHg  Pulse 97  Temp(Src) 101 F (38.3 C) (Oral)  Resp 23  Wt 77.111 kg  SpO2 94% Physical Exam  Constitutional: He appears well-developed and well-nourished. No distress.  Nontoxic appearing.  HENT:  Head: Normocephalic and atraumatic.  Oropharynx is clear. Mucous membranes appear slightly dry.  Eyes: Conjunctivae are normal. Pupils are equal, round, and reactive to light. Right eye exhibits no discharge. Left eye exhibits no discharge.  Neck: Normal range of motion. Neck supple. No JVD present. No tracheal deviation present.  Cardiovascular: Normal rate, regular rhythm, normal heart sounds and intact distal pulses.  Exam reveals no gallop and no friction rub.   No murmur heard. Heart rate is 98. Good capillary refill to his bilateral distal fingertips.  Pulmonary/Chest: Effort normal and breath sounds normal. No stridor. No respiratory distress. He has no wheezes. He has no rales.  Lungs are clear to auscultation bilaterally.  Abdominal: Soft. Bowel sounds are normal. He exhibits no mass. There is no tenderness. There is no guarding.  Abdomen is soft and nontender to palpation. No CVA or flank tenderness.  Musculoskeletal: He exhibits no edema or tenderness.  No back edema or tenderness. No midline neck or back tenderness. No CVA or flank tenderness. No calf edema or  tenderness.  Lymphadenopathy:    He has no cervical adenopathy.  Neurological: He is alert. Coordination normal.  Skin: Skin is warm and dry. No rash noted. He is not diaphoretic. No erythema. No pallor.  Psychiatric: He has a normal mood and affect. His behavior is normal.  Nursing note and vitals reviewed.   ED Course  Procedures (including critical care time) Labs Review Labs Reviewed  COMPREHENSIVE METABOLIC PANEL - Abnormal; Notable for the following:    CO2 21 (*)    Glucose, Bld 123 (*)    BUN 33 (*)    Creatinine, Ser 1.35 (*)    Total Protein 6.4 (*)    Total Bilirubin 1.3 (*)    GFR calc non Af Amer 49 (*)    GFR calc Af Amer 56 (*)    All other components within normal  limits  CBC WITH DIFFERENTIAL/PLATELET - Abnormal; Notable for the following:    WBC 14.7 (*)    Neutro Abs 13.2 (*)    All other components within normal limits  URINALYSIS, ROUTINE W REFLEX MICROSCOPIC (NOT AT [facility]) - Abnormal; Notable for the following:    APPearance CLOUDY (*)    Hgb urine dipstick TRACE (*)    Nitrite POSITIVE (*)    Leukocytes, UA LARGE (*)    All other components within normal limits  URINE MICROSCOPIC-ADD ON - Abnormal; Notable for the following:    Squamous Epithelial / LPF 0-5 (*)    Bacteria, UA MANY (*)    All other components within normal limits  I-STAT CG4 LACTIC ACID, ED - Abnormal; Notable for the following:    Lactic Acid, Venous 2.02 (*)    All other components within normal limits  URINE CULTURE  CULTURE, BLOOD (ROUTINE X 2)  CULTURE, BLOOD (ROUTINE X 2)  BRAIN NATRIURETIC PEPTIDE  I-STAT CG4 LACTIC ACID, ED   Sepsis - Repeat Assessment  Performed at:    15:53  Vitals     Blood pressure 92/56, pulse 97, temperature 101 F (38.3 C), temperature source Oral, resp. rate 23, weight 77.111 kg, SpO2 94 %.  Heart:     Regular rate and rhythm  Lungs:    CTA  Capillary Refill:   <2 sec  Peripheral Pulse:   Radial pulse palpable  Skin:     Normal Color   Patient reports he is feeling much better. He agrees to admission.    Imaging Review Dg Chest 2 View  01/29/2016  CLINICAL DATA:  Intermittent cough for 2 months. Chills, fever and weakness today. EXAM: CHEST  2 VIEW COMPARISON:  PA and lateral chest 01/08/2016 and 05/03/2015. FINDINGS: There is cardiomegaly. Right aortic arch is noted. Lungs are clear. No pneumothorax or pleural effusion. IMPRESSION: No acute abnormality. Cardiomegaly and right aortic arch as seen on prior exams. Electronically Signed   By: Inge Rise M.D.   On: 01/29/2016 15:03   I have personally reviewed and evaluated these images and lab results as part of my medical decision-making.   EKG Interpretation   Date/Time:  Wednesday January 29 2016 15:07:46 EDT Ventricular Rate:  98 PR Interval:    QRS Duration: 88 QT Interval:  324 QTC Calculation: 414 R Axis:   2 Text Interpretation:  Sinus rhythm Inferior infarct, old Consider anterior  infarct agree. no STEMI. no old comparison. Confirmed by Johnney Killian, MD,  [place] [phone]) on 01/29/2016 4:11:43 PM      Filed Vitals:   01/29/16 1327 01/29/16 1445 01/29/16 1520  BP: 111/83 95/59 92/56   Pulse: 106 98 97  Temp: 102.9 F (39.4 C)  101 F (38.3 C)  TempSrc: Oral  Oral  Resp: 20 23 23   Weight:  77.111 kg   SpO2: 99% 95% 94%     MDM   Meds given in ED:  Medications  cefTRIAXone (ROCEPHIN) 1 g in dextrose 5 % 50 mL IVPB (0 g Intravenous Stopped 01/29/16 1559)  sodium chloride 0.9 % bolus 1,000 mL (1,000 mLs Intravenous New Bag/Given 01/29/16 1506)    And  sodium chloride 0.9 % bolus 1,000 mL (1,000 mLs Intravenous New Bag/Given 01/29/16 1535)    And  sodium chloride 0.9 % bolus 500 mL (500 mLs Intravenous New Bag/Given 01/29/16 1615)  acetaminophen (TYLENOL) tablet 650 mg (650 mg Oral Given 01/29/16 1532)    New Prescriptions   No medications on file  Final diagnoses:  Sepsis, due to unspecified organism [facility])  UTI (lower urinary tract infection)    This is a 80 y.o. male who presents to the ED complaining of fever, chills, painful urination and low back pain starting today. The patient also reports he had some nausea and 2 episodes of vomiting earlier this morning. He denies current nausea or vomiting. He denies any abdominal pain. He reports he noticed he had a fever this morning. He has taken nothing for treatment of his symptoms today. He reports some cough over the past several months that has not worsened. He denies history of urinary tract infections. No recent antibiotic use, no recent hospital admissions.  On arrival the patient has a temperature of 102.9. When I picked up the patient at the start of my shift I noticed a elevated lactate and activated a code sepsis immediately. On exam the patient is alert and oriented. His abdomen is soft and non-tender to palpation. No CVA or flank tenderness. Lungs are clear to auscultation bilaterally. Good capillary refill.   Patient's lactic acid is elevated at 2.02. CMP reveals an elevated creatinine at 1.35. CBC reveals leukocytosis with a white count of 14,700. Urinalysis is nitrite positive with large leukocytes and many bacteria. Patient started on Rocephin and sepsis fluids. Will admit. Patient and family are in agreement with admission.   15:43 I consulted with Dr. Karleen Hampshire who accepted the patient for admission and requested step down temporary admission orders.   This patient was discussed with and evaluated by Dr. Johnney Killian who agrees with assessment and plan.   CRITICAL CARE Performed by: Hanley Hays   Total critical care time: 35 minutes  Critical care time was exclusive of separately billable procedures and treating other patients.  Critical care was necessary to treat or prevent imminent or life-threatening deterioration.  Critical care was time spent personally by me on the following activities: development of treatment plan with patient and/or surrogate as well as  nursing, discussions with consultants, evaluation of patient's response to treatment, examination of patient, obtaining history from patient or surrogate, ordering and performing treatments and interventions, ordering and review of laboratory studies, ordering and review of radiographic studies, pulse oximetry and re-evaluation of patient's condition.    Waynetta Pean, PA-C 01/29/16 1652  Charlesetta Shanks, MD 01/30/16 [phone]

## 2016-01-30 ENCOUNTER — Inpatient Hospital Stay (HOSPITAL_COMMUNITY): Payer: Medicare Other

## 2016-01-30 ENCOUNTER — Encounter (HOSPITAL_COMMUNITY): Payer: Self-pay | Admitting: General Practice

## 2016-01-30 DIAGNOSIS — R05 Cough: Secondary | ICD-10-CM | POA: Diagnosis present

## 2016-01-30 DIAGNOSIS — N390 Urinary tract infection, site not specified: Secondary | ICD-10-CM | POA: Diagnosis present

## 2016-01-30 DIAGNOSIS — R059 Cough, unspecified: Secondary | ICD-10-CM | POA: Diagnosis present

## 2016-01-30 LAB — BASIC METABOLIC PANEL
Anion gap: 6 (ref 5–15)
BUN: 31 mg/dL — AB (ref 6–20)
CHLORIDE: 109 mmol/L (ref 101–111)
CO2: 21 mmol/L — AB (ref 22–32)
CREATININE: 1.37 mg/dL — AB (ref 0.61–1.24)
Calcium: 7.9 mg/dL — ABNORMAL LOW (ref 8.9–10.3)
GFR calc Af Amer: 55 mL/min — ABNORMAL LOW (ref >60)
GFR calc non Af Amer: 48 mL/min — ABNORMAL LOW (ref >60)
Glucose, Bld: 117 mg/dL — ABNORMAL HIGH (ref 65–99)
Potassium: 4 mmol/L (ref 3.5–5.1)
Sodium: 136 mmol/L (ref 135–145)

## 2016-01-30 MED ORDER — ENOXAPARIN SODIUM 40 MG/0.4ML ~~LOC~~ SOLN
40.0000 mg | SUBCUTANEOUS | Status: DC
  Administered 2016-01-30: 40 mg via SUBCUTANEOUS
  Filled 2016-01-30: qty 0.4

## 2016-01-30 MED ORDER — PANTOPRAZOLE SODIUM 40 MG PO TBEC
40.0000 mg | DELAYED_RELEASE_TABLET | Freq: Two times a day (BID) | ORAL | Status: DC
  Administered 2016-01-31: 40 mg via ORAL
  Filled 2016-01-30: qty 1

## 2016-01-30 MED ORDER — SODIUM CHLORIDE 0.9 % IV BOLUS (SEPSIS)
1000.0000 mL | Freq: Once | INTRAVENOUS | Status: AC
  Administered 2016-01-30: 1000 mL via INTRAVENOUS

## 2016-01-30 NOTE — Progress Notes (Signed)
RT  NOTE:  Pt has brought in home CPAP. He stated he can manage it on his own. RT will monitor.

## 2016-01-30 NOTE — Progress Notes (Signed)
NURSING PROGRESS NOTE  [facility] [id number] Transfer Data: 01/30/2016 5:02 PM Attending Provider: Hosie Poisson, MD [id number] DENNIS, MD Code Status: Full   Scott [name] is a 80 y.o. male patient transferred from 2 C  -No acute distress noted.  -No complaints of shortness of breath.  -No complaints of chest pain.   Cardiac Monitoring: Box # 27 in place. Cardiac monitor yields:sinus bradycardia.  Last Documented Vital Signs: Blood pressure 107/53, pulse 55, temperature 98.7 F (37.1 C), temperature source Oral, resp. rate 21, height 5' 6.5" (1.689 m), weight 77.111 kg (170 lb), SpO2 97 %.  IV Fluids:  IV in place, occlusive dsg intact without redness, IV cath hand right, condition patent and no redness and forearm right, condition patent and no redness normal saline.   Allergies:  Shellfish allergy and Sulfa antibiotics  Past Medical History:   has a past medical history of Arthritis (2004); Myocardial infarction ([place]); Coronary artery disease (2007); Sleep apnea (2001); Hypertension; Hyperlipidemia; Acid reflux; Venous insufficiency; Enlarged prostate; Adenomatous colon polyp (01/2002); Shortness of breath dyspnea; and Prostate cancer ([place]).  Past Surgical History:   has past surgical history that includes Appendectomy (1989); Angioplasty (09/18/2005); Rotator cuff repair (2004); Tonsillectomy and adenoidectomy (1950); Vasectomy (1970); and Prostate biopsy (12/30/2000).  Social History:   reports that he has never smoked. He has never used smokeless tobacco. He reports that he does not drink alcohol or use illicit drugs.  Skin: intact except where otherwise charted  Patient/Family orientated to room. Information packet given to patient/family. Admission inpatient armband information verified with patient/family to include name and date of birth and placed on patient arm. Side rails up x 2, fall assessment and education completed with patient/family. Patient/family able to  verbalize understanding of risk associated with falls and verbalized understanding to call for assistance before getting out of bed. Call light within reach. Patient/family able to voice and demonstrate understanding of unit orientation instructions.

## 2016-01-30 NOTE — Progress Notes (Signed)
PROGRESS NOTE    Scott [name]  [id number] DOB: [birth year] DOA: 01/29/2016 PCP: Dwan Bolt, MD   Brief Narrative: Scott [name] is a 80 y.o. male with medical history significant of Hypertension, hyperlipidemia, GERD, was brougth in by his sister and wife to ED FOR Chills, rigors, fever and generalized weakness since this morning.  Assessment & Plan:   Active Problems:   Esophageal reflux   Sepsis ([place])   UTI (urinary tract infection)   Cough   Sepsis from urinary tract infection: Urine cultures growing e coli. Sensitivities are still pending.  Lactate normal after fluid resuscitation.    Leukocytosis: Recheck cbcb in am.  Probably from sepsis.    Cough worsening on laying down: CT does not show any pneumonia.  Probably from worsening GERD,  Increased protonix to BID.     DVT prophylaxis: (Lovenox) Code Status: (Full) Family Communication: (discussed with wife and sister at bedside.  Disposition Plan: pending PT eval.    Consultants:   None.    Procedures: CT chest without contrast.    Antimicrobials: rocephin 6/21   Subjective: Reports worsening cough onlaying down.   Objective: Filed Vitals:   01/30/16 0838 01/30/16 1325 01/30/16 1500 01/30/16 1600  BP: 89/43 96/46  107/53  Pulse: 52 54  55  Temp: 99.6 F (37.6 C) 98.6 F (37 C) 98.7 F (37.1 C)   TempSrc: Oral Oral Oral   Resp: 22 15  21   Height:      Weight:      SpO2: 93% 96%  97%    Intake/Output Summary (Last 24 hours) at 01/30/16 1853 Last data filed at 01/30/16 1200  Gross per 24 hour  Intake   1803 ml  Output    100 ml  Net   1703 ml   Filed Weights   01/29/16 1445  Weight: 77.111 kg (170 lb)    Examination:  General exam: Appears calm and comfortable  Respiratory system: Clear to auscultation. Respiratory effort normal. diminshed at bases.  Cardiovascular system: S1 & S2 heard, RRR. No JVD, murmurs, rubs, gallops or clicks. No pedal edema. Gastrointestinal  system: Abdomen is nondistended, soft and nontender. No organomegaly or masses felt. Normal bowel sounds heard. Central nervous system: Alert and oriented. No focal neurological deficits. Extremities: Symmetric 5 x 5 power. Skin: No rashes, lesions or ulcers Psychiatry: Judgement and insight appear normal. Mood & affect appropriate.     Data Reviewed: I have personally reviewed following labs and imaging studies  CBC:  Recent Labs Lab 01/29/16 1334  WBC 14.7*  NEUTROABS 13.2*  HGB 13.2  HCT 41.1  MCV 91.5  PLT 123XX123   Basic Metabolic Panel:  Recent Labs Lab 01/29/16 1334 01/30/16 1133  NA 137 136  K 4.2 4.0  CL 107 109  CO2 21* 21*  GLUCOSE 123* 117*  BUN 33* 31*  CREATININE 1.35* 1.37*  CALCIUM 9.4 7.9*   GFR: Estimated Creatinine Clearance: 40.9 mL/min (by C-G formula based on Cr of 1.37). Liver Function Tests:  Recent Labs Lab 01/29/16 1334  AST 22  ALT 21  ALKPHOS 67  BILITOT 1.3*  PROT 6.4*  ALBUMIN 3.8   No results for input(s): LIPASE, AMYLASE in the last 168 hours. No results for input(s): AMMONIA in the last 168 hours. Coagulation Profile: No results for input(s): INR, PROTIME in the last 168 hours. Cardiac Enzymes: No results for input(s): CKTOTAL, CKMB, CKMBINDEX, TROPONINI in the last 168 hours. BNP (last 3 results) No results for  input(s): PROBNP in the last 8760 hours. HbA1C: No results for input(s): HGBA1C in the last 72 hours. CBG: No results for input(s): GLUCAP in the last 168 hours. Lipid Profile: No results for input(s): CHOL, HDL, LDLCALC, TRIG, CHOLHDL, LDLDIRECT in the last 72 hours. Thyroid Function Tests: No results for input(s): TSH, T4TOTAL, FREET4, T3FREE, THYROIDAB in the last 72 hours. Anemia Panel: No results for input(s): VITAMINB12, FOLATE, FERRITIN, TIBC, IRON, RETICCTPCT in the last 72 hours. Sepsis Labs:  Recent Labs Lab 01/29/16 1356 01/29/16 1702  LATICACIDVEN 2.02* 0.89    Recent Results (from the past  240 hour(s))  Urine culture     Status: Abnormal (Preliminary result)   Collection Time: 01/29/16  2:36 PM  Result Value Ref Range Status   Specimen Description URINE, CLEAN CATCH  Final   Special Requests NONE  Final   Culture >=100,000 COLONIES/mL ESCHERICHIA COLI (A)  Final   Report Status PENDING  Incomplete  Blood Culture (routine x 2)     Status: None (Preliminary result)   Collection Time: 01/29/16  3:20 PM  Result Value Ref Range Status   Specimen Description BLOOD RIGHT HAND  Final   Special Requests BOTTLES DRAWN AEROBIC AND ANAEROBIC 5CC  Final   Culture NO GROWTH 1 DAY  Final   Report Status PENDING  Incomplete  Blood Culture (routine x 2)     Status: None (Preliminary result)   Collection Time: 01/29/16  3:25 PM  Result Value Ref Range Status   Specimen Description BLOOD LEFT ANTECUBITAL  Final   Special Requests BOTTLES DRAWN AEROBIC AND ANAEROBIC 5CC  Final   Culture NO GROWTH 1 DAY  Final   Report Status PENDING  Incomplete  MRSA PCR Screening     Status: None   Collection Time: 01/29/16  6:35 PM  Result Value Ref Range Status   MRSA by PCR NEGATIVE NEGATIVE Final    Comment:        The GeneXpert MRSA Assay (FDA approved for NASAL specimens only), is one component of a comprehensive MRSA colonization surveillance program. It is not intended to diagnose MRSA infection nor to guide or monitor treatment for MRSA infections.          Radiology Studies: Dg Chest 2 View  01/29/2016  CLINICAL DATA:  Intermittent cough for 2 months. Chills, fever and weakness today. EXAM: CHEST  2 VIEW COMPARISON:  PA and lateral chest 01/08/2016 and 05/03/2015. FINDINGS: There is cardiomegaly. Right aortic arch is noted. Lungs are clear. No pneumothorax or pleural effusion. IMPRESSION: No acute abnormality. Cardiomegaly and right aortic arch as seen on prior exams. Electronically Signed   By: Inge Rise M.D.   On: 01/29/2016 15:03   Ct Chest Wo Contrast  01/30/2016   CLINICAL DATA:  Cough and wheezing 1 month and intermittent chest pain. Shortness of breath 1 year. EXAM: CT CHEST WITHOUT CONTRAST TECHNIQUE: Multidetector CT imaging of the chest was performed following the standard protocol without IV contrast. COMPARISON:  CT abdomen/ pelvis 08/24/2012 FINDINGS: Cardiovascular: Heart is normal size. Small pericardial effusion is present. Calcified plaque over the 3 vessel coronary arteries. 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 Minimal calcified plaque over the thoracic aorta. Mediastinum: No significant mediastinal or hilar adenopathy. Remaining mediastinal structures are within normal. Lungs/Pleura: Lungs are adequately inflated with minimal linear scarring status atelectasis over the posterior left lower lobe and lingula. Small bilateral pleural effusions. Airways are within normal. Upper  Abdomen: No focal abnormality. Musculoskeletal: Mild degenerative change of the spine. IMPRESSION: Small bilateral pleural effusions and mild atelectasis/scarring over the lingula and left lower lobe. Small pericardial effusion. Three vessel atherosclerotic coronary artery disease. Aortic atherosclerosis. Right-sided aortic arch with aberrant left subclavian artery. Electronically Signed   By: Marin Olp M.D.   On: 01/30/2016 15:56        Scheduled Meds: . aspirin EC  81 mg Oral Daily  . cefTRIAXone (ROCEPHIN)  IV  1 g Intravenous Q24H  . clopidogrel  75 mg Oral QODAY  . enoxaparin (LOVENOX) injection  40 mg Subcutaneous Q24H  . finasteride  5 mg Oral Daily  . gabapentin  300 mg Oral BID  . montelukast  10 mg Oral q morning - 10a  . pantoprazole  40 mg Oral BID  . sodium chloride flush  3 mL Intravenous Q12H   Continuous Infusions: . sodium chloride 75 mL/hr at 01/30/16 1850     LOS: 1 day    Time spent: 25 minutes.     Hosie Poisson, MD [facility] Pager [phone]  If  7PM-7AM, please contact night-coverage [email or web address] Password [facility] 01/30/2016, 6:53 PM

## 2016-01-30 NOTE — Progress Notes (Signed)
   01/30/16 [id number]  Clinical Encounter Type  Visited With Patient  Visit Type Initial  Referral From Chaplain  Consult/Referral To Chaplain  Spiritual Encounters  Spiritual Needs Emotional  Stress Factors  Patient Stress Factors Exhausted;Health changes  Chaplain responded to call, patient verbalizes local church contact having been made, no additional service necessary at this time, blessing spoken over patient and staff. Advised that support services are available as requested 24/7.

## 2016-01-30 NOTE — Progress Notes (Signed)
Patient being transferred to 5W. Report called to receiving nurse. All questions answered. Patient's belongings transferred with patient. Patient's wife and sister notified of transfer and room number.

## 2016-01-31 DIAGNOSIS — K219 Gastro-esophageal reflux disease without esophagitis: Secondary | ICD-10-CM

## 2016-01-31 LAB — CBC
HCT: 35.5 % — ABNORMAL LOW (ref 39.0–52.0)
HEMOGLOBIN: 11.3 g/dL — AB (ref 13.0–17.0)
MCH: 29.4 pg (ref 26.0–34.0)
MCHC: 31.8 g/dL (ref 30.0–36.0)
MCV: 92.2 fL (ref 78.0–100.0)
Platelets: 127 10*3/uL — ABNORMAL LOW (ref 150–400)
RBC: 3.85 MIL/uL — AB (ref 4.22–5.81)
RDW: 14.3 % (ref 11.5–15.5)
WBC: 17.3 10*3/uL — ABNORMAL HIGH (ref 4.0–10.5)

## 2016-01-31 LAB — URINE MICROSCOPIC-ADD ON

## 2016-01-31 LAB — BASIC METABOLIC PANEL
Anion gap: 5 (ref 5–15)
BUN: 25 mg/dL — AB (ref 6–20)
CHLORIDE: 112 mmol/L — AB (ref 101–111)
CO2: 20 mmol/L — ABNORMAL LOW (ref 22–32)
CREATININE: 1.32 mg/dL — AB (ref 0.61–1.24)
Calcium: 7.6 mg/dL — ABNORMAL LOW (ref 8.9–10.3)
GFR calc Af Amer: 58 mL/min — ABNORMAL LOW (ref >60)
GFR calc non Af Amer: 50 mL/min — ABNORMAL LOW (ref >60)
GLUCOSE: 91 mg/dL (ref 65–99)
POTASSIUM: 3.9 mmol/L (ref 3.5–5.1)
Sodium: 137 mmol/L (ref 135–145)

## 2016-01-31 LAB — URINALYSIS, ROUTINE W REFLEX MICROSCOPIC
Bilirubin Urine: NEGATIVE
GLUCOSE, UA: NEGATIVE mg/dL
Hgb urine dipstick: NEGATIVE
Ketones, ur: NEGATIVE mg/dL
Nitrite: NEGATIVE
PROTEIN: NEGATIVE mg/dL
Specific Gravity, Urine: 1.024 (ref 1.005–1.030)
pH: 5.5 (ref 5.0–8.0)

## 2016-01-31 LAB — URINE CULTURE

## 2016-01-31 MED ORDER — AMOXICILLIN-POT CLAVULANATE 875-125 MG PO TABS: 1.0000 | ORAL_TABLET | Freq: Two times a day (BID) | ORAL | Status: DC

## 2016-01-31 NOTE — Evaluation (Signed)
Physical Therapy Evaluation Patient Details Name: Scott [name] MRN: [id number] DOB: June 04, 1936 Today's Date: 01/31/2016   History of Present Illness  Pt adm with sepsis due to UTI. PMH - HTN  Clinical Impression  Pt doing well with mobility and no further PT needed.  Ready for dc from PT standpoint.      Follow Up Recommendations No PT follow up    Equipment Recommendations  None recommended by PT    Recommendations for Other Services       Precautions / Restrictions Precautions Precautions: None      Mobility  Bed Mobility                  Transfers Overall transfer level: Independent                  Ambulation/Gait Ambulation/Gait assistance: Independent Ambulation Distance (Feet): 350 Feet Assistive device: None Gait Pattern/deviations: WFL(Within Functional Limits)   Gait velocity interpretation: at or above normal speed for age/gender General Gait Details: Steady gait  Stairs            Wheelchair Mobility    Modified Rankin (Stroke Patients Only)       Balance Overall balance assessment: Independent                                           Pertinent Vitals/Pain Pain Assessment: No/denies pain    Home Living Family/patient expects to be discharged to:: Private residence Living Arrangements: Spouse/significant other Available Help at Discharge: Family Type of Home: House Home Access: Stairs to enter Entrance Stairs-Rails: Right Entrance Stairs-Number of Steps: 3 Home Layout: One level Home Equipment: None      Prior Function Level of Independence: Independent               Hand Dominance        Extremity/Trunk Assessment   Upper Extremity Assessment: Overall WFL for tasks assessed           Lower Extremity Assessment: Overall WFL for tasks assessed         Communication   Communication: No difficulties  Cognition Arousal/Alertness: Awake/alert Behavior During Therapy: WFL  for tasks assessed/performed Overall Cognitive Status: Within Functional Limits for tasks assessed                      General Comments      Exercises        Assessment/Plan    PT Assessment Patent does not need any further PT services  PT Diagnosis Generalized weakness   PT Problem List    PT Treatment Interventions     PT Goals (Current goals can be found in the Care Plan section) Acute Rehab PT Goals PT Goal Formulation: All assessment and education complete, DC therapy    Frequency     Barriers to discharge        Co-evaluation               End of Session   Activity Tolerance: Patient tolerated treatment well Patient left: in chair;with call bell/phone within reach           Time: 0950-1001 PT Time Calculation (min) (ACUTE ONLY): 11 min   Charges:   PT Evaluation $PT Eval Low Complexity: 1 Procedure     PT G Codes:        Edman Lipsey  01/31/2016, 12:12 PM [place]

## 2016-01-31 NOTE — Care Management Important Message (Signed)
Important Message  Patient Details  Name: Scott [name] MRN: [id number] Date of Birth: [birth year]   Medicare Important Message Given:  Yes    Nathen May 01/31/2016, 11:33 AM

## 2016-01-31 NOTE — Progress Notes (Signed)
Pt given discharge instructions, prescriptions, and care notes. Pt verbalized understanding AEB no further questions or concerns at this time. IV was discontinued, no redness, pain, or swelling noted at this time. Telemetry discontinued and Centralized Telemetry was notified. Pt left the floor via wheelchair with staff in stable condition. 

## 2016-01-31 NOTE — Discharge Summary (Signed)
Physician Discharge Summary  Scott [name] [id number] DOB: June 12, 1936 DOA: 01/29/2016  PCP: Dwan Bolt, MD  Admit date: 01/29/2016 Discharge date: 01/31/2016  Admitted From: home Disposition:  Home   Recommendations for Outpatient Follow-up:  1. Follow up with PCP in 1-2 weeks 2. Please obtain BMP/CBC in one week 3. Please follow up on your blood cultures report.    Discharge Condition:stable. CODE STATUS:full code Diet recommendation: Heart Healthy   Brief/Interim Summary: Scott [name] is a 80 y.o. male with medical history significant of Hypertension, hyperlipidemia, GERD, was brougth in by his sister and wife to ED FOR Chills, rigors, fever and generalized weakness since the  Morning of admission.  Discharge Diagnoses:  Active Problems:   Esophageal reflux   Sepsis ([place])   UTI (urinary tract infection)   Cough  Sepsis from urinary tract infection: Urine cultures growing e coli. Sensitive to simple antibiotics.  His blood cultures have been negative so far.  Lactate normal after fluid resuscitation.    Leukocytosis: Probably from sepsis.  Recommend checking cbc in one week at PCP OFFICE.   Cough worsening on laying down: CT does not show any pneumonia.  Probably from worsening GERD,  Increased protonix to BID.  Outpatient follow up with gastroenterology if the cough doesn't improve.   Discharge Instructions  Discharge Instructions    Diet - low sodium heart healthy    Complete by:  As directed      Discharge instructions    Complete by:  As directed   Please follow up with Urology for urinary retention.  Please follow up with PCP in one week.            Medication List    TAKE these medications        alfuzosin 10 MG 24 hr tablet  Commonly known as:  UROXATRAL  Take 10 mg by mouth at bedtime.     amoxicillin-clavulanate 875-125 MG tablet  Commonly known as:  AUGMENTIN  Take 1 tablet by mouth 2 (two) times daily.     aspirin 81 MG  tablet  Take 81 mg by mouth daily.     atorvastatin 40 MG tablet  Commonly known as:  LIPITOR  Take 40 mg by mouth every evening.     benazepril 20 MG tablet  Commonly known as:  LOTENSIN  Take 20 mg by mouth daily.     calcium-vitamin D 500-200 MG-UNIT tablet  Commonly known as:  OSCAL WITH D  Take 1 tablet by mouth.     cetirizine 10 MG tablet  Commonly known as:  ZYRTEC  Take 10 mg by mouth daily.     clopidogrel 75 MG tablet  Commonly known as:  PLAVIX  Take 75 mg by mouth every other day.     eplerenone 25 MG tablet  Commonly known as:  INSPRA  Take 25 mg by mouth daily.     ferrous sulfate 325 (65 FE) MG EC tablet  Take 325 mg by mouth daily with breakfast.     finasteride 5 MG tablet  Commonly known as:  PROSCAR  Take 5 mg by mouth daily.     gabapentin 300 MG capsule  Commonly known as:  NEURONTIN  Take 300 mg by mouth 2 (two) times daily.     montelukast 10 MG tablet  Commonly known as:  SINGULAIR  Take 10 mg by mouth every morning.     multivitamin tablet  Take 1 tablet by mouth daily.  pantoprazole 20 MG tablet  Commonly known as:  PROTONIX  Take 40 mg by mouth daily.     sildenafil 100 MG tablet  Commonly known as:  VIAGRA  Take 100 mg by mouth daily as needed for erectile dysfunction.           Follow-up Information    Follow up with Dwan Bolt, MD. Schedule an appointment as soon as possible for a visit in 1 week.   Specialty:  Endocrinology   Contact information:   [address] [place] [address] [phone]       Follow up with WOODRUFF, Phoebe Sharps, MD. Schedule an appointment as soon as possible for a visit in 1 week.   Specialty:  Urology   Contact information:   [address] [place] [address] [phone]      Allergies  Allergen Reactions  . Shellfish Allergy Nausea And Vomiting  . Sulfa Antibiotics Other (See Comments)    Feels fatigued and weak when taking sulfa     Consultations:  none   Procedures/Studies: Dg Chest 2 View  01/29/2016  CLINICAL DATA:  Intermittent cough for 2 months. Chills, fever and weakness today. EXAM: CHEST  2 VIEW COMPARISON:  PA and lateral chest 01/08/2016 and 05/03/2015. FINDINGS: There is cardiomegaly. Right aortic arch is noted. Lungs are clear. No pneumothorax or pleural effusion. IMPRESSION: No acute abnormality. Cardiomegaly and right aortic arch as seen on prior exams. Electronically Signed   By: Inge Rise M.D.   On: 01/29/2016 15:03   Ct Chest Wo Contrast  01/30/2016  CLINICAL DATA:  Cough and wheezing 1 month and intermittent chest pain. Shortness of breath 1 year. EXAM: CT CHEST WITHOUT CONTRAST TECHNIQUE: Multidetector CT imaging of the chest was performed following the standard protocol without IV contrast. COMPARISON:  CT abdomen/ pelvis 08/24/2012 FINDINGS: Cardiovascular: Heart is normal size. Small pericardial effusion is present. Calcified plaque over the 3 vessel coronary arteries. 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 Minimal calcified plaque over the thoracic aorta. Mediastinum: No significant mediastinal or hilar adenopathy. Remaining mediastinal structures are within normal. Lungs/Pleura: Lungs are adequately inflated with minimal linear scarring status atelectasis over the posterior left lower lobe and lingula. Small bilateral pleural effusions. Airways are within normal. Upper Abdomen: No focal abnormality. Musculoskeletal: Mild degenerative change of the spine. IMPRESSION: Small bilateral pleural effusions and mild atelectasis/scarring over the lingula and left lower lobe. Small pericardial effusion. Three vessel atherosclerotic coronary artery disease. Aortic atherosclerosis. Right-sided aortic arch with aberrant left subclavian artery. Electronically Signed   By: Marin Olp M.D.   On: 01/30/2016 15:56        Subjective: No new complaints, wants to go home. No ches tpain or sob.   Discharge Exam: Filed Vitals:   01/31/16 0514 01/31/16 1329  BP: 111/52 142/63  Pulse: 48 51  Temp: 98.6 F (37 C) 98.2 F (36.8 C)  Resp: 16 18   Filed Vitals:   01/30/16 1600 01/30/16 2136 01/31/16 0514 01/31/16 1329  BP: 107/53 102/59 111/52 142/63  Pulse: 55 60 48 51  Temp:  98 F (36.7 C) 98.6 F (37 C) 98.2 F (36.8 C)  TempSrc:  Oral Oral   Resp: 21 18 16 18   Height:      Weight:      SpO2: 97% 100% 93% 100%    General: Pt is alert, awake, not in acute distress Cardiovascular: RRR, S1/S2 +,  no rubs, no gallops Respiratory: CTA bilaterally, no wheezing, no rhonchi Abdominal: Soft, NT, ND, bowel sounds + Extremities: no edema, no cyanosis    The results of significant diagnostics from this hospitalization (including imaging, microbiology, ancillary and laboratory) are listed below for reference.     Microbiology: Recent Results (from the past 240 hour(s))  Urine culture     Status: Abnormal   Collection Time: 01/29/16  2:36 PM  Result Value Ref Range Status   Specimen Description URINE, CLEAN CATCH  Final   Special Requests NONE  Final   Culture >=100,000 COLONIES/mL ESCHERICHIA COLI (A)  Final   Report Status 01/31/2016 FINAL  Final   Organism ID, Bacteria ESCHERICHIA COLI (A)  Final      Susceptibility   Escherichia coli - MIC*    AMPICILLIN <=2 SENSITIVE Sensitive     CEFAZOLIN <=4 SENSITIVE Sensitive     CEFTRIAXONE <=1 SENSITIVE Sensitive     CIPROFLOXACIN <=0.25 SENSITIVE Sensitive     GENTAMICIN <=1 SENSITIVE Sensitive     IMIPENEM <=0.25 SENSITIVE Sensitive     NITROFURANTOIN <=16 SENSITIVE Sensitive     TRIMETH/SULFA <=20 SENSITIVE Sensitive     AMPICILLIN/SULBACTAM <=2 SENSITIVE Sensitive     PIP/TAZO <=4 SENSITIVE Sensitive     * >=100,000 COLONIES/mL ESCHERICHIA COLI  Blood Culture (routine x 2)     Status: None (Preliminary result)   Collection Time:  01/29/16  3:20 PM  Result Value Ref Range Status   Specimen Description BLOOD RIGHT HAND  Final   Special Requests BOTTLES DRAWN AEROBIC AND ANAEROBIC 5CC  Final   Culture NO GROWTH 2 DAYS  Final   Report Status PENDING  Incomplete  Blood Culture (routine x 2)     Status: None (Preliminary result)   Collection Time: 01/29/16  3:25 PM  Result Value Ref Range Status   Specimen Description BLOOD LEFT ANTECUBITAL  Final   Special Requests BOTTLES DRAWN AEROBIC AND ANAEROBIC 5CC  Final   Culture NO GROWTH 2 DAYS  Final   Report Status PENDING  Incomplete  MRSA PCR Screening     Status: None   Collection Time: 01/29/16  6:35 PM  Result Value Ref Range Status   MRSA by PCR NEGATIVE NEGATIVE Final    Comment:        The GeneXpert MRSA Assay (FDA approved for NASAL specimens only), is one component of a comprehensive MRSA colonization surveillance program. It is not intended to diagnose MRSA infection nor to guide or monitor treatment for MRSA infections.      Labs: BNP (last 3 results)  Recent Labs  01/29/16 1334  BNP [id number]   Basic Metabolic Panel:  Recent Labs Lab 01/29/16 1334 01/30/16 1133 01/31/16 0558  NA 137 136 137  K 4.2 4.0 3.9  CL 107 109 112*  CO2 21* 21* 20*  GLUCOSE 123* 117* 91  BUN 33* 31* 25*  CREATININE 1.35* 1.37* 1.32*  CALCIUM 9.4 7.9* 7.6*   Liver Function Tests:  Recent Labs Lab 01/29/16 1334  AST 22  ALT 21  ALKPHOS 67  BILITOT 1.3*  PROT 6.4*  ALBUMIN 3.8   No results for input(s): LIPASE, AMYLASE in the last 168 hours. No results for input(s): AMMONIA in the last 168 hours. CBC:  Recent Labs Lab 01/29/16 1334 01/31/16 0558  WBC 14.7* 17.3*  NEUTROABS 13.2*  --   HGB 13.2 11.3*  HCT 41.1 35.5*  MCV 91.5 92.2  PLT 151 127*   Cardiac Enzymes:  No results for input(s): CKTOTAL, CKMB, CKMBINDEX, TROPONINI in the last 168 hours. BNP: Invalid input(s): POCBNP CBG: No results for input(s): GLUCAP in the last 168  hours. D-Dimer No results for input(s): DDIMER in the last 72 hours. Hgb A1c No results for input(s): HGBA1C in the last 72 hours. Lipid Profile No results for input(s): CHOL, HDL, LDLCALC, TRIG, CHOLHDL, LDLDIRECT in the last 72 hours. Thyroid function studies No results for input(s): TSH, T4TOTAL, T3FREE, THYROIDAB in the last 72 hours.  Invalid input(s): FREET3 Anemia work up No results for input(s): VITAMINB12, FOLATE, FERRITIN, TIBC, IRON, RETICCTPCT in the last 72 hours. Urinalysis    Component Value Date/Time   COLORURINE YELLOW 01/31/2016 Scissors 01/31/2016 1345   LABSPEC 1.024 01/31/2016 1345   PHURINE 5.5 01/31/2016 1345   GLUCOSEU NEGATIVE 01/31/2016 1345   HGBUR NEGATIVE 01/31/2016 1345   BILIRUBINUR NEGATIVE 01/31/2016 1345   KETONESUR NEGATIVE 01/31/2016 1345   PROTEINUR NEGATIVE 01/31/2016 1345   NITRITE NEGATIVE 01/31/2016 1345   LEUKOCYTESUR SMALL* 01/31/2016 1345   Sepsis Labs Invalid input(s): PROCALCITONIN,  WBC,  LACTICIDVEN Microbiology Recent Results (from the past 240 hour(s))  Urine culture     Status: Abnormal   Collection Time: 01/29/16  2:36 PM  Result Value Ref Range Status   Specimen Description URINE, CLEAN CATCH  Final   Special Requests NONE  Final   Culture >=100,000 COLONIES/mL ESCHERICHIA COLI (A)  Final   Report Status 01/31/2016 FINAL  Final   Organism ID, Bacteria ESCHERICHIA COLI (A)  Final      Susceptibility   Escherichia coli - MIC*    AMPICILLIN <=2 SENSITIVE Sensitive     CEFAZOLIN <=4 SENSITIVE Sensitive     CEFTRIAXONE <=1 SENSITIVE Sensitive     CIPROFLOXACIN <=0.25 SENSITIVE Sensitive     GENTAMICIN <=1 SENSITIVE Sensitive     IMIPENEM <=0.25 SENSITIVE Sensitive     NITROFURANTOIN <=16 SENSITIVE Sensitive     TRIMETH/SULFA <=20 SENSITIVE Sensitive     AMPICILLIN/SULBACTAM <=2 SENSITIVE Sensitive     PIP/TAZO <=4 SENSITIVE Sensitive     * >=100,000 COLONIES/mL ESCHERICHIA COLI  Blood Culture  (routine x 2)     Status: None (Preliminary result)   Collection Time: 01/29/16  3:20 PM  Result Value Ref Range Status   Specimen Description BLOOD RIGHT HAND  Final   Special Requests BOTTLES DRAWN AEROBIC AND ANAEROBIC 5CC  Final   Culture NO GROWTH 2 DAYS  Final   Report Status PENDING  Incomplete  Blood Culture (routine x 2)     Status: None (Preliminary result)   Collection Time: 01/29/16  3:25 PM  Result Value Ref Range Status   Specimen Description BLOOD LEFT ANTECUBITAL  Final   Special Requests BOTTLES DRAWN AEROBIC AND ANAEROBIC 5CC  Final   Culture NO GROWTH 2 DAYS  Final   Report Status PENDING  Incomplete  MRSA PCR Screening     Status: None   Collection Time: 01/29/16  6:35 PM  Result Value Ref Range Status   MRSA by PCR NEGATIVE NEGATIVE Final    Comment:        The GeneXpert MRSA Assay (FDA approved for NASAL specimens only), is one component of a comprehensive MRSA colonization surveillance program. It is not intended to diagnose MRSA infection nor to guide or monitor treatment for MRSA infections.      Time coordinating discharge: Over 30 minutes  SIGNED:   Hosie Poisson, MD  [facility] 01/31/2016, 2:27 PM  Pager [phone]  If 7PM-7AM, please contact night-coverage [email or web address] Password [email or web address]

## 2016-01-31 NOTE — Care Management Note (Signed)
Case Management Note  Patient Details  Name: Scott [name] MRN: [id number] Date of Birth: Jun 08, 1936  Subjective/Objective:                 Patient admitted from home for sepsis.    Action/Plan:   Expected Discharge Date:                  Expected Discharge Plan:  Home/Self Care  In-House Referral:     Discharge planning Services  CM Consult  Post Acute Care Choice:  NA Choice offered to:     DME Arranged:    DME Agency:     HH Arranged:    HH Agency:     Status of Service:  Completed, signed off  If discussed at [employer] of Stay Meetings, dates discussed:    Additional Comments:  Carles Collet, RN 01/31/2016, 2:35 PM

## 2016-01-31 NOTE — Clinical Documentation Improvement (Signed)
Internal Medicine  Abnormal Lab/Test Results:  BUN 6 - 20 mg/dL 25 (H) 31 (H) 33 (H       Creatinine, Ser 0.61 - 1.24 mg/dL 1.32 (H) 1.37 (H) 1.35        GFR   50 L/ 55/ 56   Possible Clinical Conditions associated with below indicators:     Chronic Kidney Disease stage  (I-IV)  Acute Kidney Injury  Other Condition  Cannot Clinically Determine   Supporting Information: Hypertension, CAD    Treatment Provided:  NS @ 75 ml/hr    Evaluation: Creatinine lab     Please exercise your independent, professional judgment when responding. A specific answer is not anticipated or expected.   Thank You,  [place] [phone]

## 2016-02-03 LAB — CULTURE, BLOOD (ROUTINE X 2)
CULTURE: NO GROWTH
Culture: NO GROWTH

## 2016-06-23 NOTE — H&P (Signed)
Scott [name] is an 80 y.o. male.    Chief Complaint: left knee pain  HPI: Pt is a 80 y.o. male complaining of left knee pain for multiple years. Pain had continually increased since the beginning. X-rays in the clinic show end-stage arthritic changes of the left knee. Pt has tried various conservative treatments which have failed to alleviate their symptoms, including injections and therapy. Various options are discussed with the patient. Risks, benefits and expectations were discussed with the patient. Patient understand the risks, benefits and expectations and wishes to proceed with surgery.   PCP:  Dwan Bolt, MD  D/C Plans: Home  PMH: Past Medical History:  Diagnosis Date  . Acid reflux   . Adenomatous colon polyp 01/2002  . Arthritis 2004  . Coronary artery disease 2007   cardiac cath w/ angioplasty & stent placement x 1  . Enlarged prostate   . Hyperlipidemia   . Hypertension   . Myocardial infarction   . Prostate cancer ([place])   . Shortness of breath dyspnea   . Sleep apnea 2001  . Venous insufficiency     PSH: Past Surgical History:  Procedure Laterality Date  . ANGIOPLASTY  09/18/2005   1 stent placed  . APPENDECTOMY  1989  . PROSTATE BIOPSY  12/30/2000  . ROTATOR CUFF REPAIR  2004   right  . TONSILLECTOMY AND ADENOIDECTOMY  1950  . VASECTOMY  1970    Social History:  reports that he has never smoked. He has never used smokeless tobacco. He reports that he does not drink alcohol or use drugs.  Allergies:  Allergies  Allergen Reactions  . Shellfish Allergy Nausea And Vomiting  . Sulfa Antibiotics Other (See Comments)    Feels fatigued and weak when taking sulfa    Medications: No current facility-administered medications for this encounter.    Current Outpatient Prescriptions  Medication Sig Dispense Refill  . alfuzosin (UROXATRAL) 10 MG 24 hr tablet Take 10 mg by mouth at bedtime.    Marland Kitchen amoxicillin-clavulanate (AUGMENTIN) 875-125 MG tablet Take 1  tablet by mouth 2 (two) times daily. 14 tablet 0  . aspirin 81 MG tablet Take 81 mg by mouth daily.     Marland Kitchen atorvastatin (LIPITOR) 40 MG tablet Take 40 mg by mouth every evening.    . benazepril (LOTENSIN) 20 MG tablet Take 20 mg by mouth daily.    . calcium-vitamin D (OSCAL WITH D) 500-200 MG-UNIT per tablet Take 1 tablet by mouth.    . cetirizine (ZYRTEC) 10 MG tablet Take 10 mg by mouth daily.    . clopidogrel (PLAVIX) 75 MG tablet Take 75 mg by mouth every other day.     Marland Kitchen eplerenone (INSPRA) 25 MG tablet Take 25 mg by mouth daily.    . ferrous sulfate 325 (65 FE) MG EC tablet Take 325 mg by mouth daily with breakfast.    . finasteride (PROSCAR) 5 MG tablet Take 5 mg by mouth daily.    Marland Kitchen gabapentin (NEURONTIN) 300 MG capsule Take 300 mg by mouth 2 (two) times daily.    . montelukast (SINGULAIR) 10 MG tablet Take 10 mg by mouth every morning.    . Multiple Vitamin (MULTIVITAMIN) tablet Take 1 tablet by mouth daily.    . pantoprazole (PROTONIX) 20 MG tablet Take 40 mg by mouth daily.     . sildenafil (VIAGRA) 100 MG tablet Take 100 mg by mouth daily as needed for erectile dysfunction.      No results found  for this or any previous visit (from the past 48 hour(s)). No results found.  ROS: Pain with rom of the left lower extremity  Physical Exam:  Alert and oriented 80 y.o. male in no acute distress Cranial nerves 2-12 intact Cervical spine: full rom with no tenderness, nv intact distally Chest: active breath sounds bilaterally, no wheeze rhonchi or rales Heart: regular rate and rhythm, no murmur Abd: non tender non distended with active bowel sounds Hip is stable with rom  Left knee with moderate medial and lateral joint line tenderness nv intact distally Antalgic gait  Assessment/Plan Assessment: left knee end stage osteoarthritis  Plan: Patient will undergo a left total knee arthroplasty by Dr. Veverly Fells at [facility]. Risks benefits and expectations were discussed with the  patient. Patient understand risks, benefits and expectations and wishes to proceed.

## 2016-06-30 ENCOUNTER — Encounter (HOSPITAL_COMMUNITY)
Admission: RE | Admit: 2016-06-30 | Discharge: 2016-06-30 | Disposition: A | Discharge status: Home / Self Care | Payer: Medicare Other | Source: Ambulatory Visit | Attending: Orthopedic Surgery | Admitting: Orthopedic Surgery

## 2016-06-30 ENCOUNTER — Encounter (HOSPITAL_COMMUNITY): Payer: Self-pay

## 2016-06-30 DIAGNOSIS — M1712 Unilateral primary osteoarthritis, left knee: Secondary | ICD-10-CM | POA: Diagnosis not present

## 2016-06-30 DIAGNOSIS — Z01818 Encounter for other preprocedural examination: Secondary | ICD-10-CM | POA: Insufficient documentation

## 2016-06-30 HISTORY — DX: Myoneural disorder, unspecified: G70.9

## 2016-06-30 HISTORY — DX: Urinary tract infection, site not specified: N39.0

## 2016-06-30 HISTORY — DX: Sepsis, unspecified organism: A41.9

## 2016-06-30 LAB — BASIC METABOLIC PANEL
Anion gap: 7 (ref 5–15)
BUN: 24 mg/dL — ABNORMAL HIGH (ref 6–20)
CALCIUM: 9.3 mg/dL (ref 8.9–10.3)
CO2: 23 mmol/L (ref 22–32)
CREATININE: 1.11 mg/dL (ref 0.61–1.24)
Chloride: 108 mmol/L (ref 101–111)
Glucose, Bld: 145 mg/dL — ABNORMAL HIGH (ref 65–99)
Potassium: 4.3 mmol/L (ref 3.5–5.1)
SODIUM: 138 mmol/L (ref 135–145)

## 2016-06-30 LAB — CBC
HCT: 42.6 % (ref 39.0–52.0)
Hemoglobin: 13.9 g/dL (ref 13.0–17.0)
MCH: 30.3 pg (ref 26.0–34.0)
MCHC: 32.6 g/dL (ref 30.0–36.0)
MCV: 92.8 fL (ref 78.0–100.0)
PLATELETS: 208 10*3/uL (ref 150–400)
RBC: 4.59 MIL/uL (ref 4.22–5.81)
RDW: 13.6 % (ref 11.5–15.5)
WBC: 8.8 10*3/uL (ref 4.0–10.5)

## 2016-06-30 LAB — SURGICAL PCR SCREEN
MRSA, PCR: NEGATIVE
STAPHYLOCOCCUS AUREUS: NEGATIVE

## 2016-06-30 NOTE — Pre-Procedure Instructions (Signed)
Scott [name]  06/30/2016      [facility] - [place], [place] [address] [place] [address] Phone: [phone] Fax: [phone]  [name] [place], [name] [address] [place] [address] Phone: [phone] Fax: [phone]  [id number] [name], [place] [address] Phone: [phone] Fax: [phone]    Your procedure is scheduled on 07/10/2016  Report to [facility] Admitting at 5:30 A.M.  Call this number if you have problems the morning of surgery:  [phone]   Remember:  Do not eat food or drink liquids after midnight.  On Thursday 11/30    Take these medicines the morning of surgery with A SIP OF WATER :Gabapentin, Zyrtec, Singulair, Protonix    Do not wear jewelry   Do not wear lotions, powders, or perfumes, or deoderant.    Men may shave face and neck.   Do not bring valuables to the [name].   [facility] is not responsible for any belongings or valuables.  Contacts, dentures or bridgework may not be worn into surgery.  Leave your suitcase in the car.  After surgery it may be brought to your room.  For patients admitted to the [name], discharge time will be determined by your treatment team.  Patients discharged the day of surgery will not be allowed to drive home.   Name and phone number of your driver:   Wife  Special instructions:  Special Instructions: [name] - Preparing for Surgery  Before surgery, you can play an important role.  Because skin is not sterile, your skin needs to be as free of germs as possible.  You can reduce the number of germs on you skin by washing with CHG (chlorahexidine gluconate) soap before surgery.  CHG is an antiseptic cleaner which kills germs and bonds with the skin to continue killing germs  even after washing.  Please DO NOT use if you have an allergy to CHG or antibacterial soaps.  If your skin becomes reddened/irritated stop using the CHG and inform your nurse when you arrive at Short Stay.  Do not shave (including legs and underarms) for at least 48 hours prior to the first CHG shower.  You may shave your face.  Please follow these instructions carefully:   1.  Shower with CHG Soap the night before surgery and the  morning of Surgery.  2.  If you choose to wash your hair, wash your hair first as usual with your  normal shampoo.  3.  After you shampoo, rinse your hair and body thoroughly to remove the  Shampoo.  4.  Use CHG as you would any other liquid soap.  You can apply chg directly to the skin and wash gently with scrungie or a clean washcloth.  5.  Apply the CHG Soap to your body ONLY FROM THE NECK DOWN.    Do not use on open wounds or open sores.  Avoid contact with your eyes, ears, mouth and genitals (private parts).  Wash genitals (private parts)   with your normal soap.  6.  Wash thoroughly, paying special attention to the area where your surgery will be performed.  7.  Thoroughly rinse your body with warm water from the neck down.  8.  DO NOT shower/wash with your  normal soap after using and rinsing off   the CHG Soap.  9.  Pat yourself dry with a clean towel.            10.  Wear clean pajamas.            11.  Place clean sheets on your bed the night of your first shower and do not sleep with pets.  Day of Surgery  Do not apply any lotions/deodorants the morning of surgery.  Please wear clean clothes to the [name]/surgery center.  Please read over the following fact sheets that you were given. Pain Booklet, Coughing and Deep Breathing, Total Joint Packet, MRSA Information and Surgical Site Infection Prevention

## 2016-06-30 NOTE — Progress Notes (Signed)
Pt. Followed by Dr.  Wilson Singer for PCP & Dr. Einar Gip for heartcare. Pt. Has just seen both doctors for prep. Of surgery.  Pt. Reports that he had a cardiac cath. in [place] while spending the winter there in 2007, experienced a heart attack & x1 stent was placed. Last stress test 2013. Requested via fax the records from Dr. Einar Gip.  Pt. Reports the sepsis that he had earlier this yr. Was related to a UTI.

## 2016-06-30 NOTE — Progress Notes (Signed)
Call to Dr. Gilberto Better office, spoke with Margarita Grizzle & she will ask the clinic to call the pt. & tell him when to hold his plavix

## 2016-07-01 NOTE — Progress Notes (Signed)
Anesthesia Chart Review:  Pt is a 80 year old male scheduled for L total knee arthroplasty on 07/10/2016 with Netta Cedars, MD.   - Cardiologist is Kela Millin, MD, who cleared pt for surgery at last office visit 05/20/16. - PCP is Anda Kraft, MD.   PMH includes:  CAD (DES to RCA 2007 in [place]), HTN, OSA, venous insufficiency, prostate cancer. Never smoker. BMI 29  Medications include: ASA, lipitor, plavix, eplerenone, iron, lasix, protonix, sildenafil  Preoperative labs reviewed.    CXR 01/29/16: No acute abnormality. Cardiomegaly and right aortic arch as seen on prior exams.  CT chest 01/30/16: Small bilateral pleural effusions and mild atelectasis/scarring over the lingula and left lower lobe. Small pericardial effusion. Three vessel atherosclerotic coronary artery disease. Aortic  Atherosclerosis. Right-sided aortic arch with aberrant left subclavian artery.  EKG 05/20/16: NSR. Old inferior infarct. Old anterior infarct  Echo 05/09/15 [facility] - by notes): 1. LV cavity is normal in size. Normal global wall motion. EF 65-65%. Grade I diastolic dysfunction. 2. LA cavity mild to moderately dilated. 3. Mild mitral regurgitation. 4. Trace tricuspid regurgitation. 5. Small pericardial effusion with clear fluids.  Nuclear stress test 05/06/15 [facility] - by notes): Stress EKG is negative for ischemia. 7.05 METS. Myocardial perfusion imaging is normal. LV systolic function normal, without regional wall motion abnormalities. LVEF 50%.  If no changes, I anticipate pt can proceed with surgery as scheduled.   Willeen Cass, FNP-BC [facility]/Anesthesiology Phone: [phone] 07/01/2016 4:34 PM

## 2016-07-09 MED ORDER — TRANEXAMIC ACID 1000 MG/10ML IV SOLN
1000.0000 mg | INTRAVENOUS | Status: AC
  Administered 2016-07-10: 1000 mg via INTRAVENOUS
  Filled 2016-07-09: qty 10

## 2016-07-10 ENCOUNTER — Inpatient Hospital Stay (HOSPITAL_COMMUNITY): Payer: Medicare Other

## 2016-07-10 ENCOUNTER — Encounter (HOSPITAL_COMMUNITY)
Admission: RE | Disposition: A | Discharge status: Skilled Nursing Facility | Payer: Self-pay | Source: Ambulatory Visit | Attending: Orthopedic Surgery

## 2016-07-10 ENCOUNTER — Inpatient Hospital Stay (HOSPITAL_COMMUNITY)
Admission: RE | Admit: 2016-07-10 | Discharge: 2016-07-13 | DRG: 470 | Disposition: A | Discharge status: Skilled Nursing Facility | Payer: Medicare Other | Source: Ambulatory Visit | Attending: Orthopedic Surgery | Admitting: Orthopedic Surgery

## 2016-07-10 ENCOUNTER — Encounter (HOSPITAL_COMMUNITY): Payer: Self-pay | Admitting: *Deleted

## 2016-07-10 ENCOUNTER — Inpatient Hospital Stay (HOSPITAL_COMMUNITY): Payer: Medicare Other | Admitting: Anesthesiology

## 2016-07-10 ENCOUNTER — Inpatient Hospital Stay (HOSPITAL_COMMUNITY): Payer: Medicare Other | Admitting: Emergency Medicine

## 2016-07-10 DIAGNOSIS — Z79899 Other long term (current) drug therapy: Secondary | ICD-10-CM

## 2016-07-10 DIAGNOSIS — Z8546 Personal history of malignant neoplasm of prostate: Secondary | ICD-10-CM

## 2016-07-10 DIAGNOSIS — Z7902 Long term (current) use of antithrombotics/antiplatelets: Secondary | ICD-10-CM | POA: Diagnosis not present

## 2016-07-10 DIAGNOSIS — Z955 Presence of coronary angioplasty implant and graft: Secondary | ICD-10-CM

## 2016-07-10 DIAGNOSIS — M25562 Pain in left knee: Secondary | ICD-10-CM | POA: Diagnosis present

## 2016-07-10 DIAGNOSIS — I10 Essential (primary) hypertension: Secondary | ICD-10-CM | POA: Diagnosis present

## 2016-07-10 DIAGNOSIS — M1712 Unilateral primary osteoarthritis, left knee: Principal | ICD-10-CM | POA: Diagnosis present

## 2016-07-10 DIAGNOSIS — E785 Hyperlipidemia, unspecified: Secondary | ICD-10-CM | POA: Diagnosis present

## 2016-07-10 DIAGNOSIS — M25662 Stiffness of left knee, not elsewhere classified: Secondary | ICD-10-CM

## 2016-07-10 DIAGNOSIS — J9811 Atelectasis: Secondary | ICD-10-CM | POA: Diagnosis not present

## 2016-07-10 DIAGNOSIS — I2510 Atherosclerotic heart disease of native coronary artery without angina pectoris: Secondary | ICD-10-CM | POA: Diagnosis present

## 2016-07-10 DIAGNOSIS — Z91013 Allergy to seafood: Secondary | ICD-10-CM

## 2016-07-10 DIAGNOSIS — Z96652 Presence of left artificial knee joint: Secondary | ICD-10-CM

## 2016-07-10 DIAGNOSIS — R05 Cough: Secondary | ICD-10-CM

## 2016-07-10 DIAGNOSIS — R059 Cough, unspecified: Secondary | ICD-10-CM

## 2016-07-10 DIAGNOSIS — I252 Old myocardial infarction: Secondary | ICD-10-CM

## 2016-07-10 DIAGNOSIS — K219 Gastro-esophageal reflux disease without esophagitis: Secondary | ICD-10-CM | POA: Diagnosis present

## 2016-07-10 DIAGNOSIS — Z7982 Long term (current) use of aspirin: Secondary | ICD-10-CM

## 2016-07-10 DIAGNOSIS — Z881 Allergy status to other antibiotic agents status: Secondary | ICD-10-CM

## 2016-07-10 DIAGNOSIS — R262 Difficulty in walking, not elsewhere classified: Secondary | ICD-10-CM

## 2016-07-10 HISTORY — PX: TOTAL KNEE ARTHROPLASTY: SHX125

## 2016-07-10 LAB — CBC
HEMATOCRIT: 41.2 % (ref 39.0–52.0)
HEMOGLOBIN: 13.5 g/dL (ref 13.0–17.0)
MCH: 30.3 pg (ref 26.0–34.0)
MCHC: 32.8 g/dL (ref 30.0–36.0)
MCV: 92.6 fL (ref 78.0–100.0)
Platelets: 191 10*3/uL (ref 150–400)
RBC: 4.45 MIL/uL (ref 4.22–5.81)
RDW: 13.8 % (ref 11.5–15.5)
WBC: 14 10*3/uL — ABNORMAL HIGH (ref 4.0–10.5)

## 2016-07-10 LAB — CREATININE, SERUM
CREATININE: 1.18 mg/dL (ref 0.61–1.24)
GFR calc Af Amer: >60 mL/min (ref >60)
GFR calc non Af Amer: 57 mL/min — ABNORMAL LOW (ref >60)

## 2016-07-10 SURGERY — ARTHROPLASTY, KNEE, TOTAL
Anesthesia: Spinal | Site: Knee | Laterality: Left

## 2016-07-10 MED ORDER — CHLORHEXIDINE GLUCONATE 4 % EX LIQD: 60.0000 mL | Freq: Once | CUTANEOUS | Status: DC

## 2016-07-10 MED ORDER — METHOCARBAMOL 1000 MG/10ML IJ SOLN
500.0000 mg | Freq: Four times a day (QID) | INTRAVENOUS | Status: DC | PRN
  Filled 2016-07-10: qty 5

## 2016-07-10 MED ORDER — ONDANSETRON HCL 4 MG/2ML IJ SOLN
4.0000 mg | Freq: Four times a day (QID) | INTRAMUSCULAR | Status: DC | PRN
  Administered 2016-07-10: 4 mg via INTRAVENOUS
  Filled 2016-07-10: qty 2

## 2016-07-10 MED ORDER — SODIUM CHLORIDE 0.9 % IR SOLN
Status: DC | PRN
  Administered 2016-07-10: 3000 mL

## 2016-07-10 MED ORDER — METOCLOPRAMIDE HCL 5 MG/ML IJ SOLN: 5.0000 mg | Freq: Three times a day (TID) | INTRAMUSCULAR | Status: DC | PRN

## 2016-07-10 MED ORDER — PHENYLEPHRINE HCL 10 MG/ML IJ SOLN
INTRAVENOUS | Status: DC | PRN
  Administered 2016-07-10: 50 ug/min via INTRAVENOUS

## 2016-07-10 MED ORDER — MONTELUKAST SODIUM 10 MG PO TABS
10.0000 mg | ORAL_TABLET | Freq: Every morning | ORAL | Status: DC
  Administered 2016-07-11 – 2016-07-13 (×3): 10 mg via ORAL
  Filled 2016-07-10 (×3): qty 1

## 2016-07-10 MED ORDER — ATORVASTATIN CALCIUM 40 MG PO TABS
40.0000 mg | ORAL_TABLET | Freq: Every evening | ORAL | Status: DC
  Administered 2016-07-10 – 2016-07-12 (×2): 40 mg via ORAL
  Filled 2016-07-10 (×2): qty 1

## 2016-07-10 MED ORDER — METOCLOPRAMIDE HCL 5 MG/ML IJ SOLN: 10.0000 mg | Freq: Once | INTRAMUSCULAR | Status: DC | PRN

## 2016-07-10 MED ORDER — METOCLOPRAMIDE HCL 5 MG PO TABS: 5.0000 mg | ORAL_TABLET | Freq: Three times a day (TID) | ORAL | Status: DC | PRN

## 2016-07-10 MED ORDER — OXYCODONE HCL 5 MG PO TABS
5.0000 mg | ORAL_TABLET | ORAL | Status: DC | PRN
  Administered 2016-07-10 (×2): 5 mg via ORAL
  Administered 2016-07-11: 10 mg via ORAL
  Administered 2016-07-11: 5 mg via ORAL
  Administered 2016-07-11 – 2016-07-13 (×5): 10 mg via ORAL
  Administered 2016-07-13: 5 mg via ORAL
  Administered 2016-07-13: 10 mg via ORAL
  Filled 2016-07-10 (×8): qty 2
  Filled 2016-07-10: qty 1
  Filled 2016-07-10 (×2): qty 2

## 2016-07-10 MED ORDER — ASPIRIN EC 81 MG PO TBEC
81.0000 mg | DELAYED_RELEASE_TABLET | Freq: Every day | ORAL | Status: DC
  Administered 2016-07-10: 81 mg via ORAL
  Filled 2016-07-10 (×3): qty 1

## 2016-07-10 MED ORDER — SILDENAFIL CITRATE 100 MG PO TABS: 100.0000 mg | ORAL_TABLET | Freq: Every day | ORAL | Status: DC | PRN

## 2016-07-10 MED ORDER — FENTANYL CITRATE (PF) 100 MCG/2ML IJ SOLN
INTRAMUSCULAR | Status: AC
  Filled 2016-07-10: qty 2

## 2016-07-10 MED ORDER — CEFAZOLIN SODIUM-DEXTROSE 2-4 GM/100ML-% IV SOLN
2.0000 g | INTRAVENOUS | Status: AC
  Administered 2016-07-10: 2 g via INTRAVENOUS
  Filled 2016-07-10: qty 100

## 2016-07-10 MED ORDER — TRANEXAMIC ACID 1000 MG/10ML IV SOLN
INTRAVENOUS | Status: DC | PRN
  Administered 2016-07-10: 2000 mg via TOPICAL

## 2016-07-10 MED ORDER — METHOCARBAMOL 500 MG PO TABS: 500.0000 mg | ORAL_TABLET | Freq: Three times a day (TID) | ORAL | 1 refills | Status: DC | PRN

## 2016-07-10 MED ORDER — FUROSEMIDE 40 MG PO TABS: 40.0000 mg | ORAL_TABLET | Freq: Every day | ORAL | Status: DC | PRN

## 2016-07-10 MED ORDER — COUMADIN BOOK
Freq: Once | Status: AC
  Administered 2016-07-10: 13:00:00
  Filled 2016-07-10: qty 1

## 2016-07-10 MED ORDER — POLYETHYLENE GLYCOL 3350 17 G PO PACK
17.0000 g | PACK | Freq: Every day | ORAL | Status: DC | PRN
  Administered 2016-07-12: 17 g via ORAL
  Filled 2016-07-10 (×2): qty 1

## 2016-07-10 MED ORDER — ACETAMINOPHEN 500 MG PO TABS
1000.0000 mg | ORAL_TABLET | Freq: Four times a day (QID) | ORAL | Status: DC | PRN
  Administered 2016-07-11: 1000 mg via ORAL
  Filled 2016-07-10: qty 2

## 2016-07-10 MED ORDER — FERROUS SULFATE 325 (65 FE) MG PO TABS
325.0000 mg | ORAL_TABLET | Freq: Three times a day (TID) | ORAL | Status: DC
  Administered 2016-07-10 – 2016-07-13 (×7): 325 mg via ORAL
  Filled 2016-07-10 (×7): qty 1

## 2016-07-10 MED ORDER — FINASTERIDE 5 MG PO TABS
5.0000 mg | ORAL_TABLET | Freq: Every evening | ORAL | Status: DC
  Administered 2016-07-10 – 2016-07-12 (×2): 5 mg via ORAL
  Filled 2016-07-10 (×2): qty 1

## 2016-07-10 MED ORDER — DOCUSATE SODIUM 100 MG PO CAPS
100.0000 mg | ORAL_CAPSULE | Freq: Two times a day (BID) | ORAL | Status: DC
  Administered 2016-07-10 – 2016-07-13 (×7): 100 mg via ORAL
  Filled 2016-07-10 (×7): qty 1

## 2016-07-10 MED ORDER — WARFARIN SODIUM 7.5 MG PO TABS
7.5000 mg | ORAL_TABLET | Freq: Once | ORAL | Status: AC
  Administered 2016-07-10: 7.5 mg via ORAL
  Filled 2016-07-10: qty 1

## 2016-07-10 MED ORDER — ONDANSETRON HCL 4 MG PO TABS: 4.0000 mg | ORAL_TABLET | Freq: Four times a day (QID) | ORAL | Status: DC | PRN

## 2016-07-10 MED ORDER — ACETAMINOPHEN 650 MG RE SUPP: 650.0000 mg | Freq: Four times a day (QID) | RECTAL | Status: DC | PRN

## 2016-07-10 MED ORDER — BISACODYL 10 MG RE SUPP
10.0000 mg | Freq: Every day | RECTAL | Status: DC | PRN
  Filled 2016-07-10: qty 1

## 2016-07-10 MED ORDER — MEPERIDINE HCL 25 MG/ML IJ SOLN: 6.2500 mg | INTRAMUSCULAR | Status: DC | PRN

## 2016-07-10 MED ORDER — CEFAZOLIN SODIUM-DEXTROSE 2-4 GM/100ML-% IV SOLN
2.0000 g | Freq: Four times a day (QID) | INTRAVENOUS | Status: AC
Start: 2016-07-10 — End: 2016-07-10
  Administered 2016-07-10 (×2): 2 g via INTRAVENOUS
  Filled 2016-07-10 (×2): qty 100

## 2016-07-10 MED ORDER — EPHEDRINE SULFATE-NACL 50-0.9 MG/10ML-% IV SOSY
PREFILLED_SYRINGE | INTRAVENOUS | Status: DC | PRN
  Administered 2016-07-10: 5 mg via INTRAVENOUS

## 2016-07-10 MED ORDER — WARFARIN - PHARMACIST DOSING INPATIENT: Freq: Every day | Status: DC

## 2016-07-10 MED ORDER — BUPIVACAINE IN DEXTROSE 0.75-8.25 % IT SOLN
INTRATHECAL | Status: DC | PRN
  Administered 2016-07-10: 1.6 mL via INTRATHECAL

## 2016-07-10 MED ORDER — PROPOFOL 500 MG/50ML IV EMUL
INTRAVENOUS | Status: DC | PRN
  Administered 2016-07-10: 100 ug/kg/min via INTRAVENOUS

## 2016-07-10 MED ORDER — PHENOL 1.4 % MT LIQD: 1.0000 | OROMUCOSAL | Status: DC | PRN

## 2016-07-10 MED ORDER — PROPOFOL 10 MG/ML IV BOLUS
INTRAVENOUS | Status: DC | PRN
  Administered 2016-07-10: 20 mg via INTRAVENOUS
  Administered 2016-07-10: 10 mg via INTRAVENOUS

## 2016-07-10 MED ORDER — LACTATED RINGERS IV SOLN
INTRAVENOUS | Status: DC | PRN
  Administered 2016-07-10 (×2): via INTRAVENOUS

## 2016-07-10 MED ORDER — 0.9 % SODIUM CHLORIDE (POUR BTL) OPTIME
TOPICAL | Status: DC | PRN
  Administered 2016-07-10: 1000 mL

## 2016-07-10 MED ORDER — PROPOFOL 10 MG/ML IV BOLUS
INTRAVENOUS | Status: AC
  Filled 2016-07-10: qty 20

## 2016-07-10 MED ORDER — OXYCODONE-ACETAMINOPHEN 5-325 MG PO TABS: 1.0000 | ORAL_TABLET | ORAL | 0 refills | Status: DC | PRN

## 2016-07-10 MED ORDER — ACETAMINOPHEN 325 MG PO TABS: 650.0000 mg | ORAL_TABLET | Freq: Four times a day (QID) | ORAL | Status: DC | PRN

## 2016-07-10 MED ORDER — MENTHOL 3 MG MT LOZG: 1.0000 | LOZENGE | OROMUCOSAL | Status: DC | PRN

## 2016-07-10 MED ORDER — HYDROMORPHONE HCL 2 MG/ML IJ SOLN
0.5000 mg | INTRAMUSCULAR | Status: DC | PRN
  Administered 2016-07-10: 1 mg via INTRAVENOUS
  Filled 2016-07-10: qty 1

## 2016-07-10 MED ORDER — GABAPENTIN 300 MG PO CAPS
300.0000 mg | ORAL_CAPSULE | Freq: Two times a day (BID) | ORAL | Status: DC
  Administered 2016-07-10 – 2016-07-13 (×6): 300 mg via ORAL
  Filled 2016-07-10 (×7): qty 1

## 2016-07-10 MED ORDER — FENTANYL CITRATE (PF) 100 MCG/2ML IJ SOLN: 25.0000 ug | INTRAMUSCULAR | Status: DC | PRN

## 2016-07-10 MED ORDER — FENTANYL CITRATE (PF) 100 MCG/2ML IJ SOLN
INTRAMUSCULAR | Status: DC | PRN
  Administered 2016-07-10: 50 ug via INTRAVENOUS

## 2016-07-10 MED ORDER — SODIUM CHLORIDE 0.9 % IV SOLN
INTRAVENOUS | Status: DC
  Administered 2016-07-10: 14:00:00 via INTRAVENOUS

## 2016-07-10 MED ORDER — SPIRONOLACTONE 25 MG PO TABS
25.0000 mg | ORAL_TABLET | Freq: Every day | ORAL | Status: DC
  Administered 2016-07-10 – 2016-07-13 (×3): 25 mg via ORAL
  Filled 2016-07-10 (×4): qty 1

## 2016-07-10 MED ORDER — OXYCODONE HCL 5 MG PO TABS
ORAL_TABLET | ORAL | Status: AC
  Filled 2016-07-10: qty 1

## 2016-07-10 MED ORDER — ADULT MULTIVITAMIN W/MINERALS CH
1.0000 | ORAL_TABLET | Freq: Every day | ORAL | Status: DC
  Administered 2016-07-10: 1 via ORAL
  Filled 2016-07-10 (×3): qty 1

## 2016-07-10 MED ORDER — WARFARIN SODIUM 5 MG PO TABS: 5.0000 mg | ORAL_TABLET | Freq: Every day | ORAL | 0 refills | Status: DC

## 2016-07-10 MED ORDER — ALFUZOSIN HCL ER 10 MG PO TB24
10.0000 mg | ORAL_TABLET | Freq: Every day | ORAL | Status: DC
  Administered 2016-07-10 – 2016-07-12 (×3): 10 mg via ORAL
  Filled 2016-07-10 (×3): qty 1

## 2016-07-10 MED ORDER — PANTOPRAZOLE SODIUM 40 MG PO TBEC
40.0000 mg | DELAYED_RELEASE_TABLET | Freq: Every day | ORAL | Status: DC
  Administered 2016-07-11 – 2016-07-13 (×3): 40 mg via ORAL
  Filled 2016-07-10 (×4): qty 1

## 2016-07-10 MED ORDER — METHOCARBAMOL 500 MG PO TABS
500.0000 mg | ORAL_TABLET | Freq: Four times a day (QID) | ORAL | Status: DC | PRN
  Administered 2016-07-10 – 2016-07-11 (×5): 500 mg via ORAL
  Filled 2016-07-10 (×4): qty 1

## 2016-07-10 MED ORDER — FERROUS SULFATE 325 (65 FE) MG PO TBEC
325.0000 mg | DELAYED_RELEASE_TABLET | Freq: Every evening | ORAL | Status: DC
  Administered 2016-07-12: 325 mg via ORAL
  Filled 2016-07-10 (×4): qty 1

## 2016-07-10 MED ORDER — ENOXAPARIN SODIUM 30 MG/0.3ML ~~LOC~~ SOLN
30.0000 mg | Freq: Two times a day (BID) | SUBCUTANEOUS | Status: DC
  Administered 2016-07-11 – 2016-07-13 (×5): 30 mg via SUBCUTANEOUS
  Filled 2016-07-10 (×5): qty 0.3

## 2016-07-10 MED ORDER — LORATADINE 10 MG PO TABS
10.0000 mg | ORAL_TABLET | Freq: Every day | ORAL | Status: DC
  Administered 2016-07-11 – 2016-07-13 (×3): 10 mg via ORAL
  Filled 2016-07-10 (×4): qty 1

## 2016-07-10 MED ORDER — METHOCARBAMOL 500 MG PO TABS
ORAL_TABLET | ORAL | Status: AC
  Filled 2016-07-10: qty 1

## 2016-07-10 MED ORDER — VITAMIN C 500 MG PO TABS
500.0000 mg | ORAL_TABLET | Freq: Two times a day (BID) | ORAL | Status: DC
Start: 2016-07-10 — End: 2016-07-13
  Administered 2016-07-10 – 2016-07-13 (×7): 500 mg via ORAL
  Filled 2016-07-10 (×7): qty 1

## 2016-07-10 MED ORDER — BUPIVACAINE-EPINEPHRINE (PF) 0.5% -1:200000 IJ SOLN
INTRAMUSCULAR | Status: DC | PRN
  Administered 2016-07-10: 20 mL via PERINEURAL

## 2016-07-10 MED ORDER — SILDENAFIL CITRATE 20 MG PO TABS: 20.0000 mg | ORAL_TABLET | Freq: Every day | ORAL | Status: DC | PRN

## 2016-07-10 MED ORDER — TRANEXAMIC ACID 1000 MG/10ML IV SOLN
2000.0000 mg | Freq: Once | INTRAVENOUS | Status: DC
  Filled 2016-07-10: qty 20

## 2016-07-10 SURGICAL SUPPLY — 63 items
BAG DECANTER FOR FLEXI CONT (MISCELLANEOUS) ×3 IMPLANT
BANDAGE ESMARK 6X9 LF (GAUZE/BANDAGES/DRESSINGS) ×1 IMPLANT
BLADE SAG 18X100X1.27 (BLADE) ×3 IMPLANT
BLADE SAW SGTL 13X75X1.27 (BLADE) ×3 IMPLANT
BLADE SAW SGTL 18X1.27X75 (BLADE) ×2 IMPLANT
BLADE SAW SGTL 18X1.27X75MM (BLADE) ×1
BNDG ELASTIC 6X10 VLCR STRL LF (GAUZE/BANDAGES/DRESSINGS) ×3 IMPLANT
BNDG ESMARK 6X9 LF (GAUZE/BANDAGES/DRESSINGS) ×3
BNDG GAUZE ELAST 4 BULKY (GAUZE/BANDAGES/DRESSINGS) ×6 IMPLANT
BOWL SMART MIX CTS (DISPOSABLE) ×3 IMPLANT
CAPT KNEE TOTAL 3 ATTUNE ×3 IMPLANT
CEMENT HV SMART SET (Cement) ×6 IMPLANT
CLOSURE WOUND 1/2 X4 (GAUZE/BANDAGES/DRESSINGS) ×1
COVER SURGICAL LIGHT HANDLE (MISCELLANEOUS) ×3 IMPLANT
CUFF TOURNIQUET SINGLE 34IN LL (TOURNIQUET CUFF) ×3 IMPLANT
CUFF TOURNIQUET SINGLE 44IN (TOURNIQUET CUFF) IMPLANT
DRAPE HALF SHEET 40X57 (DRAPES) ×3 IMPLANT
DRAPE IMP U-DRAPE 54X76 (DRAPES) ×3 IMPLANT
DRAPE PROXIMA HALF (DRAPES) ×3 IMPLANT
DRAPE U-SHAPE 47X51 STRL (DRAPES) ×3 IMPLANT
DRSG ADAPTIC 3X8 NADH LF (GAUZE/BANDAGES/DRESSINGS) ×3 IMPLANT
DRSG PAD ABDOMINAL 8X10 ST (GAUZE/BANDAGES/DRESSINGS) ×3 IMPLANT
DURAPREP 26ML APPLICATOR (WOUND CARE) ×3 IMPLANT
ELECT CAUTERY BLADE 6.4 (BLADE) ×3 IMPLANT
ELECT REM PT RETURN 9FT ADLT (ELECTROSURGICAL) ×3
ELECTRODE REM PT RTRN 9FT ADLT (ELECTROSURGICAL) ×1 IMPLANT
GAUZE SPONGE 4X4 12PLY STRL (GAUZE/BANDAGES/DRESSINGS) ×3 IMPLANT
GLOVE BIOGEL PI ORTHO PRO 7.5 (GLOVE) ×2
GLOVE BIOGEL PI ORTHO PRO SZ7 (GLOVE) ×2
GLOVE BIOGEL PI ORTHO PRO SZ8 (GLOVE) ×2
GLOVE ORTHO TXT STRL SZ7.5 (GLOVE) ×3 IMPLANT
GLOVE PI ORTHO PRO STRL 7.5 (GLOVE) ×1 IMPLANT
GLOVE PI ORTHO PRO STRL SZ7 (GLOVE) ×1 IMPLANT
GLOVE PI ORTHO PRO STRL SZ8 (GLOVE) ×1 IMPLANT
GLOVE SURG ORTHO 8.5 STRL (GLOVE) ×3 IMPLANT
GOWN STRL REUS W/ TWL XL LVL3 (GOWN DISPOSABLE) ×3 IMPLANT
GOWN STRL REUS W/TWL XL LVL3 (GOWN DISPOSABLE) ×6
HANDPIECE INTERPULSE COAX TIP (DISPOSABLE) ×2
IMMOBILIZER KNEE 22 UNIV (SOFTGOODS) ×3 IMPLANT
KIT BASIN OR (CUSTOM PROCEDURE TRAY) ×3 IMPLANT
KIT MANIFOLD (MISCELLANEOUS) IMPLANT
KIT ROOM TURNOVER OR (KITS) ×3 IMPLANT
MANIFOLD NEPTUNE II (INSTRUMENTS) ×3 IMPLANT
NS IRRIG 1000ML POUR BTL (IV SOLUTION) ×3 IMPLANT
PACK TOTAL JOINT (CUSTOM PROCEDURE TRAY) ×3 IMPLANT
PACK UNIVERSAL I (CUSTOM PROCEDURE TRAY) IMPLANT
PAD ARMBOARD 7.5X6 YLW CONV (MISCELLANEOUS) ×3 IMPLANT
SET HNDPC FAN SPRY TIP SCT (DISPOSABLE) ×1 IMPLANT
STRIP CLOSURE SKIN 1/2X4 (GAUZE/BANDAGES/DRESSINGS) ×2 IMPLANT
SUCTION FRAZIER HANDLE 10FR (MISCELLANEOUS) ×2
SUCTION TUBE FRAZIER 10FR DISP (MISCELLANEOUS) ×1 IMPLANT
SUT MNCRL AB 3-0 PS2 18 (SUTURE) ×3 IMPLANT
SUT VIC AB 0 CT1 27 (SUTURE) ×4
SUT VIC AB 0 CT1 27XBRD ANBCTR (SUTURE) ×2 IMPLANT
SUT VIC AB 1 CT1 27 (SUTURE) ×6
SUT VIC AB 1 CT1 27XBRD ANBCTR (SUTURE) ×3 IMPLANT
SUT VIC AB 2-0 CT1 27 (SUTURE) ×4
SUT VIC AB 2-0 CT1 TAPERPNT 27 (SUTURE) ×2 IMPLANT
TOWEL OR 17X24 6PK STRL BLUE (TOWEL DISPOSABLE) ×3 IMPLANT
TOWEL OR 17X26 10 PK STRL BLUE (TOWEL DISPOSABLE) ×3 IMPLANT
TRAY CATH 16FR W/PLASTIC CATH (SET/KITS/TRAYS/PACK) IMPLANT
TRAY FOLEY CATH 16FRSI W/METER (SET/KITS/TRAYS/PACK) ×3 IMPLANT
WATER STERILE IRR 1000ML POUR (IV SOLUTION) IMPLANT

## 2016-07-10 NOTE — Anesthesia Preprocedure Evaluation (Addendum)
Anesthesia Evaluation  Patient identified by MRN, date of birth, ID band Patient awake    Reviewed: Allergy & Precautions, NPO status , Patient's Chart, lab work & pertinent test results  Airway Mallampati: II  TM Distance: >3 FB Neck ROM: Full    Dental no notable dental hx. (+) Teeth Intact   Pulmonary sleep apnea ,    Pulmonary exam normal breath sounds clear to auscultation       Cardiovascular hypertension, + CAD, + Past MI and + Cardiac Stents (2007)  Normal cardiovascular exam Rhythm:Regular Rate:Normal     Neuro/Psych negative neurological ROS  negative psych ROS   GI/Hepatic negative GI ROS, Neg liver ROS,   Endo/Other  negative endocrine ROS  Renal/GU negative Renal ROS  negative genitourinary   Musculoskeletal   Abdominal   Peds negative pediatric ROS (+)  Hematology negative hematology ROS (+)   Anesthesia Other Findings   Reproductive/Obstetrics negative OB ROS                           Anesthesia Physical Anesthesia Plan  ASA: III  Anesthesia Plan: Spinal   Post-op Pain Management:  Regional for Post-op pain   Induction: Intravenous  Airway Management Planned: Simple Face Mask  Additional Equipment:   Intra-op Plan:   Post-operative Plan: Extubation in OR  Informed Consent: I have reviewed the patients History and Physical, chart, labs and discussed the procedure including the risks, benefits and alternatives for the proposed anesthesia with the patient or authorized representative who has indicated his/her understanding and acceptance.   Dental advisory given  Plan Discussed with: CRNA  Anesthesia Plan Comments:         Anesthesia Quick Evaluation

## 2016-07-10 NOTE — Anesthesia Procedure Notes (Signed)
Procedure Name: MAC Date/Time: 07/10/2016 7:38 AM Performed by: Kyung Rudd Pre-anesthesia Checklist: Patient identified, Emergency Drugs available, Suction available and Patient being monitored Patient Re-evaluated:Patient Re-evaluated prior to inductionOxygen Delivery Method: Simple face mask Intubation Type: IV induction Placement Confirmation: positive ETCO2

## 2016-07-10 NOTE — Progress Notes (Signed)
ANTICOAGULATION CONSULT NOTE - Initial Consult  Pharmacy Consult:  Coumadin Indication:  VTE prophylaxis  Allergies  Allergen Reactions  . Shellfish Allergy Nausea And Vomiting  . Sulfa Antibiotics Other (See Comments)    Feels fatigued and weak when taking sulfa for long durations     Patient Measurements: Height: 5\' 6"  (167.6 cm) Weight: 177 lb (80.3 kg) IBW/kg (Calculated) : 63.8  Vital Signs: Temp: 97.1 F (36.2 C) (12/01 1219) Temp Source: Oral (12/01 0626) BP: 139/88 (12/01 1219) Pulse Rate: 70 (12/01 1219)  Labs: No results for input(s): HGB, HCT, PLT, APTT, LABPROT, INR, HEPARINUNFRC, HEPRLOWMOCWT, CREATININE, CKTOTAL, CKMB, TROPONINI in the last 72 hours.  Estimated Creatinine Clearance: 54.6 mL/min (by C-G formula based on SCr of 1.11 mg/dL).   Medical History: Past Medical History:  Diagnosis Date  . Acid reflux    history of Barretts esophagus   . Adenomatous colon polyp 01/2002  . Arthritis 2004  . Coronary artery disease 2007   cardiac cath w/ angioplasty & stent placement x 1  . Enlarged prostate   . Hyperlipidemia   . Hypertension   . Myocardial infarction 2007   while vacationing in [place], cath. done there & x1 stent placed   . Neuromuscular disorder ([place])    carpal tunnel - both hands   . Prostate cancer [facility]) 2013   biopsy- active survelliance, annual biopsies   . Sepsis secondary to UTI ([place])   . Shortness of breath dyspnea   . Sleep apnea 2001  . Venous insufficiency      Assessment: 80 YOM to start Lovenox and Coumadin for VTE prophylaxis s/p left TKA.  No baseline INR; however, patient is not on blood thinners PTA.  No bleeding reported.   Goal of Therapy:  INR 2-3 Monitor platelets by anticoagulation protocol: Yes    Plan:  - Coumadin 7.5mg  PO today - Lovenox 30mg  SQ Q12H until INR </= 1.8 - Daily PT / INR - Coumadin book   Kojo Liby D. Mina Marble, PharmD, BCPS Pager:  [phone] 07/10/2016, 1:03 PM

## 2016-07-10 NOTE — Brief Op Note (Signed)
07/10/2016  9:40 AM  PATIENT:  Scott [name]  80 y.o. male  PRE-OPERATIVE DIAGNOSIS:  LEFT KNEE OA, END STAGE  POST-OPERATIVE DIAGNOSIS:  LEFT KNEE OA, END STAGE  PROCEDURE:  Procedure(s): TOTAL KNEE ARTHROPLASTY (Left) DePuy Attune   SURGEON:  Surgeon(s) and Role:    * Netta Cedars, MD - Primary  PHYSICIAN ASSISTANT:   ASSISTANTS: Ventura Bruns, PA-C   ANESTHESIA:   spinal  EBL:  Total I/O In: -  Out: 425 [Urine:375; Blood:50]  BLOOD ADMINISTERED:none  DRAINS: none   LOCAL MEDICATIONS USED:  none    SPECIMEN:  No Specimen  DISPOSITION OF SPECIMEN:  N/A  COUNTS:  YES  TOURNIQUET:   Total Tourniquet Time Documented: Thigh (Left) - 102 minutes Total: Thigh (Left) - 102 minutes   DICTATION: .Other Dictation: Dictation Number [id number]  PLAN OF CARE: Admit to inpatient   PATIENT DISPOSITION:  PACU - hemodynamically stable.   Delay start of Pharmacological VTE agent (>24hrs) due to surgical blood loss or risk of bleeding: no

## 2016-07-10 NOTE — Interval H&P Note (Signed)
History and Physical Interval Note:  07/10/2016 7:20 AM  Scott [name]  has presented today for surgery, with the diagnosis of LEFT KNEE OA, AND STAGE  The various methods of treatment have been discussed with the patient and family. After consideration of risks, benefits and other options for treatment, the patient has consented to  Procedure(s): TOTAL KNEE ARTHROPLASTY (Left) as a surgical intervention .  The patient's history has been reviewed, patient examined, no change in status, stable for surgery.  I have reviewed the patient's chart and labs.  Questions were answered to the patient's satisfaction.     Kaladin Noseworthy,STEVEN R

## 2016-07-10 NOTE — Anesthesia Procedure Notes (Addendum)
Anesthesia Regional Block:  Adductor canal block  Pre-Anesthetic Checklist: ,, timeout performed, Correct Patient, Correct Site, Correct Laterality, Correct Procedure, Correct Position, site marked, Risks and benefits discussed,  Surgical consent,  Pre-op evaluation,  At surgeon's request and post-op pain management  Laterality: Left and Lower  Prep: Maximum Sterile Barrier Precautions used, chloraprep       Needles:  Injection technique: Single-shot  Needle Type: Echogenic Stimulator Needle     Needle Length: 10cm 10 cm Needle Gauge: 21 G    Additional Needles:  Procedures: ultrasound guided (picture in chart) Adductor canal block Narrative:  Start time: 07/10/2016 7:00 AM End time: 07/10/2016 7:05 AM Injection made incrementally with aspirations every 5 mL.  Performed by: Personally  Anesthesiologist: Montez Hageman  Additional Notes: Risks, benefits and alternative to block explained extensively.  Patient tolerated procedure well, without complications.

## 2016-07-10 NOTE — Anesthesia Postprocedure Evaluation (Signed)
Anesthesia Post Note  Patient: Scott [name]  Procedure(s) Performed: Procedure(s) (LRB): TOTAL KNEE ARTHROPLASTY (Left)  Patient location during evaluation: PACU Anesthesia Type: Spinal and Regional Level of consciousness: awake and alert Pain management: pain level controlled Vital Signs Assessment: post-procedure vital signs reviewed and stable Respiratory status: spontaneous breathing and respiratory function stable Cardiovascular status: blood pressure returned to baseline and stable Postop Assessment: no headache, no backache and spinal receding Anesthetic complications: no    Last Vitals:  Vitals:   07/10/16 1115 07/10/16 1143  BP:    Pulse: (!) 55 (!) 54  Resp: 13 (!) 23  Temp:      Last Pain:  Vitals:   07/10/16 1143  TempSrc:   PainSc: 6                  Montez Hageman

## 2016-07-10 NOTE — Transfer of Care (Signed)
Immediate Anesthesia Transfer of Care Note  Patient: Scott [name]  Procedure(s) Performed: Procedure(s): TOTAL KNEE ARTHROPLASTY (Left)  Patient Location: PACU  Anesthesia Type:Spinal  Level of Consciousness: awake, alert  and oriented  Airway & Oxygen Therapy: Patient Spontanous Breathing and Patient connected to face mask oxygen  Post-op Assessment: Report given to RN, Post -op Vital signs reviewed and stable and Patient moving all extremities  Post vital signs: Reviewed and stable  Last Vitals:  Vitals:   07/10/16 0626  BP: 117/65  Pulse: 61  Resp: 20  Temp: 36.7 C    Last Pain:  Vitals:   07/10/16 0626  TempSrc: Oral      Patients Stated Pain Goal: 2 ([id number])  Complications: No apparent anesthesia complications

## 2016-07-10 NOTE — Evaluation (Signed)
Physical Therapy Evaluation Patient Details Name: Scott [name] MRN: [id number] DOB: [birth year] Today's Date: 07/10/2016   History of Present Illness  80 yo male admitted on 07/10/16 for Left TKA. Pt is electing to go to SNF upon DC for short term rehab. PMH significant for CAD with stent placement '07, HTN, MI, Prostate CA.  Clinical Impression  Pt is POD 0 and moving well with therapy. Pt is limited by fatigue this session and has some c/o numbess in LLE with gait. Performed ROM measurements which are noted below. Pt is able to get OOB to chair with Min A for safety. Prior to admission, pt was independent with all daily activities including driving himself to run errands. Currently, pt presents with below limitations and has elected to go to SNF for rehab in order to maximize his recovery. Pt's wife is unable to assist him if he returns home. Pt will benefit from being seen acutely in order to address below deficits and assist with return to PLOF.    Follow Up Recommendations SNF    Equipment Recommendations  None recommended by PT;Other (comment) (to be decided at next venue of care)    Recommendations for Other Services       Precautions / Restrictions Precautions Precautions: Knee Precaution Booklet Issued: Yes (comment) Precaution Comments: reviewed no pillow under knee Required Braces or Orthoses: Knee Immobilizer - Left Knee Immobilizer - Left: On when out of bed or walking Restrictions Weight Bearing Restrictions: Yes LLE Weight Bearing: Weight bearing as tolerated      Mobility  Bed Mobility Overal bed mobility: Needs Assistance Bed Mobility: Supine to Sit     Supine to sit: Min assist     General bed mobility comments: Min A to assist LLE OOB  Transfers Overall transfer level: Needs assistance Equipment used: Rolling walker (2 wheeled) Transfers: Sit to/from Stand Sit to Stand: Min assist         General transfer comment: Min A from EOB to RW    Ambulation/Gait Ambulation/Gait assistance: Min assist Ambulation Distance (Feet): 10 Feet Assistive device: Rolling walker (2 wheeled) Gait Pattern/deviations: Step-to pattern;Decreased step length - right;Decreased stance time - left;Antalgic Gait velocity: decreased Gait velocity interpretation: Below normal speed for age/gender General Gait Details: moderate antalgic gait with KI in place from bed to chair  Stairs            Wheelchair Mobility    Modified Rankin (Stroke Patients Only)       Balance                                             Pertinent Vitals/Pain Pain Assessment: 0-10 Pain Score: 6  Pain Location: left knee Pain Descriptors / Indicators: Burning Pain Intervention(s): Monitored during session;Premedicated before session;Ice applied    Home Living Family/patient expects to be discharged to:: Skilled nursing facility                      Prior Function Level of Independence: Independent         Comments: was driving, doing yard work, running errands     Hand Dominance   Dominant Hand: Left    Extremity/Trunk Assessment   Upper Extremity Assessment: Defer to OT evaluation           Lower Extremity Assessment: LLE deficits/detail   LLE Deficits /  Details: pt with normal post op pain and weakness. At least 3/5 ankle, knee and hip per gross functional assessment     Communication   Communication: No difficulties  Cognition Arousal/Alertness: Awake/alert Behavior During Therapy: WFL for tasks assessed/performed Overall Cognitive Status: Within Functional Limits for tasks assessed                      General Comments      Exercises Total Joint Exercises Ankle Circles/Pumps: AROM;20 reps;Both;Supine Quad Sets: AROM;Left;10 reps;Supine Heel Slides: AAROM;Left;10 reps;Supine Goniometric ROM: 2-90   Assessment/Plan    PT Assessment Patient needs continued PT services  PT Problem List  Decreased strength;Decreased range of motion;Decreased activity tolerance;Decreased balance;Decreased mobility;Decreased knowledge of use of DME;Pain          PT Treatment Interventions DME instruction;Gait training;Functional mobility training;Therapeutic activities;Therapeutic exercise;Balance training;Patient/family education    PT Goals (Current goals can be found in the Care Plan section)  Acute Rehab PT Goals Patient Stated Goal: to get home  PT Goal Formulation: With patient Time For Goal Achievement: 07/17/16 Potential to Achieve Goals: Good    Frequency 7X/week   Barriers to discharge        Co-evaluation               End of Session Equipment Utilized During Treatment: Gait belt Activity Tolerance: Patient tolerated treatment well Patient left: in chair;with call bell/phone within reach Nurse Communication: Mobility status         Time: 1400-1426 PT Time Calculation (min) (ACUTE ONLY): 26 min   Charges:   PT Evaluation $PT Eval Low Complexity: 1 Procedure PT Treatments $Therapeutic Activity: 8-22 mins   PT G Codes:        Scheryl Marten PT, DPT  [phone]  07/10/2016, 2:37 PM

## 2016-07-10 NOTE — Progress Notes (Signed)
Orthopedic Tech Progress Note Patient Details:  Scott [name] 1936-05-06 [id number]  CPM Left Knee CPM Left Knee: On Left Knee Flexion (Degrees): 90 Left Knee Extension (Degrees): 0 Additional Comments: trapeze bar patient helper   [name] 07/10/2016, 10:41 AM Viewed order from doctor's order list

## 2016-07-10 NOTE — Anesthesia Procedure Notes (Signed)
Spinal  Patient location during procedure: OR Staffing Anesthesiologist: Ousmane Seeman Performed: anesthesiologist  Preanesthetic Checklist Completed: patient identified, site marked, surgical consent, pre-op evaluation, timeout performed, IV checked, risks and benefits discussed and monitors and equipment checked Spinal Block Patient position: sitting Prep: Betadine Patient monitoring: heart rate, continuous pulse ox and blood pressure Approach: right paramedian Location: L3-4 Injection technique: single-shot Needle Needle type: Spinocan  Needle gauge: 22 G Needle length: 9 cm Additional Notes Expiration date of kit checked and confirmed. Patient tolerated procedure well, without complications.       

## 2016-07-11 LAB — BASIC METABOLIC PANEL
ANION GAP: 9 (ref 5–15)
BUN: 23 mg/dL — ABNORMAL HIGH (ref 6–20)
CHLORIDE: 103 mmol/L (ref 101–111)
CO2: 24 mmol/L (ref 22–32)
Calcium: 8.4 mg/dL — ABNORMAL LOW (ref 8.9–10.3)
Creatinine, Ser: 1.26 mg/dL — ABNORMAL HIGH (ref 0.61–1.24)
GFR calc Af Amer: >60 mL/min (ref >60)
GFR, EST NON AFRICAN AMERICAN: 53 mL/min — AB (ref >60)
GLUCOSE: 121 mg/dL — AB (ref 65–99)
POTASSIUM: 4.3 mmol/L (ref 3.5–5.1)
Sodium: 136 mmol/L (ref 135–145)

## 2016-07-11 LAB — CBC
HEMATOCRIT: 38.1 % — AB (ref 39.0–52.0)
HEMOGLOBIN: 12.3 g/dL — AB (ref 13.0–17.0)
MCH: 29.6 pg (ref 26.0–34.0)
MCHC: 32.3 g/dL (ref 30.0–36.0)
MCV: 91.6 fL (ref 78.0–100.0)
PLATELETS: 185 10*3/uL (ref 150–400)
RBC: 4.16 MIL/uL — AB (ref 4.22–5.81)
RDW: 13.6 % (ref 11.5–15.5)
WBC: 13.2 10*3/uL — AB (ref 4.0–10.5)

## 2016-07-11 LAB — PROTIME-INR
INR: 1.15
Prothrombin Time: 14.8 seconds (ref 11.4–15.2)

## 2016-07-11 MED ORDER — ADULT MULTIVITAMIN W/MINERALS CH
1.0000 | ORAL_TABLET | Freq: Every day | ORAL | Status: DC
Start: 2016-07-11 — End: 2016-07-13
  Administered 2016-07-11 – 2016-07-12 (×2): 1 via ORAL
  Filled 2016-07-11 (×2): qty 1

## 2016-07-11 MED ORDER — ASPIRIN EC 81 MG PO TBEC
81.0000 mg | DELAYED_RELEASE_TABLET | Freq: Every day | ORAL | Status: DC
  Administered 2016-07-11 – 2016-07-12 (×2): 81 mg via ORAL
  Filled 2016-07-11 (×2): qty 1

## 2016-07-11 MED ORDER — WARFARIN SODIUM 7.5 MG PO TABS: 7.5000 mg | ORAL_TABLET | Freq: Once | ORAL | Status: DC

## 2016-07-11 NOTE — Progress Notes (Signed)
ANTICOAGULATION CONSULT NOTE - FOLLOW UP  Pharmacy Consult:  Coumadin Indication:  VTE prophylaxis  Allergies  Allergen Reactions  . Shellfish Allergy Nausea And Vomiting  . Sulfa Antibiotics Other (See Comments)    Feels fatigued and weak when taking sulfa for long durations     Patient Measurements: Height: 5\' 6"  (167.6 cm) Weight: 177 lb (80.3 kg) IBW/kg (Calculated) : 63.8  Vital Signs: Temp: 100.5 F (38.1 C) (12/02 0619) Temp Source: Oral (12/02 0619) BP: 144/70 (12/02 0619) Pulse Rate: 87 (12/02 0619)  Labs:  Recent Labs  07/10/16 1418 07/11/16 0612  HGB 13.5 12.3*  HCT 41.2 38.1*  PLT 191 185  LABPROT  --  14.8  INR  --  1.15  CREATININE 1.18 1.26*    Estimated Creatinine Clearance: 48.1 mL/min (by C-G formula based on SCr of 1.26 mg/dL (H)).     Assessment: 21 YOM to start Lovenox and Coumadin for VTE prophylaxis s/p left TKA.  INR sub-therapeutic as expected.  Patient's SCr is trending up, but his CrCL remains acceptable to continue current Lovenox regimen.  No bleeding reported.   Goal of Therapy:  INR 2-3 Monitor platelets by anticoagulation protocol: Yes    Plan:  - Repeat Coumadin 7.5mg  PO today - Lovenox 30mg  SQ Q12H until INR </= 1.8 - Daily PT / INR   Etienne Millward D. Mina Marble, PharmD, BCPS Pager:  [phone] 07/11/2016, 7:49 AM

## 2016-07-11 NOTE — Progress Notes (Signed)
Orthopedic Tech Progress Note Patient Details:  Scott [name] 12-Apr-1936 [id number]  CPM Left Knee CPM Left Knee: Off Left Knee Flexion (Degrees): 90 Left Knee Extension (Degrees): 0 Additional Comments: Pt requested to get out of CPM.  Pt wanted to eat dinner and sit up.  Pt Removed from CPM at this time.   Kristopher Oppenheim 07/11/2016, 4:44 PM

## 2016-07-11 NOTE — Progress Notes (Signed)
Scott [name]  MRN: [id number] DOB/Age: 1936/06/20 80 y.o. Physician: Ander Slade, M.D. 1 Day Post-Op Procedure(s) (LRB): TOTAL KNEE ARTHROPLASTY (Left)  Subjective: Reports "not feeling well" this am. Denies N/V but no appetite. Pain controlled. Vital Signs Temp:  [97.1 F (36.2 C)-100.5 F (38.1 C)] 100.5 F (38.1 C) (12/02 0619) Pulse Rate:  [52-96] 87 (12/02 0619) Resp:  [13-24] 16 (12/01 1500) BP: (105-144)/(68-88) 144/70 (12/02 0619) SpO2:  [92 %-99 %] 92 % (12/02 0619)  Lab Results  Recent Labs  07/10/16 1418 07/11/16 0612  WBC 14.0* 13.2*  HGB 13.5 12.3*  HCT 41.2 38.1*  PLT 191 185   BMET  Recent Labs  07/10/16 1418 07/11/16 0612  NA  --  136  K  --  4.3  CL  --  103  CO2  --  24  GLUCOSE  --  121*  BUN  --  23*  CREATININE 1.18 1.26*  CALCIUM  --  8.4*   INR  Date Value Ref Range Status  07/11/2016 1.15  Final     Exam  Dressings dry RLE, able to prform SLR, fair ankle pumjp ROM, n/v intact distally  Plan D/c foley, advance PT per TKA protocol Scott [name] M Scott [name] 07/11/2016, 10:16 AM    Contact # [phone]

## 2016-07-11 NOTE — Plan of Care (Signed)
Problem: Physical Regulation: Goal: Postoperative complications will be avoided or minimized Outcome: Progressing No post op. Complications noted  Problem: Pain Management: Goal: Pain level will decrease with appropriate interventions Outcome: Progressing Medicated twice for pain with moderate relief

## 2016-07-11 NOTE — Op Note (Signed)
NAMESTEVIE, POISSON NO.:  [id number]  MEDICAL RECORD NO.:  [id number]  LOCATION:  PERIO                        FACILITY:  [facility]  PHYSICIAN:  Doran Heater. Veverly Fells, M.D. DATE OF BIRTH:  [birth year]  DATE OF PROCEDURE:  07/10/2016 DATE OF DISCHARGE:                              OPERATIVE REPORT   PREOPERATIVE DIAGNOSIS:  Left knee end-stage osteoarthritis.  POSTOPERATIVE DIAGNOSIS:  Left knee end-stage osteoarthritis.  PROCEDURE PERFORMED:  Left total knee replacement using DePuy Attune prosthesis.  ATTENDING SURGEON:  Doran Heater. Veverly Fells, MD.  ASSISTANT:  Abbott Pao. Dixon, PA-C who scrubbed for the entire procedure and necessary for satisfactory completion of surgery.  ANESTHESIA:  Spinal anesthesia was used plus MAC.  ESTIMATED BLOOD LOSS:  Minimal.  FLUID REPLACEMENT:  1500 mL crystalloid.  INSTRUMENT COUNTS:  Correct.  COMPLICATIONS:  There were no complications.  ANTIBIOTICS:  Perioperative antibiotics were given.  INDICATIONS:  The patient is a 80 year old male with worsening left knee pain secondary to end-stage arthritis.  The patient has bone-on-bone arthritis on x-ray and has had progressive pain, and now presents for total knee arthroplasty to eliminate pain and restore function to his knee.  Informed consent obtained.  DESCRIPTION OF PROCEDURE:  After an adequate level of anesthesia was achieved, the patient was positioned supine on the operating room table. Left leg correctly identified.  Time-out was called.  A nonsterile tourniquet placed in the left proximal thigh.  Left leg was sterilely prepped and draped in the usual manner.  Following a time-out identifying correct patient and correct site, we elevated and exsanguinated the leg with Esmarch bandage.  We elevated the tourniquet to 300 mmHg, placed the knee in flexion, a longitudinal midline incision created.  We then performed a medial parapatellar arthrotomy dividing the lateral  patellofemoral ligaments and everting the patella.  We entered the distal femur with a step-cut drill and placed intramedullary resection guide for the femur resecting 9 mm of bone set on 5 degrees of valgus to the left knee.  Once we had our distal femoral cut performed, we removed ACL, PCL and meniscal tissue.  We subluxed the tibia anteriorly.  We then performed our tibial cut next which we did an external alignment jig for this.  We cut perpendicular long axis tibia with minimal posterior slope for this posterior cruciate substituting prosthesis.  Once we cut our tibia, we checked our extension gap which we had at least 5 mm gap.  We then went ahead and completed our femoral preparation.  We went ahead and measured the femur which was size 5 anterior down.  We then performed our anterior, posterior and chamfer cuts with a 4-in-1 block.  Next, we removed posterior osteophytes off the femur with the laminar spreader.  We then removed some medial osteophytes off the proximal medial tibia.  We checked our flexion gap which we knew we could get at least a 6 in there.  We removed our pins. We then cut our femoral box cut with the box cutting guide for the size 5 left femur.  Once we had done this, we had our tibial preparation already done with a modular drill and  keel punch, so we had a 5 tibia, a 5 left femur.  We drilled our lug holes.  We were pleased with our fit of our femur and our tibia.  We then placed a 6-mm trial spacer in place and placed the knee in extension.  We definitely gained full extension, felt like we definitely got a 7 at least, if not an 8.  We then did our patellar resection.  We used a patellar clamp and resected 7 mm off a 23- mm thick patella getting it down to 16 mm thickness.  We drilled our lug holes for the 38 patella and placed our 38 patella.  We ranged the knee. We were pleased with that range of motion and tracking with no-touch technique.  We removed all  trial components, pulse irrigated the knee, used a bone plug for the distal femoral canal.  We then cemented all components into place.  After thoroughly pulse irrigating the bone and drying, we cemented the components in place with high-viscosity cement by DePuy.  We placed the knee in extension with a 7-mm poly placed in extension and to compress the cement.  Fit of the components was excellent.  We then cemented our patellar component in place and held it with a patellar clamp.  Once all the cement was hardened, we removed excess cement with 0.25-inch curved osteotome.  We felt like we got an 8- mm trial in place.  There was minimal Shuck with the plate and knee in flexion.  We did select the size 5, 8-mm poly.  Once we had removed the trial and put the real end, we checked for cement one final time.  Once that was all removed, we irrigated the knee thoroughly, ranged the knee, everything tracking well.  We then had nice stability and ability to obtain full extension.  We then repaired the parapatellar arthrotomy, placed our first stitch at the VMO superomedial patellar insertion site, and then placed topical TXA.  We had also given IV TXA for stabilization of bleeding.  We then went ahead and closed the medial parapatellar arthrotomy with #1 Vicryl suture interrupted and then sucked out the remaining TXA as we were just finishing up our closure for that.  Once we had that layer closed, we ranged the knee with excellent tracking, no gapping, no issues with the repair.  We then closed the subcu tissue with 2-0 Vicryl and 4-0 running Monocryl for skin.  Steri-Strips applied followed by sterile dressing.  The patient tolerated the surgery well.     Doran Heater. Veverly Fells, M.D.     SRN/MEDQ  D:  07/10/2016  T:  07/11/2016  Job:  [id number]

## 2016-07-11 NOTE — Progress Notes (Signed)
PT Cancellation Note  Patient Details Name: Scott [name] MRN: [id number] DOB: Feb 03, 1936   Cancelled Treatment:    Reason Eval/Treat Not Completed: Medical issues which prohibited therapy;Fatigue/lethargy limiting ability to participate. Pt reports he is feeling fatigued and no up for walking. Will check back later in the afternoon.    Scheryl Marten PT, DPT  [phone]  07/11/2016, 11:20 AM

## 2016-07-11 NOTE — Progress Notes (Signed)
Physical Therapy Treatment Patient Details Name: Scott [name] MRN: [id number] DOB: Mar 17, 1936 Today's Date: 07/11/2016    History of Present Illness 80 yo male admitted on 07/10/16 for Left TKA. Pt is electing to go to SNF upon DC for short term rehab. PMH significant for CAD with stent placement '07, HTN, MI, Prostate CA.    PT Comments    Pt presents with increased c/o fatigue this session. Performed gait around bed x 30' with decreased cadence and step length noted this session. Pt has been running a low grade fever this date and was not feeling well for therapy a majority of the day. Assisted pt back to bed and applied CPM per his request. Pt with improved bed mobs noted this session and a slight decrease in knee ROM noted.    Follow Up Recommendations  SNF     Equipment Recommendations  None recommended by PT;Other (comment)    Recommendations for Other Services       Precautions / Restrictions Precautions Precautions: Knee Precaution Booklet Issued: Yes (comment) Precaution Comments: reviewed no pillow under knee Required Braces or Orthoses: Knee Immobilizer - Left Knee Immobilizer - Left: On when out of bed or walking Restrictions Weight Bearing Restrictions: Yes    Mobility  Bed Mobility Overal bed mobility: Needs Assistance Bed Mobility: Sit to Supine;Supine to Sit     Supine to sit: Min assist Sit to supine: Min assist   General bed mobility comments: Min A to assist LLE into and out of bed  Transfers Overall transfer level: Needs assistance Equipment used: Rolling walker (2 wheeled) Transfers: Sit to/from Stand Sit to Stand: Min assist         General transfer comment: Min A from EOB to RW   Ambulation/Gait Ambulation/Gait assistance: Min assist Ambulation Distance (Feet): 30 Feet Assistive device: Rolling walker (2 wheeled) Gait Pattern/deviations: Step-to pattern;Decreased step length - right;Decreased stance time - left;Antalgic Gait velocity:  decreased Gait velocity interpretation: Below normal speed for age/gender General Gait Details: moderate antalgic gait with KI in place from bed to chair   Stairs            Wheelchair Mobility    Modified Rankin (Stroke Patients Only)       Balance Overall balance assessment: Needs assistance Sitting-balance support: No upper extremity supported;Feet supported Sitting balance-Leahy Scale: Good     Standing balance support: Bilateral upper extremity supported Standing balance-Leahy Scale: Poor Standing balance comment: Relies on RW for stability in standing                    Cognition Arousal/Alertness: Awake/alert Behavior During Therapy: WFL for tasks assessed/performed Overall Cognitive Status: Within Functional Limits for tasks assessed                      Exercises Total Joint Exercises Ankle Circles/Pumps: AROM;20 reps;Both;Supine Quad Sets: AROM;Left;10 reps;Supine Heel Slides: AAROM;Left;10 reps;Supine Goniometric ROM: 4-80    General Comments        Pertinent Vitals/Pain Pain Assessment: 0-10 Pain Score: 6  Pain Location: L Knee Pain Descriptors / Indicators: Aching;Sore;Guarding Pain Intervention(s): Monitored during session;Premedicated before session;Ice applied    Home Living                      Prior Function            PT Goals (current goals can now be found in the care plan section) Acute Rehab PT Goals  Patient Stated Goal: rehab and then  home  Progress towards PT goals: Progressing toward goals    Frequency    7X/week      PT Plan Current plan remains appropriate    Co-evaluation             End of Session Equipment Utilized During Treatment: Gait belt Activity Tolerance: Patient tolerated treatment well Patient left: in chair;with call bell/phone within reach;in CPM;with SCD's reapplied;with family/visitor present     Time: 1531-1600 PT Time Calculation (min) (ACUTE ONLY): 29  min  Charges:  $Gait Training: 8-22 mins $Therapeutic Activity: 8-22 mins                    G Codes:      Scheryl Marten PT, DPT  [phone]  07/11/2016, 4:48 PM

## 2016-07-11 NOTE — NC FL2 (Signed)
[place] MEDICAID FL2 LEVEL OF CARE SCREENING TOOL     IDENTIFICATION  Patient Name: Scott [name] Birthdate: 12/19/1935 Sex: male Admission Date (Current Location): 07/10/2016  [facility] and [place] Number:  Herbalist and Address:  [facility] [place], [place] [address], [place], [place] [address]      Provider Number: [id number]  Attending Physician Name and Address:  Netta Cedars, MD  Relative Name and Phone Number:       Current Level of Care: Hospital Recommended Level of Care: [place] Prior Approval Number:    Date Approved/Denied: 07/11/16 PASRR Number: [id number]  Discharge Plan: SNF    Current Diagnoses: Patient Active Problem List   Diagnosis Date Noted  . H/O total knee replacement, left 07/10/2016  . UTI (urinary tract infection) 01/30/2016  . Cough 01/30/2016  . Sepsis ([place]) 01/29/2016  . Personal history of colonic polyps 03/07/2012  . Esophageal reflux 03/07/2012    Orientation RESPIRATION BLADDER Height & Weight     Self, Time, Situation, Place  Normal Continent Weight: 177 lb (80.3 kg) Height:  5\' 6"  (167.6 cm)  BEHAVIORAL SYMPTOMS/MOOD NEUROLOGICAL BOWEL NUTRITION STATUS      Continent  (see discharge summary)  AMBULATORY STATUS COMMUNICATION OF NEEDS Skin   Limited Assist Verbally Surgical wounds (Closed incision left knee)                       Personal Care Assistance Level of Assistance  Bathing, Feeding, Dressing Bathing Assistance: Limited assistance Feeding assistance: Independent Dressing Assistance: Limited assistance     Functional Limitations Info  Sight, Hearing, Speech Sight Info: Adequate Hearing Info: Adequate Speech Info: Adequate    SPECIAL CARE FACTORS FREQUENCY  PT (By licensed PT), OT (By licensed OT)     PT Frequency: 7x week OT Frequency: 7x week            Contractures Contractures Info: Not present    Additional Factors Info  Code Status, Allergies Code  Status Info: Full  Allergies Info: Shellfish Allergy, Sulfa Antibiotics           Current Medications (07/11/2016):  This is the current hospital active medication list Current Facility-Administered Medications  Medication Dose Route Frequency Provider Last Rate Last Dose  . 0.9 %  sodium chloride infusion   Intravenous Continuous Netta Cedars, MD 50 mL/hr at 07/10/16 1331    . acetaminophen (TYLENOL) suppository 650 mg  650 mg Rectal Q6H PRN Netta Cedars, MD      . acetaminophen (TYLENOL) tablet 1,000 mg  1,000 mg Oral Q6H PRN Netta Cedars, MD   1,000 mg at 07/11/16 0932  . alfuzosin (UROXATRAL) 24 hr tablet 10 mg  10 mg Oral QHS Netta Cedars, MD   10 mg at 07/10/16 2135  . aspirin EC tablet 81 mg  81 mg Oral QHS Netta Cedars, MD   81 mg at 07/10/16 2135  . atorvastatin (LIPITOR) tablet 40 mg  40 mg Oral QPM Netta Cedars, MD   40 mg at 07/10/16 1735  . bisacodyl (DULCOLAX) suppository 10 mg  10 mg Rectal Daily PRN Netta Cedars, MD      . docusate sodium (COLACE) capsule 100 mg  100 mg Oral BID Netta Cedars, MD   100 mg at 07/11/16 0933  . enoxaparin (LOVENOX) injection 30 mg  30 mg Subcutaneous Q12H Netta Cedars, MD   30 mg at 07/11/16 0932  . ferrous sulfate EC tablet 325 mg  325 mg Oral QPM Netta Cedars, MD      . ferrous sulfate tablet 325 mg  325 mg Oral TID PC Netta Cedars, MD   325 mg at 07/11/16 0933  . finasteride (PROSCAR) tablet 5 mg  5 mg Oral QPM Netta Cedars, MD   5 mg at 07/10/16 1736  . furosemide (LASIX) tablet 40 mg  40 mg Oral Daily PRN Netta Cedars, MD      . gabapentin (NEURONTIN) capsule 300 mg  300 mg Oral BID Netta Cedars, MD   300 mg at 07/11/16 0934  . HYDROmorphone (DILAUDID) injection 0.5-1 mg  0.5-1 mg Intravenous Q2H PRN Netta Cedars, MD   1 mg at 07/10/16 1738  . loratadine (CLARITIN) tablet 10 mg  10 mg Oral Daily Netta Cedars, MD   10 mg at 07/11/16 0933  . menthol-cetylpyridinium (CEPACOL) lozenge 3 mg  1 lozenge Oral PRN Netta Cedars, MD       Or  .  phenol (CHLORASEPTIC) mouth spray 1 spray  1 spray Mouth/Throat PRN Netta Cedars, MD      . methocarbamol (ROBAXIN) tablet 500 mg  500 mg Oral Q6H PRN Netta Cedars, MD   500 mg at 07/11/16 0932   Or  . methocarbamol (ROBAXIN) 500 mg in dextrose 5 % 50 mL IVPB  500 mg Intravenous Q6H PRN Netta Cedars, MD      . metoCLOPramide (REGLAN) tablet 5-10 mg  5-10 mg Oral Q8H PRN Netta Cedars, MD       Or  . metoCLOPramide (REGLAN) injection 5-10 mg  5-10 mg Intravenous Q8H PRN Netta Cedars, MD      . montelukast (SINGULAIR) tablet 10 mg  10 mg Oral q morning - 10a Netta Cedars, MD   10 mg at 07/11/16 0933  . multivitamin with minerals tablet 1 tablet  1 tablet Oral QHS Netta Cedars, MD   1 tablet at 07/10/16 2135  . ondansetron (ZOFRAN) tablet 4 mg  4 mg Oral Q6H PRN Netta Cedars, MD       Or  . ondansetron [facility]) injection 4 mg  4 mg Intravenous Q6H PRN Netta Cedars, MD   4 mg at 07/10/16 1617  . oxyCODONE (Oxy IR/ROXICODONE) immediate release tablet 5-10 mg  5-10 mg Oral Q3H PRN Netta Cedars, MD   10 mg at 07/11/16 0514  . pantoprazole (PROTONIX) EC tablet 40 mg  40 mg Oral QAC breakfast Netta Cedars, MD   40 mg at 07/11/16 0933  . polyethylene glycol (MIRALAX / GLYCOLAX) packet 17 g  17 g Oral Daily PRN Netta Cedars, MD      . spironolactone (ALDACTONE) tablet 25 mg  25 mg Oral Daily Netta Cedars, MD   25 mg at 07/11/16 0933  . vitamin C (ASCORBIC ACID) tablet 500 mg  500 mg Oral BID Netta Cedars, MD   500 mg at 07/11/16 [id number]  . warfarin (COUMADIN) tablet 7.5 mg  7.5 mg Oral ONCE-1800 Tyrone Apple, RPH      . Warfarin - Pharmacist Dosing Inpatient   Does not apply [place], [facility]         Discharge Medications: Please see discharge summary for a list of discharge medications.  Relevant Imaging Results:  Relevant Lab Results:   Additional Information SSN: [id number]  Alla German, LCSW

## 2016-07-11 NOTE — Progress Notes (Signed)
OT Cancellation Note  Patient Details Name: Scott [name] MRN: [id number] DOB: 08/28/1935   Cancelled Treatment:    Reason Eval/Treat Not Completed: Fatigue/lethargy limiting ability to participate;Other (comment) (just got into CPM). Pt just finished working with PT; reports fatigue and just got in CPM. Requesting OT check back tomorrow. Will follow up as time allows.  Binnie Kand M.S., OTR/L Pager: [phone]  07/11/2016, 4:31 PM

## 2016-07-11 NOTE — Clinical Social Work Note (Signed)
Clinical Social Work Assessment  Patient Details  Name: Scott [name] MRN: [id number] Date of Birth: [birth year]  Date of referral:  07/11/16               Reason for consult:  Facility Placement                Permission sought to share information with:  Family Supports Permission granted to share information::  Yes, Verbal Permission Granted  Name::     Lelan Pons  Agency::     Relationship::  Spouse  Contact Information:  [phone]  Housing/Transportation Living arrangements for the past 2 months:  Single Family Home Source of Information:  Patient Patient Interpreter Needed:  None Criminal Activity/Legal Involvement Pertinent to Current Situation/Hospitalization:  No - Comment as needed Significant Relationships:  Adult Children, Spouse Lives with:  Spouse Do you feel safe going back to the place where you live?  Yes Need for family participation in patient care:  Yes (Comment)  Care giving concerns:  Pt's wife and pt's son were present at bedside. Pt's family denied any concerns at this time.   Social Worker assessment / plan:  CSW spoke with pt at bedside. Pt's spouse and son were present at bedside. Pt lives at home with wife and she is able to assist in his care following rehab. Pt is agreeable to SNF placement and in fact had already set up rehab with [facility] previous to scheduled surgery. Pt is stating that if is ok by the MD he would prefer to ride home with his wife versus PTAR. CSW explained she would ask the MD and see if that is ok and if not CSW will set up PTAR.  Employment status:  Retired Nurse, adult PT Recommendations:  Lupus / Referral to community resources:  [place]  Patient/Family's Response to care:  Pt verbalized understanding of CSW role and showed appreciation for support. Pt and pt family denied any concerns regarding pt care.  Patient/Family's Understanding of and Emotional  Response to Diagnosis, Current Treatment, and Prognosis:  Pt was realistic regarding physical limitations and verbalized understanding of treatment plan. Pt denies any questions or concerns regarding treatment plan at this time.  Emotional Assessment Appearance:  Appears stated age Attitude/Demeanor/Rapport:   (Patient was appropriate.) Affect (typically observed):  Accepting, Appropriate, Pleasant Orientation:  Oriented to Self, Oriented to Place, Oriented to  Time, Oriented to Situation Alcohol / Substance use:  Not Applicable Psych involvement (Current and /or in the community):  No (Comment)  Discharge Needs  Concerns to be addressed:  No discharge needs identified Readmission within the last 30 days:  No Current discharge risk:  Dependent with Mobility Barriers to Discharge:  Continued Medical Work up   [employer], LCSW 07/11/2016, 1:07 PM

## 2016-07-11 NOTE — Clinical Social Work Placement (Signed)
   CLINICAL SOCIAL WORK PLACEMENT  NOTE  Date:  07/11/2016  Patient Details  Name: KAMRY STIKA MRN: [id number] Date of Birth: [birth year]  Clinical Social Work is seeking post-discharge placement for this patient at the [place] level of care (*CSW will initial, date and re-position this form in  chart as items are completed):      Patient/family provided with [place] Work Department's list of facilities offering this level of care within the geographic area requested by the patient (or if unable, by the patient's family).      Patient/family informed of their freedom to choose among providers that offer the needed level of care, that participate in Medicare, Medicaid or managed care program needed by the patient, have an available bed and are willing to accept the patient.      Patient/family informed of [facility]'s ownership interest in [facility] and [facility], as well as of the fact that they are under no obligation to receive care at these facilities.  PASRR submitted to EDS on       PASRR number received on 07/11/16     Existing PASRR number confirmed on       FL2 transmitted to all facilities in geographic area requested by pt/family on 07/11/16     FL2 transmitted to all facilities within larger geographic area on       Patient informed that his/her managed care company has contracts with or will negotiate with certain facilities, including the following:            Patient/family informed of bed offers received.  Patient chooses bed at [place], [place]     Physician recommends and patient chooses bed at      Patient to be transferred to [place] on 07/13/16.  Patient to be transferred to facility by       Patient family notified on   of transfer.  Name of family member notified:        PHYSICIAN Please prepare priority discharge summary, including medications, Please prepare prescriptions,  Please sign FL2     Additional Comment:    _______________________________________________ Alla German, LCSW 07/11/2016, 1:23 PM

## 2016-07-12 ENCOUNTER — Inpatient Hospital Stay (HOSPITAL_COMMUNITY): Payer: Medicare Other

## 2016-07-12 LAB — CBC
HEMATOCRIT: 36.1 % — AB (ref 39.0–52.0)
Hemoglobin: 11.9 g/dL — ABNORMAL LOW (ref 13.0–17.0)
MCH: 30 pg (ref 26.0–34.0)
MCHC: 33 g/dL (ref 30.0–36.0)
MCV: 90.9 fL (ref 78.0–100.0)
PLATELETS: 170 10*3/uL (ref 150–400)
RBC: 3.97 MIL/uL — AB (ref 4.22–5.81)
RDW: 13.5 % (ref 11.5–15.5)
WBC: 13 10*3/uL — ABNORMAL HIGH (ref 4.0–10.5)

## 2016-07-12 LAB — PROTIME-INR
INR: 1.46
Prothrombin Time: 17.9 seconds — ABNORMAL HIGH (ref 11.4–15.2)

## 2016-07-12 MED ORDER — WARFARIN SODIUM 7.5 MG PO TABS
7.5000 mg | ORAL_TABLET | Freq: Once | ORAL | Status: AC
  Administered 2016-07-12: 7.5 mg via ORAL
  Filled 2016-07-12: qty 1

## 2016-07-12 MED ORDER — GUAIFENESIN ER 600 MG PO TB12
600.0000 mg | ORAL_TABLET | Freq: Four times a day (QID) | ORAL | Status: DC | PRN
  Administered 2016-07-12: 600 mg via ORAL
  Filled 2016-07-12: qty 1

## 2016-07-12 NOTE — Progress Notes (Signed)
Physical Therapy Treatment Patient Details Name: Scott [name] MRN: [id number] DOB: 1936/01/25 Today's Date: 07/12/2016    History of Present Illness 80 yo male admitted on 07/10/16 for Left TKA. Pt is electing to go to SNF upon DC for short term rehab. PMH significant for CAD with stent placement '07, HTN, MI, Prostate CA.    PT Comments    Pt presents POD 2 and continues to be limited with gait and transfers. Pt has decreased weightbearing through LLE during stance which limits his ability to advance RLE forward during swing phase. Lowered RW to assist with UE use during gait, but pt continues to be limited. After discussion with his wife and consulting with OT we are in agreement that SNF is the best option for this pt at this time in order to ensure safety and give the pt the maximum benefit from his rehab. Pt is not mobile enough to return home safely with his wife who is also suffering from chronic illness. Pt is making slow progress and is not ready to return home safely at this time.    Follow Up Recommendations  SNF     Equipment Recommendations  None recommended by PT;Other (comment)    Recommendations for Other Services       Precautions / Restrictions Precautions Precautions: Knee Precaution Booklet Issued: No Required Braces or Orthoses: Knee Immobilizer - Left Knee Immobilizer - Left: On when out of bed or walking Restrictions Weight Bearing Restrictions: Yes LLE Weight Bearing: Weight bearing as tolerated    Mobility  Bed Mobility Overal bed mobility: Needs Assistance Bed Mobility: Sit to Supine       Sit to supine: Min guard   General bed mobility comments: Min guard to bring LLE into bed  Transfers Overall transfer level: Needs assistance Equipment used: Rolling walker (2 wheeled) Transfers: Sit to/from Stand Sit to Stand: Min assist         General transfer comment: min a from EOB to RW   Ambulation/Gait Ambulation/Gait assistance: Min  guard Ambulation Distance (Feet): 25 Feet Assistive device: Rolling walker (2 wheeled) Gait Pattern/deviations: Step-to pattern;Decreased step length - right;Decreased stance time - left;Antalgic Gait velocity: decreased Gait velocity interpretation: Below normal speed for age/gender General Gait Details: moderate antalgic gait with poor advancement of RLE forward during swing phase due to pt limiting weight bearing through LLE. Cues given for proper sequencing and pt able to maintain for 10' before returning to previous sequence with cueing again   Stairs            Wheelchair Mobility    Modified Rankin (Stroke Patients Only)       Balance Overall balance assessment: Needs assistance Sitting-balance support: Feet supported;No upper extremity supported Sitting balance-Leahy Scale: Good     Standing balance support: Bilateral upper extremity supported Standing balance-Leahy Scale: Poor Standing balance comment: relies on UE's on RW for stability. Lowered RW to assist with offloading LLE during stance                    Cognition Arousal/Alertness: Awake/alert Behavior During Therapy: WFL for tasks assessed/performed Overall Cognitive Status: Within Functional Limits for tasks assessed                      Exercises Total Joint Exercises Hip ABduction/ADduction: AROM;Left;10 reps;Supine Straight Leg Raises: AROM;Left;10 reps;Supine Goniometric ROM: 4-85    General Comments        Pertinent Vitals/Pain Pain Assessment: Faces Pain  Score: 5  Faces Pain Scale: Hurts even more Pain Location: L knee Pain Descriptors / Indicators: Aching Pain Intervention(s): Monitored during session;Premedicated before session;Ice applied;Repositioned    Home Living Family/patient expects to be discharged to:: Private residence Living Arrangements: Spouse/significant other Available Help at Discharge: Family;Available 24 hours/day Type of Home: House Home Access:  Stairs to enter   Home Layout: One level Home Equipment: Shower seat;Grab bars - tub/shower      Prior Function Level of Independence: Independent      Comments: was driving, doing yard work, running errands   PT Goals (current goals can now be found in the care plan section) Acute Rehab PT Goals Patient Stated Goal: rehab then home Progress towards PT goals: Progressing toward goals    Frequency    7X/week      PT Plan Current plan remains appropriate    Co-evaluation             End of Session Equipment Utilized During Treatment: Gait belt Activity Tolerance: Patient tolerated treatment well Patient left: in bed;with family/visitor present;with call bell/phone within reach     Time: 1407-1430 PT Time Calculation (min) (ACUTE ONLY): 23 min  Charges:  $Gait Training: 8-22 mins $Therapeutic Activity: 8-22 mins                    G Codes:      Scheryl Marten PT, DPT  [phone]  07/12/2016, 2:37 PM

## 2016-07-12 NOTE — Progress Notes (Addendum)
Pt called out stating he didn't know where he was and wanted to speak with someone. Used call light system to do so. Pt appeared to be confused and had incontinence of urine. Pt states "I remember you" and was able to answer orientation questions appropriately. Son was at bedside and aware of event. RN finished getting report for this shift change as son sat with patient. Son stated to this RN that pt dozed off a few times and awoke confused x2 in regards to thinking it was Saturday, not Sunday and he had stated he had pain in his leg twice but when son asked him afterward he said "Oh no, I haven't had any pain, I've been feeling great." Pt also has cough episodes. Lung sounds on L dimmer than R but no "wet" sounds audible. Paged on call MD regarding events and req med interventions for cough as well. Pt currently sitting upright in bed. Bed alarm on. 3 side rails up. Call bell in reach.

## 2016-07-12 NOTE — Progress Notes (Signed)
   Subjective:  Patient reports pain as mild to moderate.  Min assist with PT.  Objective:   VITALS:   Vitals:   07/11/16 1209 07/11/16 1446 07/11/16 1957 07/12/16 0620  BP:  (!) 142/68 (!) 148/82 138/65  Pulse:  88 72 91  Resp:  16    Temp: 99.1 F (37.3 C) 99 F (37.2 C) 99 F (37.2 C) 99.6 F (37.6 C)  TempSrc:  Oral Oral Oral  SpO2:  92% 96% 92%  Weight:      Height:        NAD ABD soft Sensation intact distally Intact pulses distally Incision: dressing C/D/I and dressing changed Compartment soft    Lab Results  Component Value Date   WBC 13.0 (H) 07/12/2016   HGB 11.9 (L) 07/12/2016   HCT 36.1 (L) 07/12/2016   MCV 90.9 07/12/2016   PLT 170 07/12/2016   BMET    Component Value Date/Time   NA 136 07/11/2016 0612   NA 143 08/12/2012 1324   K 4.3 07/11/2016 0612   K 4.1 08/12/2012 1324   CL 103 07/11/2016 0612   CL 110 (H) 08/12/2012 1324   CO2 24 07/11/2016 0612   CO2 24 08/12/2012 1324   GLUCOSE 121 (H) 07/11/2016 0612   GLUCOSE 123 (H) 08/12/2012 1324   BUN 23 (H) 07/11/2016 0612   BUN 27.0 (H) 08/12/2012 1324   CREATININE 1.26 (H) 07/11/2016 0612   CREATININE 1.2 08/12/2012 1324   CALCIUM 8.4 (L) 07/11/2016 0612   CALCIUM 9.3 08/12/2012 1324   GFRNONAA 53 (L) 07/11/2016 0612   GFRAA >60 07/11/2016 0612     Assessment/Plan: 2 Days Post-Op   Active Problems:   H/O total knee replacement, left  WBAT with walker DVT ppx: coumadin, SCDs, TEDs PO pain control Dispo: PT currently recommending SNF, probable d/c tomorrow   Ad Guttman, Horald Pollen 07/12/2016, 7:47 AM   Rod Can, MD Cell [phone]

## 2016-07-12 NOTE — Progress Notes (Signed)
Physical Therapy Treatment Patient Details Name: Scott [name] MRN: [id number] DOB: [birth year] Today's Date: 07/12/2016    History of Present Illness 80 yo male admitted on 07/10/16 for Left TKA. Pt is electing to go to SNF upon DC for short term rehab. PMH significant for CAD with stent placement '07, HTN, MI, Prostate CA.    PT Comments    Pt is POD 2 and moving better with therapy. Pt able to perform bed mobs with supervision with improved tolerance for gait. Pt continues to be limited with strength and endurance. Pt is concerned about the possibility of going home with his wife and her ability to assist him. Pt to discuss with wife and follow-up with staff. Pt will benefit from second therapy session this PM to possibly address stairs.    Follow Up Recommendations  SNF;Other (comment);Home health PT (MD would like pt to go Home health, pt to discuss with wife)     Equipment Recommendations  None recommended by PT;Other (comment)    Recommendations for Other Services       Precautions / Restrictions Precautions Precautions: Knee Precaution Booklet Issued: Yes (comment) Precaution Comments: reviewed no pillow under knee Required Braces or Orthoses: Knee Immobilizer - Left Knee Immobilizer - Left: On when out of bed or walking Restrictions Weight Bearing Restrictions: Yes LLE Weight Bearing: Weight bearing as tolerated    Mobility  Bed Mobility Overal bed mobility: Needs Assistance Bed Mobility: Supine to Sit     Supine to sit: Supervision     General bed mobility comments: Supervision with HOB flat and no railing  Transfers Overall transfer level: Needs assistance Equipment used: Rolling walker (2 wheeled) Transfers: Sit to/from Stand Sit to Stand: Min guard         General transfer comment: min guard for safety  Ambulation/Gait Ambulation/Gait assistance: Min guard Ambulation Distance (Feet): 50 Feet Assistive device: Rolling walker (2 wheeled) Gait  Pattern/deviations: Step-to pattern;Decreased step length - right;Decreased stance time - left;Antalgic Gait velocity: decreased Gait velocity interpretation: Below normal speed for age/gender General Gait Details: moderate antalgic gait with KI in place from bed to chair   Stairs            Wheelchair Mobility    Modified Rankin (Stroke Patients Only)       Balance                                    Cognition Arousal/Alertness: Awake/alert Behavior During Therapy: WFL for tasks assessed/performed Overall Cognitive Status: Within Functional Limits for tasks assessed                      Exercises Total Joint Exercises Hip ABduction/ADduction: AROM;Left;10 reps;Supine Straight Leg Raises: AROM;Left;10 reps;Supine    General Comments General comments (skin integrity, edema, etc.): Pt concerned, MD wants him to go home and would like to discuss with his wife before making decision      Pertinent Vitals/Pain Pain Assessment: 0-10 Pain Score: 4  Pain Location: L knee Pain Descriptors / Indicators: Aching Pain Intervention(s): Monitored during session;Ice applied;Repositioned    Home Living                      Prior Function            PT Goals (current goals can now be found in the care plan section) Acute Rehab PT Goals  Patient Stated Goal: rehab and then  home     Frequency    7X/week      PT Plan Current plan remains appropriate    Co-evaluation             End of Session Equipment Utilized During Treatment: Gait belt Activity Tolerance: Patient tolerated treatment well Patient left: in chair;with call bell/phone within reach     Time: 0900-0930 PT Time Calculation (min) (ACUTE ONLY): 30 min  Charges:  $Gait Training: 8-22 mins $Therapeutic Exercise: 8-22 mins                    G Codes:      Scheryl Marten PT, DPT  [phone]  07/12/2016, 9:36 AM

## 2016-07-12 NOTE — Discharge Instructions (Signed)
Ice to the knee as much as possible.  Keep the incision clean and dry and covered for one week after surgery, then ok to get it wet in the shower.  Do exercises every hour while awake.  DO NOT prop under the knee, elevate by placing pillow or blanket under the ankle or calf.  Sleep with the knee immobilizer on to maintain extension.  WBAT on the left leg  Use the CPM for 6 hours per day in two hour blocks of time.  Follow up in two weeks in the office [phone]   ==================   Information on my medicine - Coumadin   (Warfarin)  This medication education was reviewed with me or my healthcare representative as part of my discharge preparation.  The pharmacist that spoke with me during my hospital stay was:  Saundra Shelling, [facility]  Why was Coumadin prescribed for you? Coumadin was prescribed for you because you have a blood clot or a medical condition that can cause an increased risk of forming blood clots. Blood clots can cause serious health problems by blocking the flow of blood to the heart, lung, or brain. Coumadin can prevent harmful blood clots from forming. As a reminder your indication for Coumadin is:   Blood Clot Prevention After Orthopedic Surgery  What test will check on my response to Coumadin? While on Coumadin (warfarin) you will need to have an INR test regularly to ensure that your dose is keeping you in the desired range. The INR (international normalized ratio) number is calculated from the result of the laboratory test called prothrombin time (PT).  If an INR APPOINTMENT HAS NOT ALREADY BEEN MADE FOR YOU please schedule an appointment to have this lab work done by your health care provider within 7 days. Your INR goal is usually a number between:  2 to 3 or your provider may give you a more narrow range like 2-2.5.  Ask your health care provider during an office visit what your goal INR is.  What  do you need to  know  About  COUMADIN? Take Coumadin  (warfarin) exactly as prescribed by your healthcare provider about the same time each day.  DO NOT stop taking without talking to the doctor who prescribed the medication.  Stopping without other blood clot prevention medication to take the place of Coumadin may increase your risk of developing a new clot or stroke.  Get refills before you run out.  What do you do if you miss a dose? If you miss a dose, take it as soon as you remember on the same day then continue your regularly scheduled regimen the next day.  Do not take two doses of Coumadin at the same time.  Important Safety Information A possible side effect of Coumadin (Warfarin) is an increased risk of bleeding. You should call your healthcare provider right away if you experience any of the following: ? Bleeding from an injury or your nose that does not stop. ? Unusual colored urine (red or dark brown) or unusual colored stools (red or black). ? Unusual bruising for unknown reasons. ? A serious fall or if you hit your head (even if there is no bleeding).  Some foods or medicines interact with Coumadin (warfarin) and might alter your response to warfarin. To help avoid this: ? Eat a balanced diet, maintaining a consistent amount of Vitamin K. ? Notify your provider about major diet changes you plan to make. ? Avoid alcohol or limit your intake  to 1 drink for women and 2 drinks for men per day. (1 drink is 5 oz. wine, 12 oz. beer, or 1.5 oz. liquor.)  Make sure that ANY health care provider who prescribes medication for you knows that you are taking Coumadin (warfarin).  Also make sure the healthcare provider who is monitoring your Coumadin knows when you have started a new medication including herbals and non-prescription products.  Coumadin (Warfarin)  Major Drug Interactions  Increased Warfarin Effect Decreased Warfarin Effect  Alcohol (large quantities) Antibiotics (esp. Septra/Bactrim, Flagyl, Cipro) Amiodarone  (Cordarone) Aspirin (ASA) Cimetidine (Tagamet) Megestrol (Megace) NSAIDs (ibuprofen, naproxen, etc.) Piroxicam (Feldene) Propafenone (Rythmol SR) Propranolol (Inderal) Isoniazid (INH) Posaconazole (Noxafil) Barbiturates (Phenobarbital) Carbamazepine (Tegretol) Chlordiazepoxide (Librium) Cholestyramine (Questran) Griseofulvin Oral Contraceptives Rifampin Sucralfate (Carafate) Vitamin K   Coumadin (Warfarin) Major Herbal Interactions  Increased Warfarin Effect Decreased Warfarin Effect  Garlic Ginseng Ginkgo biloba Coenzyme Q10 Green tea St. Johns wort    Coumadin (Warfarin) FOOD Interactions  Eat a consistent number of servings per week of foods HIGH in Vitamin K (1 serving =  cup)  Collards (cooked, or boiled & drained) Kale (cooked, or boiled & drained) Mustard greens (cooked, or boiled & drained) Parsley *serving size only =  cup Spinach (cooked, or boiled & drained) Swiss chard (cooked, or boiled & drained) Turnip greens (cooked, or boiled & drained)  Eat a consistent number of servings per week of foods MEDIUM-HIGH in Vitamin K (1 serving = 1 cup)  Asparagus (cooked, or boiled & drained) Broccoli (cooked, boiled & drained, or raw & chopped) Brussel sprouts (cooked, or boiled & drained) *serving size only =  cup Lettuce, raw (green leaf, endive, romaine) Spinach, raw Turnip greens, raw & chopped   These websites have more information on Coumadin (warfarin):  FailFactory.se; VeganReport.com.au;

## 2016-07-12 NOTE — Evaluation (Signed)
Occupational Therapy Evaluation Patient Details Name: Scott [name] MRN: [id number] DOB: 10/21/1935 Today's Date: 07/12/2016    History of Present Illness 80 yo male admitted on 07/10/16 for Left TKA. Pt is electing to go to SNF upon DC for short term rehab. PMH significant for CAD with stent placement '07, HTN, MI, Prostate CA.   Clinical Impression   Pt reports he was independent with ADL PTA. Currently pt requires min assist for functional mobility, supervision for UB ADL in sitting, and mod-max assist for LB ADL. Pt with difficulty getting up from lower surfaces (chair and toilet) requiring mod assist to boost up. Pt fatigues quickly with functional mobility/activities and limits weight bearing through LLE due to pain. At this time, feel SNF would be a more appropriate d/c venue due to current level of assist needed for BADL. If pt chooses to d/c home he will need HHOT for follow up. Pt would benefit from continued skilled OT to address established goals.    Follow Up Recommendations  SNF;Supervision/Assistance - 24 hour (if pt d/c home will need HHOT)    Equipment Recommendations  3 in 1 bedside comode    Recommendations for Other Services       Precautions / Restrictions Precautions Precautions: Knee Precaution Booklet Issued: No Precaution Comments: reviewed no pillow under knee Required Braces or Orthoses: Knee Immobilizer - Left Knee Immobilizer - Left: On when out of bed or walking Restrictions Weight Bearing Restrictions: Yes LLE Weight Bearing: Weight bearing as tolerated      Mobility Bed Mobility      General bed mobility comments: Pt OOB in chair upon arrival.  Transfers Overall transfer level: Needs assistance Equipment used: Rolling walker (2 wheeled) Transfers: Sit to/from Stand Sit to Stand: Mod assist         General transfer comment: Mod assist to boost up from chair x2, toilet x1.     Balance Overall balance assessment: Needs  assistance Sitting-balance support: Feet supported;No upper extremity supported Sitting balance-Leahy Scale: Good     Standing balance support: Bilateral upper extremity supported;During functional activity Standing balance-Leahy Scale: Poor Standing balance comment: Bil forearms supported on sink during grooming.                            ADL Overall ADL's : Needs assistance/impaired Eating/Feeding: Set up;Sitting   Grooming: Minimal assistance;Standing;Wash/dry hands Grooming Details (indicate cue type and reason): for balance Upper Body Bathing: Set up;Supervision/ safety;Sitting   Lower Body Bathing: Moderate assistance;Sit to/from stand   Upper Body Dressing : Set up;Supervision/safety;Sitting   Lower Body Dressing: Maximal assistance;Sit to/from stand   Toilet Transfer: Minimal assistance;Ambulation;Comfort height toilet;Grab bars;RW Armed forces technical officer Details (indicate cue type and reason): Pt with difficulty boosting up off comfort height toilet. Recommend use of 3 in 1 over toilet for increased independence and safety.         Functional mobility during ADLs: Minimal assistance;Rolling walker General ADL Comments: Discussed SNF vs home with [name] therapy with pt and wife; they are still thinking about which venue they will d/c to.      Vision     Perception     Praxis      Pertinent Vitals/Pain Pain Assessment: 0-10 Pain Score: 5  Pain Location: L knee Pain Descriptors / Indicators: Aching Pain Intervention(s): Monitored during session     Hand Dominance     Extremity/Trunk Assessment Upper Extremity Assessment Upper Extremity Assessment: Overall [facility]  for tasks assessed   Lower Extremity Assessment Lower Extremity Assessment: Defer to PT evaluation   Cervical / Trunk Assessment Cervical / Trunk Assessment: Kyphotic   Communication Communication Communication: No difficulties   Cognition Arousal/Alertness: Awake/alert Behavior During  Therapy: WFL for tasks assessed/performed Overall Cognitive Status: Within Functional Limits for tasks assessed                     General Comments       Exercises Exercises: Total Joint     Shoulder Instructions      Home Living Family/patient expects to be discharged to:: Private residence Living Arrangements: Spouse/significant other Available Help at Discharge: Family;Available 24 hours/day Type of Home: House Home Access: Stairs to enter [employer] of Steps: 3   Home Layout: One level     Bathroom Shower/Tub: Occupational psychologist: Handicapped height     Home Equipment: Shower seat;Grab bars - tub/shower          Prior Functioning/Environment Level of Independence: Independent        Comments: was driving, doing yard work, running errands        OT Problem List: Decreased strength;Decreased activity tolerance;Impaired balance (sitting and/or standing);Decreased knowledge of use of DME or AE;Decreased knowledge of precautions;Pain   OT Treatment/Interventions: Self-care/ADL training;Energy conservation;DME and/or AE instruction;Therapeutic activities;Patient/family education;Balance training    OT Goals(Current goals can be found in the care plan section) Acute Rehab OT Goals Patient Stated Goal: rehab vs home OT Goal Formulation: With patient/family Time For Goal Achievement: 07/26/16 Potential to Achieve Goals: Good ADL Goals Pt Will Perform Lower Body Bathing: with supervision;sit to/from stand Pt Will Perform Lower Body Dressing: with supervision;sit to/from stand Pt Will Transfer to Toilet: with supervision;ambulating;bedside commode (over toilet) Pt Will Perform Toileting - Clothing Manipulation and hygiene: with supervision;sit to/from stand Pt Will Perform Tub/Shower Transfer: Shower transfer;with supervision;ambulating;shower seat;rolling walker  OT Frequency: Min 2X/week   Barriers to D/C:             Co-evaluation              End of Session Equipment Utilized During Treatment: Rolling walker;Left knee immobilizer CPM Left Knee CPM Left Knee: Off  Activity Tolerance: Patient tolerated treatment well Patient left: in chair;with call bell/phone within reach;with family/visitor present   Time: [id number] OT Time Calculation (min): 23 min Charges:  OT General Charges $OT Visit: 1 Procedure OT Evaluation $OT Eval Moderate Complexity: 1 Procedure OT Treatments $Self Care/Home Management : 8-22 mins G-Codes:     Binnie Kand M.S., OTR/L Pager: [phone]  07/12/2016, 10:53 AM

## 2016-07-12 NOTE — Progress Notes (Signed)
ANTICOAGULATION CONSULT NOTE - FOLLOW UP  Pharmacy Consult:  Coumadin Indication:  VTE prophylaxis  Allergies  Allergen Reactions  . Shellfish Allergy Nausea And Vomiting  . Sulfa Antibiotics Other (See Comments)    Feels fatigued and weak when taking sulfa for long durations     Patient Measurements: Height: 5\' 6"  (167.6 cm) Weight: 177 lb (80.3 kg) IBW/kg (Calculated) : 63.8  Vital Signs: Temp: 99.6 F (37.6 C) (12/03 0620) Temp Source: Oral (12/03 0620) BP: 138/65 (12/03 0620) Pulse Rate: 91 (12/03 0620)  Labs:  Recent Labs  07/10/16 1418 07/11/16 0612 07/12/16 0344  HGB 13.5 12.3* 11.9*  HCT 41.2 38.1* 36.1*  PLT 191 185 170  LABPROT  --  14.8 17.9*  INR  --  1.15 1.46  CREATININE 1.18 1.26*  --     Estimated Creatinine Clearance: 48.1 mL/min (by C-G formula based on SCr of 1.26 mg/dL (H)).     Assessment: 25 YOM to start Lovenox and Coumadin for VTE prophylaxis s/p left TKA.  INR remains sub-therapeutic.  Coumadin dose was not charted as given yesterday.  Patient's SCr was trending up, but his CrCL remains acceptable to continue current Lovenox regimen.  No bleeding reported.   Goal of Therapy:  INR 2-3 Monitor platelets by anticoagulation protocol: Yes    Plan:  - Repeat Coumadin 7.5mg  PO today - Lovenox 30mg  SQ Q12H until INR </= 1.8 - Daily PT / INR - BMET in AM   Aracelli Woloszyn D. Mina Marble, PharmD, BCPS Pager:  [phone] 07/12/2016, 7:48 AM

## 2016-07-12 NOTE — Progress Notes (Signed)
Orthopedic Tech Progress Note Patient Details:  [name] [birth year] [id number]  Patient ID: Scott [name], male   DOB: 1936/01/03, 80 y.o.   MRN: [id number] Applied cpm 0-90  Karolee Stamps 07/12/2016, 6:35 AM

## 2016-07-13 ENCOUNTER — Encounter (HOSPITAL_COMMUNITY): Payer: Self-pay | Admitting: Orthopedic Surgery

## 2016-07-13 LAB — BASIC METABOLIC PANEL
ANION GAP: 7 (ref 5–15)
BUN: 24 mg/dL — AB (ref 6–20)
CO2: 23 mmol/L (ref 22–32)
Calcium: 8.2 mg/dL — ABNORMAL LOW (ref 8.9–10.3)
Chloride: 101 mmol/L (ref 101–111)
Creatinine, Ser: 1.31 mg/dL — ABNORMAL HIGH (ref 0.61–1.24)
GFR calc Af Amer: 58 mL/min — ABNORMAL LOW (ref >60)
GFR, EST NON AFRICAN AMERICAN: 50 mL/min — AB (ref >60)
Glucose, Bld: 123 mg/dL — ABNORMAL HIGH (ref 65–99)
Potassium: 4.2 mmol/L (ref 3.5–5.1)
SODIUM: 131 mmol/L — AB (ref 135–145)

## 2016-07-13 LAB — PROTIME-INR
INR: 1.36
Prothrombin Time: 16.8 seconds — ABNORMAL HIGH (ref 11.4–15.2)

## 2016-07-13 LAB — CBC
HEMATOCRIT: 33.4 % — AB (ref 39.0–52.0)
HEMOGLOBIN: 11.1 g/dL — AB (ref 13.0–17.0)
MCH: 29.9 pg (ref 26.0–34.0)
MCHC: 33.2 g/dL (ref 30.0–36.0)
MCV: 90 fL (ref 78.0–100.0)
Platelets: 174 10*3/uL (ref 150–400)
RBC: 3.71 MIL/uL — ABNORMAL LOW (ref 4.22–5.81)
RDW: 13.6 % (ref 11.5–15.5)
WBC: 12.2 10*3/uL — AB (ref 4.0–10.5)

## 2016-07-13 MED ORDER — WARFARIN SODIUM 5 MG PO TABS: 5.0000 mg | ORAL_TABLET | Freq: Once | ORAL | Status: DC

## 2016-07-13 NOTE — Progress Notes (Signed)
Physical Therapy Treatment Patient Details Name: Scott [name] MRN: [id number] DOB: 1935/12/07 Today's Date: 07/13/2016    History of Present Illness 80 yo male admitted on 07/10/16 for Left TKA. Pt is electing to go to SNF upon DC for short term rehab. PMH significant for CAD with stent placement '07, HTN, MI, Prostate CA.    PT Comments    Pt presents POD 3 and is moving much better with therapy. Improved gait distance to 50' this session as compared to previous sessions. Pt has improved cadence once he decreased step length on LLE to improve advancement of RLE during swing.    Follow Up Recommendations  SNF     Equipment Recommendations  None recommended by PT;Other (comment) (defer to next venue of care)    Recommendations for Other Services       Precautions / Restrictions Precautions Precautions: Knee Precaution Booklet Issued: No Precaution Comments: reviewed no pillow under knee Required Braces or Orthoses: Knee Immobilizer - Left Knee Immobilizer - Left: On when out of bed or walking Restrictions Weight Bearing Restrictions: Yes LLE Weight Bearing: Weight bearing as tolerated    Mobility  Bed Mobility Overal bed mobility: Needs Assistance Bed Mobility: Sit to Supine     Supine to sit: Min guard     General bed mobility comments: Min guard to bring LLE into bed  Transfers Overall transfer level: Needs assistance Equipment used: Rolling walker (2 wheeled) Transfers: Sit to/from Stand Sit to Stand: Min assist         General transfer comment: min a from EOB to RW   Ambulation/Gait Ambulation/Gait assistance: Min guard Ambulation Distance (Feet): 50 Feet Assistive device: Rolling walker (2 wheeled) Gait Pattern/deviations: Step-to pattern;Decreased step length - right;Decreased stance time - left;Antalgic Gait velocity: decreased Gait velocity interpretation: Below normal speed for age/gender General Gait Details: Moderate antalgic gait with cues to  decrease step length on LLE in order to improve advancement in RLE to bring bilateral LE's even with gait    Stairs            Wheelchair Mobility    Modified Rankin (Stroke Patients Only)       Balance                                    Cognition Arousal/Alertness: Awake/alert Behavior During Therapy: WFL for tasks assessed/performed Overall Cognitive Status: Within Functional Limits for tasks assessed                      Exercises Total Joint Exercises Ankle Circles/Pumps: AROM;20 reps;Both;Supine    General Comments        Pertinent Vitals/Pain Pain Assessment: 0-10 Pain Score: 4  Pain Location: L knee Pain Descriptors / Indicators: Aching;Guarding Pain Intervention(s): Monitored during session;Premedicated before session;Ice applied    Home Living Family/patient expects to be discharged to:: Inpatient rehab Living Arrangements: Spouse/significant other                  Prior Function            PT Goals (current goals can now be found in the care plan section) Acute Rehab PT Goals Patient Stated Goal: rehab then home Progress towards PT goals: Progressing toward goals    Frequency    7X/week      PT Plan Current plan remains appropriate    Co-evaluation  End of Session Equipment Utilized During Treatment: Gait belt Activity Tolerance: Patient tolerated treatment well Patient left: in chair;with call bell/phone within reach;with family/visitor present     Time: 1035-1101 PT Time Calculation (min) (ACUTE ONLY): 26 min  Charges:  $Gait Training: 23-37 mins                    G Codes:      Scheryl Marten PT, DPT  [phone]  07/13/2016, 12:47 PM

## 2016-07-13 NOTE — Clinical Social Work Note (Signed)
CSW spoke with PA and pt can be transferred by his Wife to [facility]. CSW will contact pt wife. [facility] is ready for pt at this time.  [address] [place]

## 2016-07-13 NOTE — Progress Notes (Signed)
ANTICOAGULATION CONSULT NOTE - FOLLOW UP  Pharmacy Consult:  Coumadin Indication:  VTE prophylaxis  Allergies  Allergen Reactions  . Shellfish Allergy Nausea And Vomiting  . Sulfa Antibiotics Other (See Comments)    Feels fatigued and weak when taking sulfa for long durations     Patient Measurements: Height: 5\' 6"  (167.6 cm) Weight: 177 lb (80.3 kg) IBW/kg (Calculated) : 63.8  Vital Signs: Temp: 99 F (37.2 C) (12/04 0515) Temp Source: Oral (12/04 0515) BP: 108/52 (12/04 0515) Pulse Rate: 69 (12/04 0515)  Labs:  Recent Labs  07/10/16 1418 07/11/16 0612 07/12/16 0344 07/13/16 0313  HGB 13.5 12.3* 11.9* 11.1*  HCT 41.2 38.1* 36.1* 33.4*  PLT 191 185 170 174  LABPROT  --  14.8 17.9* 16.8*  INR  --  1.15 1.46 1.36  CREATININE 1.18 1.26*  --  1.31*    Estimated Creatinine Clearance: 46.3 mL/min (by C-G formula based on SCr of 1.31 mg/dL (H)).   Assessment: 54 YOM to start Lovenox and Coumadin for VTE prophylaxis s/p left TKA.  INR remains sub-therapeutic and decreased- appears dose on 12/2 may have been missed as not charted given. Dose given 12/3.   Patient's SCr is trending up, but his CrCL remains acceptable to continue current Lovenox regimen.  No bleeding reported.   Goal of Therapy:  INR 2-3 Monitor platelets by anticoagulation protocol: Yes    Plan:  - Coumadin 5 mg PO today - Lovenox 30mg  SQ Q12H until INR </= 1.8 - Daily PT / INR - BMET in AM   Sloan Leiter, PharmD, BCPS Clinical Pharmacist [phone] until 3:30 PM [phone] after hours 07/13/2016, 8:16 AM

## 2016-07-13 NOTE — Addendum Note (Signed)
Addendum  created 07/13/16 1519 by Montez Hageman, MD   Anesthesia Intra Blocks edited, Sign clinical note

## 2016-07-13 NOTE — Discharge Summary (Signed)
Physician Discharge Summary   Patient ID: Scott [name] MRN: [id number] DOB/AGE: 1935-11-24 80 y.o.  Admit date: 07/10/2016 Discharge date: 07/13/2016  Admission Diagnoses:  Active Problems:   H/O total knee replacement, left   Discharge Diagnoses:  Same   Surgeries: Procedure(s): TOTAL KNEE ARTHROPLASTY on 07/10/2016   Consultants: PT/Ot  Discharged Condition: Stable  Hospital Course: Scott [name] is an 80 y.o. male who was admitted 07/10/2016 with a chief complaint of left knee pain, and found to have a diagnosis of left knee end stage osteoarthritis.  They were brought to the operating room on 07/10/2016 and underwent the above named procedures.    The patient had an uncomplicated hospital course and was stable for discharge.  Recent vital signs:  Vitals:   07/12/16 2053 07/13/16 0515  BP: 114/60 (!) 108/52  Pulse: 95 69  Resp:    Temp: 99.6 F (37.6 C) 99 F (37.2 C)    Recent laboratory studies:  Results for orders placed or performed during the hospital encounter of 07/10/16  CBC  Result Value Ref Range   WBC 14.0 (H) 4.0 - 10.5 K/uL   RBC 4.45 4.22 - 5.81 MIL/uL   Hemoglobin 13.5 13.0 - 17.0 g/dL   HCT 41.2 39.0 - 52.0 %   MCV 92.6 78.0 - 100.0 fL   MCH 30.3 26.0 - 34.0 pg   MCHC 32.8 30.0 - 36.0 g/dL   RDW 13.8 11.5 - 15.5 %   Platelets 191 150 - 400 K/uL  Creatinine, serum  Result Value Ref Range   Creatinine, Ser 1.18 0.61 - 1.24 mg/dL   GFR calc non Af Amer 57 (L) >60 mL/min   GFR calc Af Amer >60 >60 mL/min  CBC  Result Value Ref Range   WBC 13.2 (H) 4.0 - 10.5 K/uL   RBC 4.16 (L) 4.22 - 5.81 MIL/uL   Hemoglobin 12.3 (L) 13.0 - 17.0 g/dL   HCT 38.1 (L) 39.0 - 52.0 %   MCV 91.6 78.0 - 100.0 fL   MCH 29.6 26.0 - 34.0 pg   MCHC 32.3 30.0 - 36.0 g/dL   RDW 13.6 11.5 - 15.5 %   Platelets 185 150 - 400 K/uL  Basic metabolic panel  Result Value Ref Range   Sodium 136 135 - 145 mmol/L   Potassium 4.3 3.5 - 5.1 mmol/L   Chloride 103 101 - 111 mmol/L    CO2 24 22 - 32 mmol/L   Glucose, Bld 121 (H) 65 - 99 mg/dL   BUN 23 (H) 6 - 20 mg/dL   Creatinine, Ser 1.26 (H) 0.61 - 1.24 mg/dL   Calcium 8.4 (L) 8.9 - 10.3 mg/dL   GFR calc non Af Amer 53 (L) >60 mL/min   GFR calc Af Amer >60 >60 mL/min   Anion gap 9 5 - 15  Protime-INR  Result Value Ref Range   Prothrombin Time 14.8 11.4 - 15.2 seconds   INR 1.15   CBC  Result Value Ref Range   WBC 13.0 (H) 4.0 - 10.5 K/uL   RBC 3.97 (L) 4.22 - 5.81 MIL/uL   Hemoglobin 11.9 (L) 13.0 - 17.0 g/dL   HCT 36.1 (L) 39.0 - 52.0 %   MCV 90.9 78.0 - 100.0 fL   MCH 30.0 26.0 - 34.0 pg   MCHC 33.0 30.0 - 36.0 g/dL   RDW 13.5 11.5 - 15.5 %   Platelets 170 150 - 400 K/uL  Protime-INR  Result Value Ref Range  Prothrombin Time 17.9 (H) 11.4 - 15.2 seconds   INR 1.46   CBC  Result Value Ref Range   WBC 12.2 (H) 4.0 - 10.5 K/uL   RBC 3.71 (L) 4.22 - 5.81 MIL/uL   Hemoglobin 11.1 (L) 13.0 - 17.0 g/dL   HCT 33.4 (L) 39.0 - 52.0 %   MCV 90.0 78.0 - 100.0 fL   MCH 29.9 26.0 - 34.0 pg   MCHC 33.2 30.0 - 36.0 g/dL   RDW 13.6 11.5 - 15.5 %   Platelets 174 150 - 400 K/uL  Protime-INR  Result Value Ref Range   Prothrombin Time 16.8 (H) 11.4 - 15.2 seconds   INR [id number]   Basic metabolic panel  Result Value Ref Range   Sodium 131 (L) 135 - 145 mmol/L   Potassium 4.2 3.5 - 5.1 mmol/L   Chloride 101 101 - 111 mmol/L   CO2 23 22 - 32 mmol/L   Glucose, Bld 123 (H) 65 - 99 mg/dL   BUN 24 (H) 6 - 20 mg/dL   Creatinine, Ser 1.31 (H) 0.61 - 1.24 mg/dL   Calcium 8.2 (L) 8.9 - 10.3 mg/dL   GFR calc non Af Amer 50 (L) >60 mL/min   GFR calc Af Amer 58 (L) >60 mL/min   Anion gap 7 5 - 15    Discharge Medications:     Medication List    STOP taking these medications   amoxicillin-clavulanate 875-125 MG tablet Commonly known as:  AUGMENTIN   clopidogrel 75 MG tablet Commonly known as:  PLAVIX     TAKE these medications   acetaminophen 650 MG CR tablet Commonly known as:  TYLENOL Take 1,300 mg by  mouth daily as needed for pain.   acetaminophen 500 MG tablet Commonly known as:  TYLENOL Take 1,000 mg by mouth every 6 (six) hours as needed.   alfuzosin 10 MG 24 hr tablet Commonly known as:  UROXATRAL Take 10 mg by mouth at bedtime.   aspirin 81 MG tablet Take 81 mg by mouth at bedtime.   atorvastatin 40 MG tablet Commonly known as:  LIPITOR Take 40 mg by mouth every evening.   cetirizine 10 MG tablet Commonly known as:  ZYRTEC Take 10 mg by mouth daily before breakfast.   eplerenone 25 MG tablet Commonly known as:  INSPRA Take 25 mg by mouth daily before breakfast.   ferrous sulfate 325 (65 FE) MG EC tablet Take 325 mg by mouth every evening.   finasteride 5 MG tablet Commonly known as:  PROSCAR Take 5 mg by mouth every evening.   furosemide 40 MG tablet Commonly known as:  LASIX Take 40 mg by mouth daily as needed for edema.   gabapentin 300 MG capsule Commonly known as:  NEURONTIN Take 300 mg by mouth 2 (two) times daily.   methocarbamol 500 MG tablet Commonly known as:  ROBAXIN Take 1 tablet (500 mg total) by mouth 3 (three) times daily as needed.   montelukast 10 MG tablet Commonly known as:  SINGULAIR Take 10 mg by mouth every morning.   multivitamin tablet Take 1 tablet by mouth at bedtime.   oxyCODONE-acetaminophen 5-325 MG tablet Commonly known as:  ROXICET Take 1-2 tablets by mouth every 4 (four) hours as needed for severe pain.   pantoprazole 20 MG tablet Commonly known as:  PROTONIX Take 40 mg by mouth daily before breakfast.   sildenafil 100 MG tablet Commonly known as:  VIAGRA Take 100 mg by mouth daily as  needed for erectile dysfunction.   sildenafil 20 MG tablet Commonly known as:  REVATIO Take 20-40 mg by mouth daily as needed (ED).   vitamin C 500 MG tablet Commonly known as:  ASCORBIC ACID Take 500 mg by mouth 2 (two) times daily.   warfarin 5 MG tablet Commonly known as:  COUMADIN Take 1 tablet (5 mg total) by mouth  daily. Take as directed per the pharmacist for INR target from 2.5-3.0 for 30 days post op       Diagnostic Studies: Dg Chest 2 View  Result Date: 07/13/2016 CLINICAL DATA:  Acute onset of worsening cough since recent knee surgery. Initial encounter. EXAM: CHEST  2 VIEW COMPARISON:  CT of the chest performed 01/30/2016 FINDINGS: The lungs are mildly hypoexpanded. A small left pleural effusion is suggested, with left basilar atelectasis. There is no evidence of pneumothorax. The heart is mildly enlarged. No acute osseous abnormalities are seen. IMPRESSION: Lungs mildly hypoexpanded. Mild cardiomegaly. Suggestion of small left pleural effusion, with left basilar atelectasis. Electronically Signed   By: Garald Balding M.D.   On: 07/13/2016 01:19   Dg Knee Left Port  Result Date: 07/10/2016 CLINICAL DATA:  Status post left knee replacement EXAM: PORTABLE LEFT KNEE - 1-2 VIEW COMPARISON:  None. FINDINGS: Total knee arthroplasty without periprosthetic fracture or subluxation. Expected soft tissue gas and swelling. Osteopenia and atherosclerosis. IMPRESSION: Total knee arthroplasty without unexpected finding. Electronically Signed   By: Monte Fantasia M.D.   On: 07/10/2016 10:34    Disposition: 01-Home or Self Care  Discharge Instructions    Call MD / Call 911    Complete by:  As directed    If you experience chest pain or shortness of breath, CALL 911 and be transported to the hospital emergency room.  If you develope a fever above 101 F, pus (white drainage) or increased drainage or redness at the wound, or calf pain, call your surgeon's office.   Constipation Prevention    Complete by:  As directed    Drink plenty of fluids.  Prune juice may be helpful.  You may use a stool softener, such as Colace (over the counter) 100 mg twice a day.  Use MiraLax (over the counter) for constipation as needed.   Diet - low sodium heart healthy    Complete by:  As directed    Increase activity slowly as  tolerated    Complete by:  As directed       Follow-up Information    NORRIS,STEVEN R, MD. Call in 2 weeks.   Specialty:  Orthopedic Surgery Why:  [id number] Contact information: [address] [place] [address] [id number]            Signed: Ventura Bruns 07/13/2016, 9:29 AM

## 2016-07-13 NOTE — Progress Notes (Signed)
IV Dc'd verified DC plan with Cm.  Educated patient and family on instructions.  No questions or concerns.  Will continue to monitor for any changes.

## 2016-07-13 NOTE — Progress Notes (Signed)
Orthopedic Tech Progress Note Patient Details:  Scott [name] 12/05/1935 [id number]  Patient ID: Scott [name], male   DOB: 05/16/1936, 80 y.o.   MRN: [id number] Applied cpm 0-90  Karolee Stamps 07/13/2016, 6:31 AM

## 2016-07-13 NOTE — Progress Notes (Signed)
   Subjective: 3 Days Post-Op Procedure(s) (LRB): TOTAL KNEE ARTHROPLASTY (Left)  Pt c/o dry cough Had x-ray last night showing mild atelectasis and possible small effusion Working with therapy exercises  Patient reports pain as moderate.  Objective:   VITALS:   Vitals:   07/12/16 2053 07/13/16 0515  BP: 114/60 (!) 108/52  Pulse: 95 69  Resp:    Temp: 99.6 F (37.6 C) 99 F (37.2 C)    Left knee incision healing well nv intact distally No rashes or edema  LABS  Recent Labs  07/11/16 0612 07/12/16 0344 07/13/16 0313  HGB 12.3* 11.9* 11.1*  HCT 38.1* 36.1* 33.4*  WBC 13.2* 13.0* 12.2*  PLT 185 170 174     Recent Labs  07/10/16 1418 07/11/16 0612 07/13/16 0313  NA  --  136 131*  K  --  4.3 4.2  BUN  --  23* 24*  CREATININE 1.18 1.26* 1.31*  GLUCOSE  --  121* 123*     Assessment/Plan: 3 Days Post-Op Procedure(s) (LRB): TOTAL KNEE ARTHROPLASTY (Left) D/c to rehab today Will monitor his cough, recommend continued pulmonary toilet F/u in 2 weeks Continue with rehab exercises    Merla Riches, MPAS, PA-C  07/13/2016, 9:27 AM

## 2016-09-04 NOTE — Addendum Note (Signed)
Addendum  created 09/04/16 1406 by Montez Hageman, MD   Anesthesia Intra Blocks edited, Sign clinical note

## 2017-02-03 ENCOUNTER — Encounter: Payer: Self-pay | Admitting: Gastroenterology

## 2018-02-01 IMAGING — CR DG CHEST 2V
2 series · 2 of 2 positions shown · non-contrast
Comparison: CT of the chest performed 01/30/2016

CLINICAL DATA: Acute onset of worsening cough since recent knee
surgery. Initial encounter.

EXAM:
CHEST  2 VIEW

[chest lat]
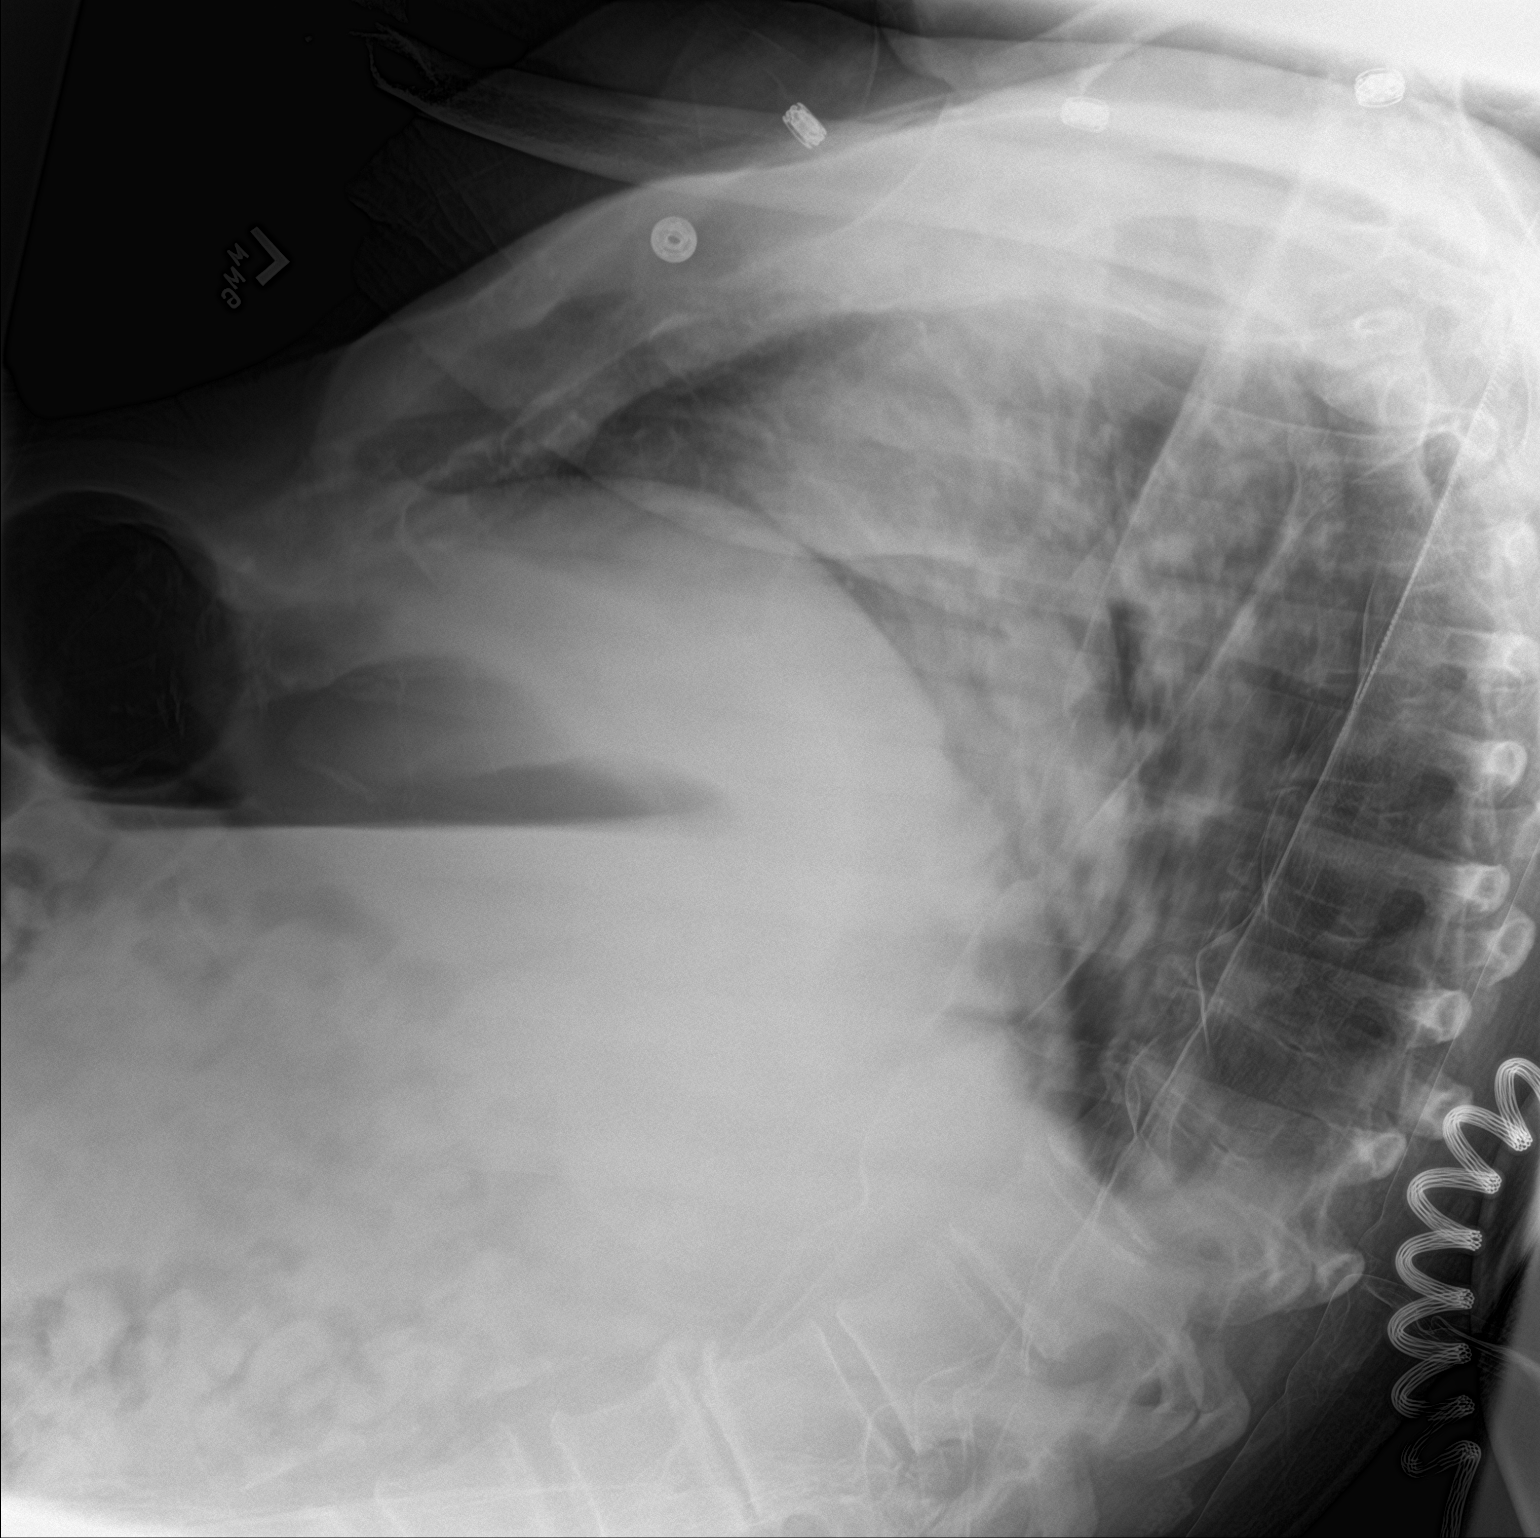

[chest ap]
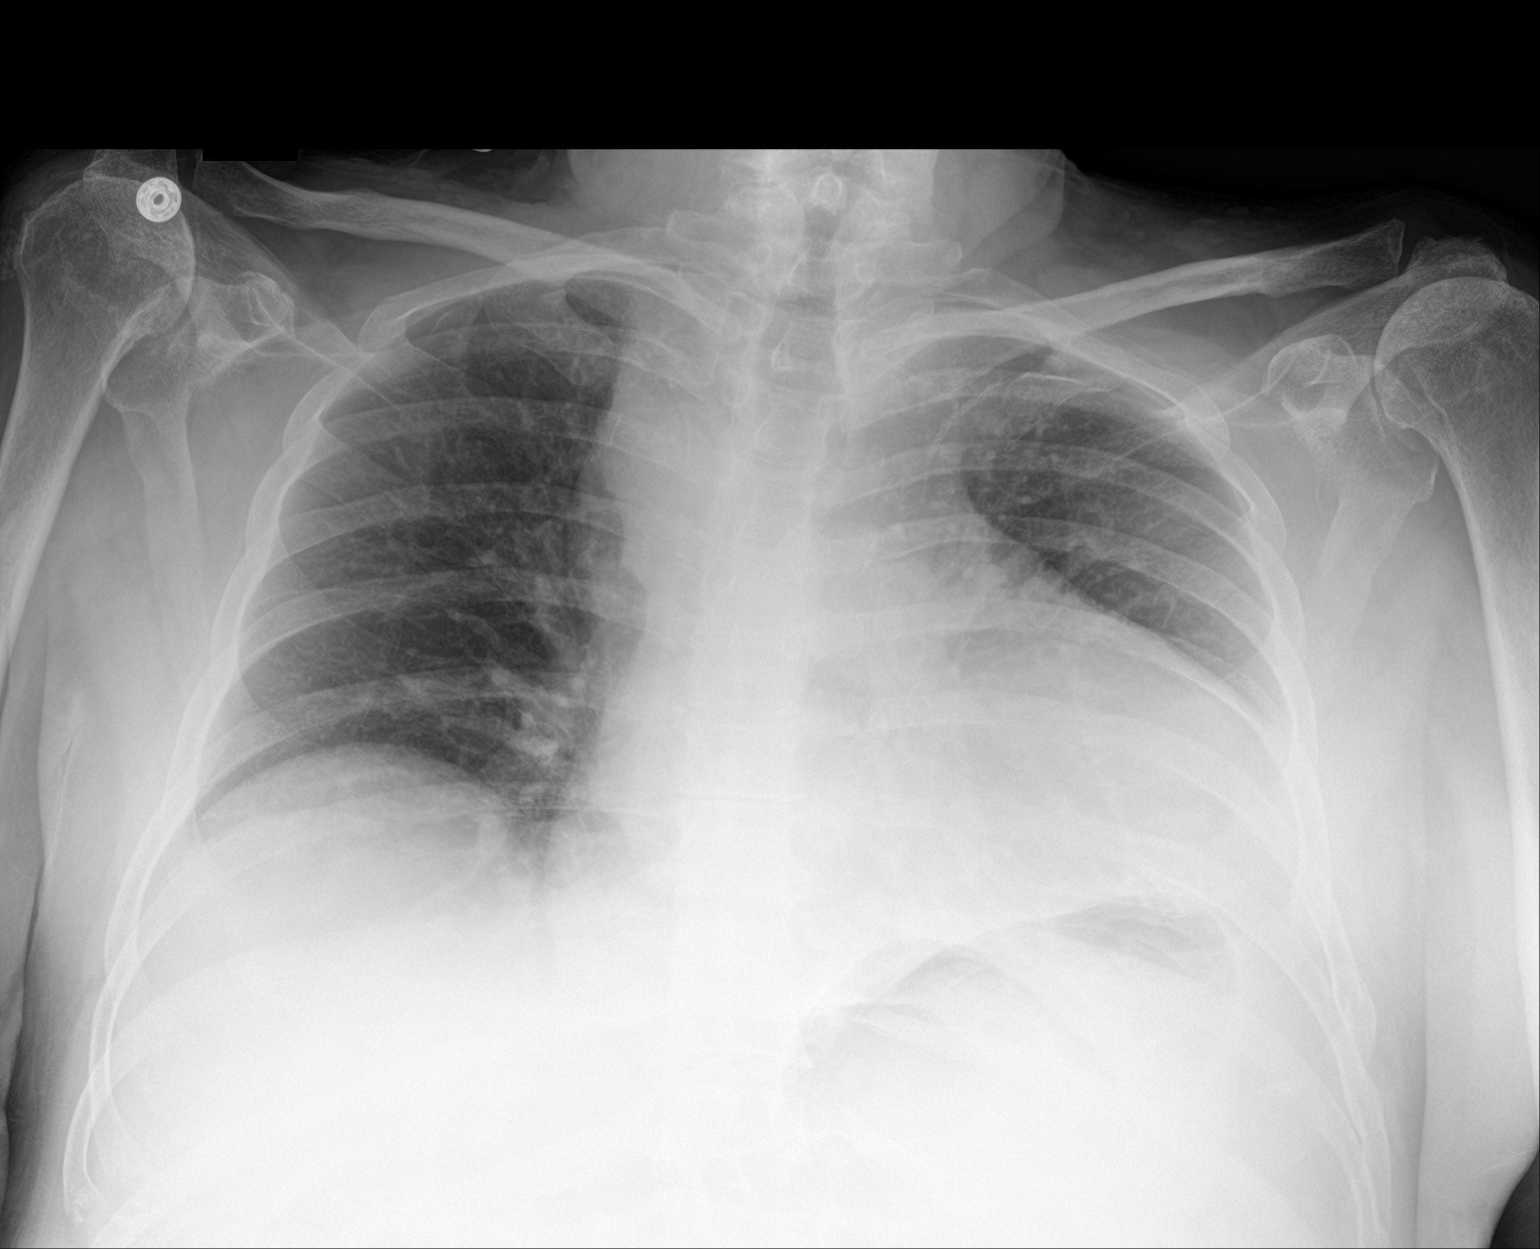

[2 of 2 positions shown; findings below may reference images not displayed]

FINDINGS: The lungs are mildly hypoexpanded. A small left pleural effusion is
suggested, with left basilar atelectasis. There is no evidence of
pneumothorax.

The heart is mildly enlarged. No acute osseous abnormalities are
seen.
IMPRESSION: Lungs mildly hypoexpanded. Mild cardiomegaly. Suggestion of small
left pleural effusion, with left basilar atelectasis.

## 2018-12-30 ENCOUNTER — Other Ambulatory Visit: Payer: Self-pay | Admitting: Registered Nurse

## 2019-01-28 ENCOUNTER — Other Ambulatory Visit: Payer: Self-pay | Admitting: Cardiology

## 2019-05-24 ENCOUNTER — Encounter: Payer: Self-pay | Admitting: Cardiology

## 2019-05-24 ENCOUNTER — Other Ambulatory Visit: Payer: Self-pay

## 2019-05-24 ENCOUNTER — Ambulatory Visit (INDEPENDENT_AMBULATORY_CARE_PROVIDER_SITE_OTHER): Payer: Medicare Other | Admitting: Cardiology

## 2019-05-24 VITALS — BP 152/80 | HR 60 | Temp 96.6°F | Ht 66.0 in | Wt 171.8 lb

## 2019-05-24 DIAGNOSIS — I2510 Atherosclerotic heart disease of native coronary artery without angina pectoris: Secondary | ICD-10-CM | POA: Diagnosis not present

## 2019-05-24 DIAGNOSIS — I482 Chronic atrial fibrillation, unspecified: Secondary | ICD-10-CM

## 2019-05-24 DIAGNOSIS — I10 Essential (primary) hypertension: Secondary | ICD-10-CM

## 2019-05-24 DIAGNOSIS — R0600 Dyspnea, unspecified: Secondary | ICD-10-CM

## 2019-05-24 DIAGNOSIS — R0609 Other forms of dyspnea: Secondary | ICD-10-CM

## 2019-05-24 NOTE — Patient Instructions (Signed)
He has been scheduled for a stress test and an echocardiogram to evaluate her shortness of breath.  Unless these are abnormal, we will see her back in the office in one year.  Please call if needed.

## 2019-05-24 NOTE — Progress Notes (Signed)
Primary Physician/Referring:  Jani Gravel, MD  Patient ID: Scott [name], male    DOB: 09/15/1935, 83 y.o.   MRN: [id number]  Chief Complaint  Patient presents with  . Coronary Artery Disease  . Hypertension  . Atrial Fibrillation  . Follow-up    1 yr shortness of breath   HPI:    Scott [name]  is a 83 y.o. coronary artery disease and h/o PTCA Mid RCA 3.5x20 mm Taxus stent (DES) in 2007 while he was in [place] visiting and had developed myocardial infarction and underwent cardiac catheterization and angioplasty.  He is here on annual follow up. He is doing well but continue to have dyspnea on exertion and has progressed since last year and has to stop frequently during activity. No chest pain.   His cardiac risk factors includes hypertension, hyperlipidemia. He has obstructive sleep apnea and uses CPAP on a regular basis.     Past Medical History:  Diagnosis Date  . Acid reflux    history of Barretts esophagus   . Adenomatous colon polyp 01/2002  . Arthritis 2004  . Coronary artery disease 2007   cardiac cath w/ angioplasty & stent placement x 1  . Enlarged prostate   . Hyperlipidemia   . Hypertension   . Myocardial infarction [facility]) 2007   while vacationing in [place], cath. done there & x1 stent placed   . Neuromuscular disorder ([place])    carpal tunnel - both hands   . Prostate cancer [facility]) 2013   biopsy- active survelliance, annual biopsies   . Sepsis secondary to UTI ([place])   . Shortness of breath dyspnea   . Sleep apnea 2001  . Venous insufficiency    Past Surgical History:  Procedure Laterality Date  . ANGIOPLASTY  09/18/2005   1 stent placed  . APPENDECTOMY  1989  . PROSTATE BIOPSY  12/30/2000  . ROTATOR CUFF REPAIR  2004   right  . TONSILLECTOMY AND ADENOIDECTOMY  1950  . TOTAL KNEE ARTHROPLASTY Left 07/10/2016   Procedure: TOTAL KNEE ARTHROPLASTY;  Surgeon: Netta Cedars, MD;  Location: [place];  Service: Orthopedics;  Laterality: Left;  Marland Kitchen VASECTOMY  1970   Social  History   Socioeconomic History  . Marital status: Married    Spouse name: Not on file  . Number of children: 4  . Years of education: Not on file  . Highest education level: Not on file  Occupational History  . Occupation: retired Lawyer: RETIRED  Social Needs  . Financial resource strain: Not on file  . Food insecurity    Worry: Not on file    Inability: Not on file  . Transportation needs    Medical: Not on file    Non-medical: Not on file  Tobacco Use  . Smoking status: Never Smoker  . Smokeless tobacco: Never Used  Substance and Sexual Activity  . Alcohol use: No  . Drug use: No  . Sexual activity: Not on file  Lifestyle  . Physical activity    Days per week: Not on file    Minutes per session: Not on file  . Stress: Not on file  Relationships  . Social Herbalist on phone: Not on file    Gets together: Not on file    Attends religious service: Not on file    Active member of club or organization: Not on file    Attends meetings of clubs or organizations: Not on file  Relationship status: Not on file  . Intimate partner violence    Fear of current or ex partner: Not on file    Emotionally abused: Not on file    Physically abused: Not on file    Forced sexual activity: Not on file  Other Topics Concern  . Not on file  Social History Narrative  . Not on file   ROS  Review of Systems  Constitution: Negative for chills, decreased appetite, malaise/fatigue and weight gain.  Cardiovascular: Positive for dyspnea on exertion. Negative for leg swelling and syncope.  Endocrine: Negative for cold intolerance.  Hematologic/Lymphatic: Does not bruise/bleed easily.  Musculoskeletal: Positive for joint pain (right knee worse than left), muscle cramps (at night) and stiffness (right hip after he slipped a step). Negative for joint swelling.  Gastrointestinal: Negative for abdominal pain, anorexia, change in bowel habit, hematochezia and  melena.  Neurological: Negative for headaches and light-headedness.  Psychiatric/Behavioral: Negative for depression and substance abuse.  All other systems reviewed and are negative.  Objective   Vitals with BMI 05/24/2019 07/13/2016 07/12/2016  Height 5' 6"  - -  Weight 171 lbs 13 oz - -  BMI 62.26 - -  Systolic 333 545 625  Diastolic 80 52 60  Pulse 60 69 95    Blood pressure (!) 152/80, pulse 60, temperature (!) 96.6 F (35.9 C), height 5' 6"  (1.676 m), weight 171 lb 12.8 oz (77.9 kg), SpO2 98 %. Body mass index is 27.73 kg/m.   Physical Exam  Constitutional:  Moderately built and well-nourished in no acute distress.  HENT:  Head: Atraumatic.  Eyes: Conjunctivae are normal.  Neck: Neck supple. No JVD present. No thyromegaly present.  Cardiovascular: Normal rate, regular rhythm, normal heart sounds, intact distal pulses and normal pulses. Exam reveals no gallop.  No murmur heard. Pulses:      Carotid pulses are 2+ on the right side and 2+ on the left side. No leg edema, no JVD.  Pulmonary/Chest: Effort normal and breath sounds normal.  Abdominal: Soft. Bowel sounds are normal.  Musculoskeletal: Normal range of motion.  Neurological: He is alert.  Skin: Skin is warm and dry.  Psychiatric: He has a normal mood and affect.   Radiology: No results found.  Laboratory examination:   Labs 12/21/2018: HB 13.7/HCT 41.8, platelets 173.  Serum glucose 100 mg, BUN 28, creatinine 1.18, eGFR 58 mL, potassium 4.5.  A1c 6.0%.  Total cholesterol 148, diglycerides 142, HDL 39, LDL 81.  PSA normal at 2.7.  No results for input(s): NA, K, CL, CO2, GLUCOSE, BUN, CREATININE, CALCIUM, GFRNONAA, GFRAA in the last 8760 hours. CMP Latest Ref Rng & Units 07/13/2016 07/11/2016 07/10/2016  Glucose 65 - 99 mg/dL 123(H) 121(H) -  BUN 6 - 20 mg/dL 24(H) 23(H) -  Creatinine 0.61 - 1.24 mg/dL 1.31(H) 1.26(H) 1.18  Sodium 135 - 145 mmol/L 131(L) 136 -  Potassium 3.5 - 5.1 mmol/L 4.2 4.3 -  Chloride 101  - 111 mmol/L 101 103 -  CO2 22 - 32 mmol/L 23 24 -  Calcium 8.9 - 10.3 mg/dL 8.2(L) 8.4(L) -  Total Protein 6.5 - 8.1 g/dL - - -  Total Bilirubin 0.3 - 1.2 mg/dL - - -  Alkaline Phos 38 - 126 U/L - - -  AST 15 - 41 U/L - - -  ALT 17 - 63 U/L - - -   CBC Latest Ref Rng & Units 07/13/2016 07/12/2016 07/11/2016  WBC 4.0 - 10.5 K/uL 12.2(H) 13.0(H) 13.2(H)  Hemoglobin 13.0 - 17.0 g/dL 11.1(L) 11.9(L) 12.3(L)  Hematocrit 39.0 - 52.0 % 33.4(L) 36.1(L) 38.1(L)  Platelets 150 - 400 K/uL 174 170 185   Lipid Panel  No results found for: CHOL, TRIG, HDL, CHOLHDL, VLDL, LDLCALC, LDLDIRECT HEMOGLOBIN A1C No results found for: HGBA1C, MPG TSH No results for input(s): TSH in the last 8760 hours. Medications and allergies   Allergies  Allergen Reactions  . Shellfish Allergy Nausea And Vomiting  . Sulfa Antibiotics Other (See Comments)    Feels fatigued and weak when taking sulfa for long durations      Prior to Admission medications   Medication Sig Start Date End Date Taking? Authorizing Provider  acetaminophen (TYLENOL) 500 MG tablet Take 1,000 mg by mouth every 6 (six) hours as needed.    [provider]  acetaminophen (TYLENOL) 650 MG CR tablet Take 1,300 mg by mouth daily as needed for pain.    [provider]  alfuzosin (UROXATRAL) 10 MG 24 hr tablet Take 10 mg by mouth at bedtime.    [provider]  aspirin 81 MG tablet Take 81 mg by mouth at bedtime.     [provider]  atorvastatin (LIPITOR) 40 MG tablet Take 40 mg by mouth every evening. 12/20/15   [provider]  cetirizine (ZYRTEC) 10 MG tablet Take 10 mg by mouth daily before breakfast.     [provider]  eplerenone (INSPRA) 25 MG tablet TAKE 1 TABLET EVERY MORNING 01/31/19   Adrian Prows, MD  ferrous sulfate 325 (65 FE) MG EC tablet Take 325 mg by mouth every evening.     [provider]  finasteride (PROSCAR) 5 MG tablet Take 5 mg by mouth every evening.     [provider]  furosemide (LASIX) 40 MG tablet Take 40 mg by mouth daily as needed for edema.    [provider]  gabapentin (NEURONTIN) 300 MG capsule Take 300 mg by mouth 2 (two) times daily.    [provider]  methocarbamol (ROBAXIN) 500 MG tablet Take 1 tablet (500 mg total) by mouth 3 (three) times daily as needed. 07/10/16   Netta Cedars, MD  montelukast (SINGULAIR) 10 MG tablet Take 10 mg by mouth every morning.    [provider]  Multiple Vitamin (MULTIVITAMIN) tablet Take 1 tablet by mouth at bedtime.     [provider]  oxyCODONE-acetaminophen (ROXICET) 5-325 MG tablet Take 1-2 tablets by mouth every 4 (four) hours as needed for severe pain. 07/10/16   Netta Cedars, MD  pantoprazole (PROTONIX) 20 MG tablet Take 40 mg by mouth daily before breakfast.     [provider]  sildenafil (REVATIO) 20 MG tablet Take 20-40 mg by mouth daily as needed (ED).    [provider]  sildenafil (VIAGRA) 100 MG tablet Take 100 mg by mouth daily as needed for erectile dysfunction.    [provider]  vitamin C (ASCORBIC ACID) 500 MG tablet Take 500 mg by mouth 2 (two) times daily.    [provider]  warfarin (COUMADIN) 5 MG tablet Take 1 tablet (5 mg total) by mouth daily. Take as directed per the pharmacist for INR target from 2.5-3.0 for 30 days post op 07/10/16   Netta Cedars, MD     Current Outpatient Medications  Medication Instructions  . acetaminophen (TYLENOL) 1,300 mg, Oral, Daily PRN  . acetaminophen (TYLENOL) 1,000 mg, Oral, Every 6 hours PRN  . alfuzosin (UROXATRAL) 10 mg, Oral, Daily  at bedtime  . aspirin 81 mg, Oral, Daily at bedtime  . atorvastatin (LIPITOR) 40 mg, Oral, Every evening  . cetirizine (ZYRTEC) 10 mg, Oral, Daily before breakfast  . eplerenone (INSPRA) 25 MG tablet TAKE 1 TABLET EVERY MORNING  . ferrous sulfate 325 mg, Oral, Every evening  . finasteride (PROSCAR) 5 mg, Oral, Every evening  .  furosemide (LASIX) 40 mg, Oral, Daily PRN  . gabapentin (NEURONTIN) 300 mg, 2 times daily  . methocarbamol (ROBAXIN) 500 mg, Oral, 3 times daily PRN  . montelukast (SINGULAIR) 10 mg, Oral,  Every morning - 10a  . Multiple Vitamin (MULTIVITAMIN) tablet 1 tablet, Oral, Daily at bedtime  . oxyCODONE-acetaminophen (ROXICET) 5-325 MG tablet 1-2 tablets, Oral, Every 4 hours PRN  . pantoprazole (PROTONIX) 40 mg, Oral, Daily before breakfast  . sildenafil (REVATIO) 20-40 mg, Oral, Daily PRN  . sildenafil (VIAGRA) 100 mg, Oral, Daily PRN  . vitamin C (ASCORBIC ACID) 500 mg, Oral, 2 times daily  . warfarin (COUMADIN) 5 mg, Oral, Daily, Take as directed per the pharmacist for INR target from 2.5-3.0 for 30 days post op    Cardiac Studies:   Coronary Angiography H/O Mild MI-Heart cath/stent in 2007 [facility]), Mid RCA 3.5x20 mm Taxus stent (DES).  Exercise myoview stress 05/06/2015: 1. The resting electrocardiogram demonstrated normal sinus rhythm and normal resting conduction. Poor R wave progression. LAD, cannot exclude anterior and inferior infarct, old. Stress EKG is negative for ischemia.  The patient performed treadmill exercise using a Bruce protocol, completing 5:18 minutes & completed an estimated workload of 7.05 METS. Stress symptoms included dyspnea. 2. Myocardial perfusion imaging is normal. Overall left ventricular systolic function was normal without regional wall motion abnormalities. The left ventricular ejection fraction was 50%.  Echo- 05/09/2015 1. Left ventricle cavity is normal in size. Normal global wall motion. Visual EF is 60-65%. Doppler evidence of grade I (impaired) diastolic dysfunction. Calculated EF 69%. 2. Left atrial cavity is mild to moderately dilated. 3. Mild mitral regurgitation. 4. Trace tricuspid regurgitation. 5. Small pericardial effusion with clear fluids. 6. c.f. echo. of 02/22/2012, small pericardial effusion is new, no other diagnostic change   Chest x-ray  05/03/2015: Right-sided aortic arch.  Cardiomegaly.  No pulmonary venous congestion.  Low lung volumes with mild bibasilar atelectasis and/or infiltrates.  Assessment     ICD-10-CM   1. Atrial fibrillation, chronic (HCC)  I48.20 EKG 12-Lead  2. Coronary artery disease involving native coronary artery of native heart without angina pectoris  I25.10     EKG 05/24/2019: Sinus bradycardia at rate of 58 bpm, inferior infarct old, anterolateral lateral infarct old.  There is no evidence of ischemia.  Normal QT interval. No significant change from  EKG 05/25/2018   Recommendations:   Patient's clinical exam is significantly changed since last office visit however due to worsening dyspnea, we will proceed with obtaining a stress test.  He also needs an echocardiogram. Schedule for a Lexiscan Sestamibi stress test to evaluate for myocardial ischemia. Patient unable to do treadmill stress testing due to dyspnea. Will schedule for an echocardiogram.   I reviewed his labs from his PCP, lipids are under good control.  We could improve upon his LDL even further for MANY years has done well, continue the same.  He is presently 83 years of age.  Renal function also is remained stable.  His blood pressure was initially evaluated in office today. Patient at home has excellent BP control and addition of ACEi caused severe dizziness and  low BP. Continue Inspra.   Adrian Prows, MD, [facility]  05/24/2019, 10:30 AM [facility] [place] Pager: [phone] Office: [phone] If no answer Cell [phone]

## 2019-07-31 ENCOUNTER — Other Ambulatory Visit: Payer: Self-pay

## 2019-07-31 DIAGNOSIS — I482 Chronic atrial fibrillation, unspecified: Secondary | ICD-10-CM

## 2019-07-31 MED ORDER — EPLERENONE 25 MG PO TABS: 25.0000 mg | ORAL_TABLET | Freq: Every morning | ORAL | 1 refills | Status: DC

## 2019-09-13 ENCOUNTER — Other Ambulatory Visit: Payer: Self-pay

## 2019-09-13 DIAGNOSIS — I482 Chronic atrial fibrillation, unspecified: Secondary | ICD-10-CM

## 2019-09-13 MED ORDER — EPLERENONE 25 MG PO TABS: 25.0000 mg | ORAL_TABLET | Freq: Every morning | ORAL | 1 refills | Status: DC

## 2020-01-24 ENCOUNTER — Other Ambulatory Visit: Payer: Self-pay | Admitting: Cardiology

## 2020-01-24 DIAGNOSIS — I482 Chronic atrial fibrillation, unspecified: Secondary | ICD-10-CM

## 2020-02-01 ENCOUNTER — Ambulatory Visit: Payer: Medicare Other | Admitting: Cardiology

## 2020-02-01 ENCOUNTER — Encounter: Payer: Self-pay | Admitting: Cardiology

## 2020-02-01 ENCOUNTER — Other Ambulatory Visit: Payer: Self-pay

## 2020-02-01 VITALS — BP 132/74 | HR 63 | Resp 16 | Ht 66.0 in | Wt 167.2 lb

## 2020-02-01 DIAGNOSIS — E7800 Pure hypercholesterolemia, unspecified: Secondary | ICD-10-CM

## 2020-02-01 DIAGNOSIS — I2510 Atherosclerotic heart disease of native coronary artery without angina pectoris: Secondary | ICD-10-CM

## 2020-02-01 DIAGNOSIS — I10 Essential (primary) hypertension: Secondary | ICD-10-CM

## 2020-02-01 DIAGNOSIS — R0609 Other forms of dyspnea: Secondary | ICD-10-CM

## 2020-02-01 MED ORDER — EZETIMIBE 10 MG PO TABS: 10.0000 mg | ORAL_TABLET | Freq: Every day | ORAL | 2 refills | Status: DC

## 2020-02-01 NOTE — Progress Notes (Signed)
Primary Physician/Referring:  Jani Gravel, MD  Patient ID: Scott [name], male    DOB: Nov 25, 1935, 84 y.o.   MRN: [id number]  Chief Complaint  Patient presents with  . Shortness of Breath  . Coronary Artery Disease  . Atrial Fibrillation  . Follow-up    6 MONTH,  C/O SOB   HPI:    Scott [name]  is a 84 y.o. with hypertension, hyperlipidemia, obstructive sleep apnea on CPAP and is compliant, coronary artery disease and h/o PTCA Mid RCA 3.5x20 mm Taxus stent (DES) in 2007 when he presented with AMI while visiting [place].   He is here on a 6 month OV. He is doing well but continue to have dyspnea on exertion and has progressed since last year and has to stop frequently during activity.  He is also noticed occasional episodes of wheezing with activity.  No chest pain.  He denies any leg edema, no PND or orthopnea.  Past Medical History:  Diagnosis Date  . Acid reflux    history of Barretts esophagus   . Adenomatous colon polyp 01/2002  . Arthritis 2004  . Coronary artery disease 2007   cardiac cath w/ angioplasty & stent placement x 1  . Enlarged prostate   . Hyperlipidemia   . Hypertension   . Myocardial infarction [facility]) 2007   while vacationing in [place], cath. done there & x1 stent placed   . Neuromuscular disorder ([place])    carpal tunnel - both hands   . Prostate cancer [facility]) 2013   biopsy- active survelliance, annual biopsies   . Sepsis secondary to UTI ([place])   . Shortness of breath dyspnea   . Sleep apnea 2001  . Venous insufficiency    Past Surgical History:  Procedure Laterality Date  . ANGIOPLASTY  09/18/2005   1 stent placed  . APPENDECTOMY  1989  . PROSTATE BIOPSY  12/30/2000  . ROTATOR CUFF REPAIR  2004   right  . TONSILLECTOMY AND ADENOIDECTOMY  1950  . TOTAL KNEE ARTHROPLASTY Left 07/10/2016   Procedure: TOTAL KNEE ARTHROPLASTY;  Surgeon: Netta Cedars, MD;  Location: [facility];  Service: Orthopedics;  Laterality: Left;  Marland Kitchen VASECTOMY  1970   Social History   Tobacco  Use  . Smoking status: Never Smoker  . Smokeless tobacco: Never Used  Substance Use Topics  . Alcohol use: No   Marital Status: Married   ROS  Review of Systems  Cardiovascular: Positive for dyspnea on exertion. Negative for chest pain and leg swelling.  Respiratory: Positive for wheezing (occasaional).   Musculoskeletal: Positive for arthritis and joint pain.  Gastrointestinal: Positive for heartburn (controlled by PPI). Negative for melena.   Objective   Vitals with BMI 02/01/2020 05/24/2019 07/13/2016  Height 5' 6" 5' 6" -  Weight 167 lbs 3 oz 171 lbs 13 oz -  BMI 27 23.55 -  Systolic 732 202 542  Diastolic 74 80 52  Pulse 63 60 69    Blood pressure 132/74, pulse 63, resp. rate 16, height 5' 6" (1.676 m), weight 167 lb 3.2 oz (75.8 kg), SpO2 97 %. Body mass index is 26.99 kg/m.   Physical Exam Constitutional:      Comments: Moderately built and well-nourished in no acute distress.  Eyes:     Conjunctiva/sclera: Conjunctivae normal.  Neck:     Thyroid: No thyromegaly.     Vascular: No JVD.  Cardiovascular:     Rate and Rhythm: Normal rate and regular rhythm.  Pulses: Normal pulses and intact distal pulses.          Carotid pulses are 2+ on the right side and 2+ on the left side.    Heart sounds: Normal heart sounds. No murmur heard.  No gallop.      Comments: No leg edema, no JVD. Pulmonary:     Effort: Pulmonary effort is normal.     Breath sounds: Normal breath sounds.  Abdominal:     General: Bowel sounds are normal.     Palpations: Abdomen is soft.    Radiology: No results found.  Laboratory examination:   External Labs:  Cholesterol, total 167.000 M 12/28/2019 Triglycerides 88.000 12/28/2019 HDL 45.000 12/28/2019 LDL-C 81.000 M 12/21/2018  A1C 5.900 % 07/03/2019  Hemoglobin N/D  Creatinine, Serum 1.120 MG/ 12/28/2019 Potassium 4.500 MM 06/02/2016 Magnesium N/D ALT (SGPT) 16.000 IU/ 12/28/2019 TSH 1.410 12/28/2019  Labs 12/21/2018:   HB  13.7/HCT 41.8, platelets 173.  Serum glucose 100 mg, BUN 28, creatinine 1.18, eGFR 58 mL, potassium 4.5.  A1c 6.0%.    Total cholesterol 148, diglycerides 142, HDL 39, LDL 81.  PSA normal at 2.7.  Medications and allergies   Allergies  Allergen Reactions  . Shellfish Allergy Nausea And Vomiting  . Sulfa Antibiotics Other (See Comments)    Feels fatigued and weak when taking sulfa for long durations      Current Outpatient Medications  Medication Instructions  . alfuzosin (UROXATRAL) 10 mg, Oral, Daily at bedtime  . aspirin 81 mg, Oral, Daily at bedtime  . atorvastatin (LIPITOR) 40 mg, Oral, Every evening  . cetirizine (ZYRTEC) 10 mg, Oral, Daily before breakfast  . eplerenone (INSPRA) 25 MG tablet TAKE 1 TABLET BY MOUTH IN  THE MORNING  . ezetimibe (ZETIA) 10 mg, Oral, Daily after supper  . ferrous sulfate 325 mg, Oral, Every evening  . finasteride (PROSCAR) 5 mg, Oral, Every evening  . furosemide (LASIX) 40 mg, Oral, Daily PRN  . gabapentin (NEURONTIN) 300 mg, 2 times daily  . montelukast (SINGULAIR) 10 mg, Oral,  Every morning - 10a  . Multiple Vitamin (MULTIVITAMIN) tablet 1 tablet, Oral, Daily at bedtime  . oxybutynin (DITROPAN) 5 mg, Oral, 2 times daily  . pantoprazole (PROTONIX) 40 mg, Oral, Daily before breakfast  . vitamin C (ASCORBIC ACID) 500 mg, Oral, 2 times daily    Cardiac Studies:   Coronary Angiography H/O Mild MI-Heart cath/stent in 2007 [facility]), Mid RCA 3.5x20 mm Taxus stent (DES).  Exercise myoview stress 05/06/2015: 1. The resting electrocardiogram demonstrated normal sinus rhythm and normal resting conduction. Poor R wave progression. LAD, cannot exclude anterior and inferior infarct, old. Stress EKG is negative for ischemia.  The patient performed treadmill exercise using a Bruce protocol, completing 5:18 minutes & completed an estimated workload of 7.05 METS. Stress symptoms included dyspnea. 2. Myocardial perfusion imaging is normal. Overall left  ventricular systolic function was normal without regional wall motion abnormalities. The left ventricular ejection fraction was 50%.  Echo- 05/09/2015 1. Left ventricle cavity is normal in size. Normal global wall motion. Visual EF is 60-65%. Doppler evidence of grade I (impaired) diastolic dysfunction. Calculated EF 69%. 2. Left atrial cavity is mild to moderately dilated. 3. Mild mitral regurgitation. 4. Trace tricuspid regurgitation. 5. Small pericardial effusion with clear fluids. 6. c.f. echo. of 02/22/2012, small pericardial effusion is new, no other diagnostic change  EKG 02/01/2020: Normal sinus rhythm at rate of 58 bpm, inferior infarct old.  Anterolateral infarct old.  No evidence of ischemia.  No significant change from 05/24/2019.  Assessment     ICD-10-CM   1. Coronary artery disease involving native coronary artery of native heart without angina pectoris  I25.10 EKG 12-Lead    PCV ECHOCARDIOGRAM COMPLETE    PCV MYOCARDIAL PERFUSION WO LEXISCAN  2. Dyspnea on exertion  R06.00 PCV ECHOCARDIOGRAM COMPLETE    PCV MYOCARDIAL PERFUSION WO LEXISCAN    DG Chest 2 View  3. Essential hypertension  I10   4. Hypercholesteremia  E78.00 ezetimibe (ZETIA) 10 MG tablet    Lipid Panel With LDL/HDL Ratio     Recommendations:   JAELYN CLONINGER  is a 84 y.o. with hypertension, hyperlipidemia, obstructive sleep apnea on CPAP and is compliant, coronary artery disease and h/o PTCA Mid RCA 3.5x20 mm Taxus stent (DES) in 2007 when he presented with AMI while visiting [place].  On his last office visit 6 months ago due to worsening dyspnea, I recommended echocardiogram and stress test.  Inadvertently it was not set up.  He is concerned about ongoing dyspnea and states that it is gotten even worse since last time.  I will set him up for an echocardiogram, will also attempt treadmill nuclear stress test if unable to he can switch to [place].  I will also obtain chest x-ray PA and lateral view to evaluate  his dyspnea.  With regard to hyperlipidemia, would like to achieve LDL goal to <70.  I will add Zetia 10 mg daily, will obtain lipid profile testing in 1 month.  Blood pressures well controlled on present medications, renal function has remained stable.  I reviewed his external labs from PCP.  I will see him back in 6 months and make further recommendations.   Adrian Prows, MD, [facility] 02/01/2020, 5:14 PM Office: [phone]

## 2020-02-07 ENCOUNTER — Other Ambulatory Visit: Payer: Self-pay

## 2020-02-07 ENCOUNTER — Ambulatory Visit
Admission: RE | Admit: 2020-02-07 | Discharge: 2020-02-07 | Disposition: A | Discharge status: Home / Self Care | Payer: Medicare Other | Source: Ambulatory Visit | Attending: Cardiology | Admitting: Cardiology

## 2020-02-07 ENCOUNTER — Ambulatory Visit: Payer: Medicare Other

## 2020-02-07 DIAGNOSIS — R0609 Other forms of dyspnea: Secondary | ICD-10-CM

## 2020-02-07 DIAGNOSIS — I2510 Atherosclerotic heart disease of native coronary artery without angina pectoris: Secondary | ICD-10-CM

## 2020-02-13 ENCOUNTER — Ambulatory Visit: Payer: Medicare Other

## 2020-02-13 ENCOUNTER — Other Ambulatory Visit: Payer: Self-pay

## 2020-02-13 DIAGNOSIS — I2510 Atherosclerotic heart disease of native coronary artery without angina pectoris: Secondary | ICD-10-CM

## 2020-02-13 DIAGNOSIS — R0609 Other forms of dyspnea: Secondary | ICD-10-CM

## 2020-03-09 LAB — LIPID PANEL WITH LDL/HDL RATIO
Cholesterol, Total: 105 mg/dL (ref 100–199)
HDL: 35 mg/dL — ABNORMAL LOW (ref >39)
LDL Chol Calc (NIH): 49 mg/dL (ref 0–99)
LDL/HDL Ratio: 1.4 ratio (ref 0.0–3.6)
Triglycerides: 114 mg/dL (ref 0–149)
VLDL Cholesterol Cal: 21 mg/dL (ref 5–40)

## 2020-03-18 ENCOUNTER — Encounter: Payer: Self-pay | Admitting: Cardiology

## 2020-03-18 ENCOUNTER — Ambulatory Visit: Payer: Medicare Other | Admitting: Cardiology

## 2020-03-18 ENCOUNTER — Other Ambulatory Visit: Payer: Self-pay

## 2020-03-18 VITALS — BP 115/60 | HR 81 | Resp 16 | Ht 66.0 in | Wt 174.6 lb

## 2020-03-18 DIAGNOSIS — R0609 Other forms of dyspnea: Secondary | ICD-10-CM

## 2020-03-18 DIAGNOSIS — I2510 Atherosclerotic heart disease of native coronary artery without angina pectoris: Secondary | ICD-10-CM

## 2020-03-18 DIAGNOSIS — I10 Essential (primary) hypertension: Secondary | ICD-10-CM

## 2020-03-18 DIAGNOSIS — E7800 Pure hypercholesterolemia, unspecified: Secondary | ICD-10-CM

## 2020-03-18 MED ORDER — EZETIMIBE 10 MG PO TABS: 10.0000 mg | ORAL_TABLET | Freq: Every day | ORAL | 3 refills | Status: DC

## 2020-03-18 NOTE — Progress Notes (Signed)
Primary Physician/Referring:  Jani Gravel, MD  Patient ID: Scott [name], male    DOB: [birth year], 84 y.o.   MRN: [id number]  Chief Complaint  Patient presents with  . Coronary Artery Disease  . Shortness of Breath  . Hyperlipidemia  . Follow-up    6 week   HPI:    Scott [name]  is a 84 y.o. with hypertension, hyperlipidemia, obstructive sleep apnea on CPAP and is compliant, coronary artery disease and h/o PTCA Mid RCA 3.5x20 mm Taxus stent (DES) in 2007 when he presented with AMI while visiting [place].   On his last office visit due to worsening dyspnea, I had recommended obtaining an echocardiogram and also nuclear stress test.  I also performed chest x-ray and lipid profile testing after adding Zetia.  He now presents for follow-up.  No change in his symptoms.  No chest pain.  He denies any leg edema, no PND or orthopnea.  Past Medical History:  Diagnosis Date  . Acid reflux    history of Barretts esophagus   . Adenomatous colon polyp 01/2002  . Arthritis 2004  . Coronary artery disease 2007   cardiac cath w/ angioplasty & stent placement x 1  . Enlarged prostate   . Hyperlipidemia   . Hypertension   . Myocardial infarction [facility]) 2007   while vacationing in [place], cath. done there & x1 stent placed   . Neuromuscular disorder ([place])    carpal tunnel - both hands   . Prostate cancer [facility]) 2013   biopsy- active survelliance, annual biopsies   . Sepsis secondary to UTI ([place])   . Shortness of breath dyspnea   . Sleep apnea 2001  . Venous insufficiency    Past Surgical History:  Procedure Laterality Date  . ANGIOPLASTY  09/18/2005   1 stent placed  . APPENDECTOMY  1989  . PROSTATE BIOPSY  12/30/2000  . ROTATOR CUFF REPAIR  2004   right  . TONSILLECTOMY AND ADENOIDECTOMY  1950  . TOTAL KNEE ARTHROPLASTY Left 07/10/2016   Procedure: TOTAL KNEE ARTHROPLASTY;  Surgeon: Netta Cedars, MD;  Location: [place];  Service: Orthopedics;  Laterality: Left;  Marland Kitchen VASECTOMY  1970   Social  History   Tobacco Use  . Smoking status: Never Smoker  . Smokeless tobacco: Never Used  Substance Use Topics  . Alcohol use: No   Marital Status: Married   ROS  Review of Systems  Cardiovascular: Positive for dyspnea on exertion. Negative for chest pain and leg swelling.  Respiratory: Positive for wheezing (occasaional).   Musculoskeletal: Positive for arthritis and joint pain.  Gastrointestinal: Positive for heartburn (controlled by PPI). Negative for melena.   Objective   Vitals with BMI 03/18/2020 02/01/2020 05/24/2019  Height 5' 6"  5' 6"  5' 6"   Weight 174 lbs 10 oz 167 lbs 3 oz 171 lbs 13 oz  BMI 06.30 27 16.01  Systolic 093 235 573  Diastolic 60 74 80  Pulse 81 63 60    Blood pressure 115/60, pulse 81, resp. rate 16, height 5' 6"  (1.676 m), weight 174 lb 9.6 oz (79.2 kg), SpO2 95 %. Body mass index is 28.18 kg/m.   Physical Exam Constitutional:      Comments: Moderately built and well-nourished in no acute distress.  Eyes:     Conjunctiva/sclera: Conjunctivae normal.  Neck:     Thyroid: No thyromegaly.     Vascular: No JVD.  Cardiovascular:     Rate and Rhythm: Normal rate and regular rhythm.  Pulses: Normal pulses and intact distal pulses.          Carotid pulses are 2+ on the right side and 2+ on the left side.    Heart sounds: Normal heart sounds. No murmur heard.  No gallop.      Comments: No leg edema, no JVD. Pulmonary:     Effort: Pulmonary effort is normal.     Breath sounds: Normal breath sounds.  Abdominal:     General: Bowel sounds are normal.     Palpations: Abdomen is soft.    Laboratory examination:   External Labs:  Lipid Panel     Component Value Date/Time   CHOL 105 03/08/2020 0900   TRIG 114 03/08/2020 0900   HDL 35 (L) 03/08/2020 0900   LDLCALC 49 03/08/2020 0900   LABVLDL 21 03/08/2020 0900    Cholesterol, total 167.000 M 12/28/2019 Triglycerides 88.000 12/28/2019 HDL 45.000 12/28/2019 LDL-C 81.000 M 12/21/2018  A1C 5.900 %  07/03/2019  Hemoglobin N/D  Creatinine, Serum 1.120 MG/ 12/28/2019 Potassium 4.500 MM 06/02/2016 Magnesium N/D ALT (SGPT) 16.000 IU/ 12/28/2019 TSH 1.410 12/28/2019  Labs 12/21/2018:   HB 13.7/HCT 41.8, platelets 173.  Serum glucose 100 mg, BUN 28, creatinine 1.18, eGFR 58 mL, potassium 4.5.  A1c 6.0%.    Total cholesterol 148, diglycerides 142, HDL 39, LDL 81.  PSA normal at 2.7.  Medications and allergies   Allergies  Allergen Reactions  . Shellfish Allergy Nausea And Vomiting  . Sulfa Antibiotics Other (See Comments)    Feels fatigued and weak when taking sulfa for long durations     Outpatient Medications Prior to Visit  Medication Sig Dispense Refill  . alfuzosin (UROXATRAL) 10 MG 24 hr tablet Take 10 mg by mouth at bedtime.    Marland Kitchen aspirin 81 MG tablet Take 81 mg by mouth at bedtime.     Marland Kitchen atorvastatin (LIPITOR) 40 MG tablet Take 40 mg by mouth every evening.    . cetirizine (ZYRTEC) 10 MG tablet Take 10 mg by mouth daily before breakfast.     . eplerenone (INSPRA) 25 MG tablet TAKE 1 TABLET BY MOUTH IN  THE MORNING 90 tablet 3  . ferrous sulfate 325 (65 FE) MG EC tablet Take 325 mg by mouth every evening.     . finasteride (PROSCAR) 5 MG tablet Take 5 mg by mouth every evening.     . fluticasone (FLONASE) 50 MCG/ACT nasal spray Place into both nostrils daily.    Marland Kitchen gabapentin (NEURONTIN) 300 MG capsule Take 300 mg by mouth 2 (two) times daily.    . montelukast (SINGULAIR) 10 MG tablet Take 10 mg by mouth every morning.    . Multiple Vitamin (MULTIVITAMIN) tablet Take 1 tablet by mouth at bedtime.     Marland Kitchen oxybutynin (DITROPAN) 5 MG tablet Take 5 mg by mouth 2 (two) times daily.    . pantoprazole (PROTONIX) 20 MG tablet Take 40 mg by mouth daily before breakfast.     . vitamin C (ASCORBIC ACID) 500 MG tablet Take 500 mg by mouth 2 (two) times daily.    Marland Kitchen ezetimibe (ZETIA) 10 MG tablet Take 1 tablet (10 mg total) by mouth daily after supper. 30 tablet 2  . furosemide (LASIX) 40 MG  tablet Take 40 mg by mouth daily as needed for edema. (Patient not taking: Reported on 03/18/2020)     No facility-administered medications prior to visit.   Meds ordered this encounter  Medications  . ezetimibe (ZETIA) 10 MG tablet  Sig: Take 1 tablet (10 mg total) by mouth daily after supper.    Dispense:  90 tablet    Refill:  3   Medications Discontinued During This Encounter  Medication Reason  . ezetimibe (ZETIA) 10 MG tablet Reorder  . furosemide (LASIX) 40 MG tablet Patient has not taken in last 30 days       Radiology:  CXR 02/07/2020: Lung volumes are low. No consolidative airspace disease. No pleural effusions. No pneumothorax. No pulmonary nodule or mass noted. Right-sided aortic arch (normal anatomical variant) incidentally noted. Pulmonary vasculature and the cardiomediastinal silhouette are otherwise within normal limits. Aortic atherosclerosis.    Cardiac Studies:   Coronary Angiography H/O Mild MI-Heart cath/stent in 2007 [facility]), Mid RCA 3.5x20 mm Taxus stent (DES).  Lexiscan/modified Bruce Tetrofosmin stress test 02/14/2020: Lexiscan/modified Bruce nuclear stress test performed using 1-day protocol. Stress EKG is non-diagnostic, as this is pharmacological stress test. In addition, stress EKG at 110% MPHR showed sinus tachycardia, old inferior infarct. Mild decrease in reduced radiotracer update in inferior myocardium at rest and stress. In setting of normal myocardial thickening, this likely represents tissue attenuation artifact. Stress LVEF 77%. Low risk study.  Echocardiogram 02/07/2020:  Normal LV systolic function with EF 59%. Left ventricle cavity is normal  in size. Mild concentric hypertrophy of the left ventricle. Normal global  wall motion. Doppler evidence of grade I (impaired) diastolic dysfunction,  normal LAP. Calculated EF 59%.  Left atrial cavity is moderately dilated.  Structurally normal mitral valve.  Mild (Grade I) mitral regurgitation.   Pericardium is normal. Insignificant pericardial effusion with clear  fluids.  IVC is dilated with respiratory variation. This may suggest elevated right  heart pressure  No significant change from 05/09/2015.   EKG:  EKG 02/01/2020: Normal sinus rhythm at rate of 58 bpm, inferior infarct old.  Anterolateral infarct old.  No evidence of ischemia.  No significant change from 05/24/2019.  Assessment     ICD-10-CM   1. Coronary artery disease involving native coronary artery of native heart without angina pectoris  I25.10   2. Dyspnea on exertion  R06.00 Ambulatory referral to Pulmonology  3. Essential hypertension  I10   4. Hypercholesteremia  E78.00 ezetimibe (ZETIA) 10 MG tablet     Recommendations:   Scott [name]  is a 84 y.o. with hypertension, hyperlipidemia, obstructive sleep apnea on CPAP and is compliant, coronary artery disease and h/o PTCA Mid RCA 3.5x20 mm Taxus stent (DES) in 2007 when he presented with AMI while visiting [place].  On his last office visit due to worsening dyspnea, I had recommended obtaining an echocardiogram and also nuclear stress test.  I also performed chest x-ray and lipid profile testing after adding Zetia.  He now presents for follow-up.  No change in his symptoms.  I reviewed the results of the stress test, essentially low risk stress test.  Echocardiogram also reveals stable LV function in fact LVEF is improved from previous 50%.  He only has grade 1 diastolic dysfunction.  I do not see any cardiac etiology for his worsening dyspnea and reduced exercise tolerance.  Weight loss was discussed with the patient.  I will make a referral to be evaluated from pulmonary standpoint regarding his dyspnea, referral made to Dr. Chase Caller.  I reviewed his lipids, excellent control with addition of Zetia, will continue the same for now.  I will see him back in 6 months for follow-up.   Adrian Prows, MD, [facility] 03/18/2020, 1:23 PM Office: [phone]

## 2020-04-11 ENCOUNTER — Ambulatory Visit (INDEPENDENT_AMBULATORY_CARE_PROVIDER_SITE_OTHER): Payer: Medicare Other | Admitting: Internal Medicine

## 2020-04-11 ENCOUNTER — Other Ambulatory Visit: Payer: Self-pay

## 2020-04-11 ENCOUNTER — Other Ambulatory Visit (INDEPENDENT_AMBULATORY_CARE_PROVIDER_SITE_OTHER): Payer: Medicare Other

## 2020-04-11 ENCOUNTER — Encounter: Payer: Self-pay | Admitting: Internal Medicine

## 2020-04-11 VITALS — BP 116/60 | HR 50 | Temp 96.2°F | Ht 66.0 in | Wt 175.0 lb

## 2020-04-11 DIAGNOSIS — R0600 Dyspnea, unspecified: Secondary | ICD-10-CM

## 2020-04-11 DIAGNOSIS — R0602 Shortness of breath: Secondary | ICD-10-CM

## 2020-04-11 DIAGNOSIS — Z87898 Personal history of other specified conditions: Secondary | ICD-10-CM

## 2020-04-11 DIAGNOSIS — Z862 Personal history of diseases of the blood and blood-forming organs and certain disorders involving the immune mechanism: Secondary | ICD-10-CM | POA: Diagnosis not present

## 2020-04-11 DIAGNOSIS — R053 Chronic cough: Secondary | ICD-10-CM

## 2020-04-11 DIAGNOSIS — R0609 Other forms of dyspnea: Secondary | ICD-10-CM

## 2020-04-11 DIAGNOSIS — N189 Chronic kidney disease, unspecified: Secondary | ICD-10-CM

## 2020-04-11 DIAGNOSIS — R05 Cough: Secondary | ICD-10-CM | POA: Diagnosis not present

## 2020-04-11 LAB — CBC WITH DIFFERENTIAL/PLATELET
Basophils Absolute: 0 10*3/uL (ref 0.0–0.1)
Basophils Relative: 0.5 % (ref 0.0–3.0)
Eosinophils Absolute: 0.2 10*3/uL (ref 0.0–0.7)
Eosinophils Relative: 2.7 % (ref 0.0–5.0)
HCT: 41.2 % (ref 39.0–52.0)
Hemoglobin: 13.6 g/dL (ref 13.0–17.0)
Lymphocytes Relative: 29.8 % (ref 12.0–46.0)
Lymphs Abs: 2.3 10*3/uL (ref 0.7–4.0)
MCHC: 33.1 g/dL (ref 30.0–36.0)
MCV: 95.1 fl (ref 78.0–100.0)
Monocytes Absolute: 1.1 10*3/uL — ABNORMAL HIGH (ref 0.1–1.0)
Monocytes Relative: 14.2 % — ABNORMAL HIGH (ref 3.0–12.0)
Neutro Abs: 4.1 10*3/uL (ref 1.4–7.7)
Neutrophils Relative %: 52.8 % (ref 43.0–77.0)
Platelets: 157 10*3/uL (ref 150.0–400.0)
RBC: 4.33 Mil/uL (ref 4.22–5.81)
RDW: 13.5 % (ref 11.5–15.5)
WBC: 7.8 10*3/uL (ref 4.0–10.5)

## 2020-04-11 LAB — BASIC METABOLIC PANEL
BUN: 27 mg/dL — ABNORMAL HIGH (ref 6–23)
CO2: 25 mEq/L (ref 19–32)
Calcium: 9.1 mg/dL (ref 8.4–10.5)
Chloride: 107 mEq/L (ref 96–112)
Creatinine, Ser: 1.25 mg/dL (ref 0.40–1.50)
GFR: 55.23 mL/min — ABNORMAL LOW (ref >60.00)
Glucose, Bld: 132 mg/dL — ABNORMAL HIGH (ref 70–99)
Potassium: 4.3 mEq/L (ref 3.5–5.1)
Sodium: 139 mEq/L (ref 135–145)

## 2020-04-11 NOTE — Patient Instructions (Signed)
ICD-10-CM   1. Dyspnea on exertion  R06.00   2. Chronic cough  R05   3. History of wheezing  Z87.898   4. History of anemia  Z86.2   5. Chronic kidney disease, unspecified CKD stage  N18.9     Shortness of breath remains unexplained.  Common causes a visceral obesity, diastolic dysfunction or heart muscle stiffness, physical deconditioning.  We need to rule out any asthma or pulmonary fibrosis or other lung diseases  Plan - Do HRCT supine and prone -Do exhaled nitric oxide testing if it is available in our office at the time of your next visit for breathing test -Do full pulmonary function test -Do blood IgE, CBC with differential and chemistry panel today blood work   Followup  - next few to several weeks with me or app to discuss test result  - if CT shows fibrosis (doubt) you need additional blood work and Licensed conveyancer and we will inform you about it

## 2020-04-11 NOTE — Progress Notes (Signed)
OV 04/11/2020  Subjective:  Patient ID: Scott [name], male , DOB: [birth year] , age 84 y.o. , MRN: [id number] , ADDRESS: [place] [id number]  PCP   Jani Gravel, MD  04/11/2020 -   Chief Complaint  Patient presents with  . Consult    DOE     HPI Scott [name] 84 y.o. -referred by for shortness of breath on exertion reli relieved by rest.  He is here at the request of his wife.  Patient is well-known to me.  I I take care of his wife.  He tells me for the last several years he has had insidious onset of shortness of breath with exertion.  Gradually and steadily getting worse.  Dyspnea is rated as a 5 out of 10.  He has associated cough that is rated as a 3 out of 10 and wheezing that is rated as 1 out of 10.  He is associated heavy seasonal allergies that is actually perennial now.  He says when he stops his antihistamines his symptoms come back pretty rapidly.  Nevertheless shortness of breath is experienced when he does grocery shopping or climbs 2 flights of stairs.  He does not have symptoms of one flight of stairs.  When he does grocery shopping his knees bother him more than shortness of breath but when he climbs stairs 2 flights shortness of breath bothers him more than the knees.  There is no associated chest pain.  He had a cardiac stress test this year and it is normal and documented below.  His last CT scan of the chest was in 2017 where he had some pleural effusions.  Of note his shortness of breath is progressive but his cough and wheezing is static  Lab review shows mild anemia in 2017 but no further labs with [place]  He also has mild chronic kidney disease last checked within our health system in 2017.  In 2017 CT chest did show small pericardial effusion  His most recent echocardiogram in June 2021 shows grade 1 diastolic dysfunction.  He has associated visceral obesity.  There is no orthopnea.  He has some mild chronic venous stasis edema particularly on the left  side.  There is no change to this.  There is no hemoptysis.   He has never had allergy tests  Simple office walk 185 feet x  3 laps goal with forehead probe 04/11/2020   O2 used ra  Number laps completed 3  Comments about pace avg  Resting Pulse Ox/HR 98% and 84/min  Final Pulse Ox/HR 98% and 82/min  Desaturated </= 88% no  Desaturated <= 3% points no  Got Tachycardic >/= 90/min no  Symptoms at end of test No dyspnea  Miscellaneous comments x      CT chest June 2017  IMPRESSION: Small bilateral pleural effusions and mild atelectasis/scarring over the lingula and left lower lobe.  Small pericardial effusion.  Three vessel atherosclerotic coronary artery disease. Aortic atherosclerosis.  Right-sided aortic arch with aberrant left subclavian artery.   Electronically Signed   By: Marin Olp M.D.   On: 01/30/2016 15:56   ROS - per HPI  Results for Hocker, AYUB KIRSH "JOE" (MRN [id number]) as of 04/11/2020 09:01  Ref. Range 06/30/2016 11:38 07/10/2016 14:18 07/11/2016 06:12 07/12/2016 03:44 07/13/2016 03:13  Hemoglobin Latest Ref Range: 13.0 - 17.0 g/dL 13.9 13.5 12.3 (L) 11.9 (L) 11.1 (L)  Results for Scott [name]" (MRN [id number]) as  of 04/11/2020 09:01  Ref. Range 01/31/2016 05:58 06/30/2016 11:38 07/10/2016 14:18 07/11/2016 06:12 07/13/2016 03:13  Creatinine Latest Ref Range: 0.61 - 1.24 mg/dL 1.32 (H) 1.11 1.18 1.26 (H) 1.31 (H)    Echocardiogram February 07, 2020  Echocardiogram 02/07/2020:  Normal LV systolic function with EF 59%. Left ventricle cavity is normal  in size. Mild concentric hypertrophy of the left ventricle. Normal global  wall motion. Doppler evidence of grade I (impaired) diastolic dysfunction,  normal LAP. Calculated EF 59%.  Left atrial cavity is moderately dilated.  Structurally normal mitral valve. Mild (Grade I) mitral regurgitation.  Pericardium is normal. Insignificant pericardial effusion with clear  fluids.  IVC is dilated with respiratory  variation. This may suggest elevated right  heart pressure  No significant change from 05/09/2015.  Lexiscan/modified Bruce Tetrofosmin stress test 02/14/2020: Lexiscan/modified Bruce nuclear stress test performed using 1-day protocol. Stress EKG is non-diagnostic, as this is pharmacological stress test. In addition, stress EKG at 110% MPHR showed sinus tachycardia, old inferior infarct. Mild decrease in reduced radiotracer update in inferior myocardium at rest and stress. In setting of normal myocardial thickening, this likely represents tissue attenuation artifact. Stress LVEF 77%. Low risk study.     has a past medical history of Acid reflux, Adenomatous colon polyp (01/2002), Arthritis (2004), Coronary artery disease (2007), Enlarged prostate, Hyperlipidemia, Hypertension, Myocardial infarction ([place]) (2007), Neuromuscular disorder ([place]), Prostate cancer ([place]) (2013), Sepsis secondary to UTI [facility]), Shortness of breath dyspnea, Sleep apnea (2001), and Venous insufficiency.   reports that he has never smoked. He has never used smokeless tobacco.  Past Surgical History:  Procedure Laterality Date  . ANGIOPLASTY  09/18/2005   1 stent placed  . APPENDECTOMY  1989  . PROSTATE BIOPSY  12/30/2000  . ROTATOR CUFF REPAIR  2004   right  . TONSILLECTOMY AND ADENOIDECTOMY  1950  . TOTAL KNEE ARTHROPLASTY Left 07/10/2016   Procedure: TOTAL KNEE ARTHROPLASTY;  Surgeon: Netta Cedars, MD;  Location: [place];  Service: Orthopedics;  Laterality: Left;  Marland Kitchen VASECTOMY  1970    Allergies  Allergen Reactions  . Shellfish Allergy Nausea And Vomiting  . Sulfa Antibiotics Other (See Comments)    Feels fatigued and weak when taking sulfa for long durations      There is no immunization history on file for this patient.  Family History  Problem Relation Age of Onset  . Prostate cancer Brother   . Diabetes Father   . Heart disease Father   . Diabetes Sister   . Diabetes Brother   . Heart disease Mother   .  Diabetes Son      Current Outpatient Medications:  .  alfuzosin (UROXATRAL) 10 MG 24 hr tablet, Take 10 mg by mouth at bedtime., Disp: , Rfl:  .  aspirin 81 MG tablet, Take 81 mg by mouth at bedtime. , Disp: , Rfl:  .  atorvastatin (LIPITOR) 40 MG tablet, Take 40 mg by mouth every evening., Disp: , Rfl:  .  cetirizine (ZYRTEC) 10 MG tablet, Take 10 mg by mouth daily before breakfast. , Disp: , Rfl:  .  eplerenone (INSPRA) 25 MG tablet, TAKE 1 TABLET BY MOUTH IN  THE MORNING, Disp: 90 tablet, Rfl: 3 .  ezetimibe (ZETIA) 10 MG tablet, Take 1 tablet (10 mg total) by mouth daily after supper., Disp: 90 tablet, Rfl: 3 .  ferrous sulfate 325 (65 FE) MG EC tablet, Take 325 mg by mouth every evening. , Disp: , Rfl:  .  finasteride (  PROSCAR) 5 MG tablet, Take 5 mg by mouth every evening. , Disp: , Rfl:  .  fluticasone (FLONASE) 50 MCG/ACT nasal spray, Place into both nostrils daily., Disp: , Rfl:  .  gabapentin (NEURONTIN) 300 MG capsule, Take 300 mg by mouth 2 (two) times daily., Disp: , Rfl:  .  montelukast (SINGULAIR) 10 MG tablet, Take 10 mg by mouth every morning., Disp: , Rfl:  .  Multiple Vitamin (MULTIVITAMIN) tablet, Take 1 tablet by mouth at bedtime. , Disp: , Rfl:  .  oxybutynin (DITROPAN) 5 MG tablet, Take 5 mg by mouth 2 (two) times daily., Disp: , Rfl:  .  pantoprazole (PROTONIX) 20 MG tablet, Take 40 mg by mouth daily before breakfast. , Disp: , Rfl:  .  vitamin C (ASCORBIC ACID) 500 MG tablet, Take 500 mg by mouth 2 (two) times daily., Disp: , Rfl:       Objective:   Vitals:   04/11/20 0901  BP: 116/60  Pulse: (!) 50  Temp: (!) 96.2 F (35.7 C)  TempSrc: Oral  SpO2: 98%  Weight: 175 lb (79.4 kg)  Height: 5\' 6"  (1.676 m)    Estimated body mass index is 28.25 kg/m as calculated from the following:   Height as of this encounter: 5\' 6"  (1.676 m).   Weight as of this encounter: 175 lb (79.4 kg).  @WEIGHTCHANGE @  [employer]   04/11/20 0901  Weight: 175 lb (79.4 kg)       Physical Exam  General Appearance:    Alert, cooperative, no distress, appears stated age - yes , Deconditioned looking - no , OBESE  - no, Sitting on Wheelchair -  no  Head:    Normocephalic, without obvious abnormality, atraumatic  Eyes:    PERRL, conjunctiva/corneas clear,  Ears:    Normal TM's and external ear canals, both ears  Nose:   Nares normal, septum midline, mucosa normal, no drainage    or sinus tenderness. OXYGEN ON  - no . Patient is @ ra   Throat:   Lips, mucosa, and tongue normal; teeth and gums normal. Cyanosis on lips - no  Neck:   Supple, symmetrical, trachea midline, no adenopathy;    thyroid:  no enlargement/tenderness/nodules; no carotid   bruit or JVD  Back:     Symmetric, no curvature, ROM normal, no CVA tenderness  Lungs:     Distress - no , Wheeze no, Barrell Chest - no, Purse lip breathing - no, Crackles - maybe at lung base   Chest Wall:    No tenderness or deformity.    Heart:    Regular rate and rhythm, S1 and S2 normal, no rub   or gallop, Murmur - no  Breast Exam:    NOT DONE  Abdomen:     Soft, non-tender, bowel sounds active all four quadrants,    no masses, no organomegaly. Visceral obesity - yes  Genitalia:   NOT DONE  Rectal:   NOT DONE  Extremities:   Extremities - normal, Has Cane - no, Clubbing - no, Edema - no  Pulses:   2+ and symmetric all extremities  Skin:   Stigmata of Connective Tissue Disease - no  Lymph nodes:   Cervical, supraclavicular, and axillary nodes normal  Psychiatric:  Neurologic:   Pleasant - yes, Anxious - no, Flat affect - no  CAm-ICU - neg, Alert and Oriented x 3 - yes, Moves all 4s - yes, Speech - normal, Cognition - intact  Assessment:       ICD-10-CM   1. Dyspnea on exertion  R06.00 POCT EXHALED NITRIC OXIDE    Pulmonary function test    IgE    CBC w/Diff    Basic Metabolic Panel (BMET)  2. Chronic cough  R05 POCT EXHALED NITRIC OXIDE    Pulmonary function test    IgE    CBC w/Diff     Basic Metabolic Panel (BMET)  3. History of wheezing  Z87.898 POCT EXHALED NITRIC OXIDE    Pulmonary function test    IgE    CBC w/Diff    Basic Metabolic Panel (BMET)  4. History of anemia  Z86.2 POCT EXHALED NITRIC OXIDE    Pulmonary function test    IgE    CBC w/Diff    Basic Metabolic Panel (BMET)  5. Chronic kidney disease, unspecified CKD stage  N18.9 POCT EXHALED NITRIC OXIDE    Pulmonary function test    IgE    CBC w/Diff    Basic Metabolic Panel (BMET)  6. Shortness of breath  R06.02 CT Chest High Resolution    POCT EXHALED NITRIC OXIDE    Pulmonary function test    IgE    CBC w/Diff    Basic Metabolic Panel (BMET)       Plan:     Patient Instructions     ICD-10-CM   1. Dyspnea on exertion  R06.00   2. Chronic cough  R05   3. History of wheezing  Z87.898   4. History of anemia  Z86.2   5. Chronic kidney disease, unspecified CKD stage  N18.9     Shortness of breath remains unexplained.  Common causes a visceral obesity, diastolic dysfunction or heart muscle stiffness, physical deconditioning.  We need to rule out any asthma or pulmonary fibrosis or other lung diseases  Plan - Do HRCT supine and prone -Do exhaled nitric oxide testing if it is available in our office at the time of your next visit for breathing test -Do full pulmonary function test -Do blood IgE, CBC with differential and chemistry panel today blood work   Followup  - next few to several weeks with me or app to discuss test result  - if CT shows fibrosis (doubt) you need additional blood work and Licensed conveyancer and we will inform you about it     SIGNATURE    Dr. Brand Males, M.D., F.C.C.P,  Pulmonary and Critical Care Medicine Staff Physician, [facility] Director - Interstitial Lung Disease  Program  Pulmonary [place] [facility] [place], [place], [id number]  Pager: [phone], If no answer or between  15:00h - 7:00h: call  [phone] Telephone: [phone]  9:28 AM 04/11/2020

## 2020-04-12 LAB — IGE: IgE (Immunoglobulin E), Serum: 56 kU/L (ref <=114)

## 2020-04-12 NOTE — Progress Notes (Signed)
Blood IgE and basic labs ok. No anemia and kidneys stable. Will discuss at followup

## 2020-04-22 ENCOUNTER — Other Ambulatory Visit: Payer: Self-pay

## 2020-04-22 ENCOUNTER — Ambulatory Visit (INDEPENDENT_AMBULATORY_CARE_PROVIDER_SITE_OTHER)
Admission: RE | Admit: 2020-04-22 | Discharge: 2020-04-22 | Disposition: A | Discharge status: Home / Self Care | Payer: Medicare Other | Source: Ambulatory Visit | Attending: Internal Medicine | Admitting: Internal Medicine

## 2020-04-22 DIAGNOSIS — R0602 Shortness of breath: Secondary | ICD-10-CM

## 2020-04-22 DIAGNOSIS — R0600 Dyspnea, unspecified: Secondary | ICD-10-CM | POA: Diagnosis not present

## 2020-04-22 DIAGNOSIS — R0609 Other forms of dyspnea: Secondary | ICD-10-CM

## 2020-04-22 DIAGNOSIS — R05 Cough: Secondary | ICD-10-CM | POA: Diagnosis not present

## 2020-04-22 DIAGNOSIS — Z87898 Personal history of other specified conditions: Secondary | ICD-10-CM

## 2020-04-22 DIAGNOSIS — Z862 Personal history of diseases of the blood and blood-forming organs and certain disorders involving the immune mechanism: Secondary | ICD-10-CM

## 2020-04-22 DIAGNOSIS — N189 Chronic kidney disease, unspecified: Secondary | ICD-10-CM

## 2020-04-22 DIAGNOSIS — R053 Chronic cough: Secondary | ICD-10-CM

## 2020-04-24 ENCOUNTER — Telehealth: Payer: Self-pay | Admitting: Internal Medicine

## 2020-04-24 DIAGNOSIS — K7460 Unspecified cirrhosis of liver: Secondary | ICD-10-CM

## 2020-04-24 NOTE — Telephone Encounter (Signed)
Called and spoke with pt letting him know the results of the CT and that MR wanted [place] to order [place] of liver. Pt verbalized understanding. Order has been placed. Nothing further needed.

## 2020-04-24 NOTE — Telephone Encounter (Signed)
Pls let Scott [name]  Know that while lungs are normal except for "air trapping" which I will discuss at followup -> his liver features suggest a condition called cirrhosis  Plan  - get RUQ ULTRASOUND - between now and followup visit in Oct  Thanks  MR   CT Chest High Resolution  Result Date: 04/22/2020 CLINICAL DATA:  Increased shortness of breath on exertion, wheezing and chronic cough. EXAM: CT CHEST WITHOUT CONTRAST TECHNIQUE: Multidetector CT imaging of the chest was performed following the standard protocol without intravenous contrast. High resolution imaging of the lungs, as well as inspiratory and expiratory imaging, was performed. COMPARISON:  01/30/2016. FINDINGS: Cardiovascular: Right-sided aortic arch. Atherosclerotic calcification of the aorta, aortic valve and coronary arteries. Heart is enlarged. Small amount of pericardial fluid is unchanged. Mediastinum/Nodes: No pathologically enlarged mediastinal or axillary lymph nodes. Hilar regions are difficult to definitively evaluate without IV contrast. There are calcified right hilar lymph nodes. Esophagus is grossly unremarkable. Lungs/Pleura: Image quality is degraded by respiratory motion. Negative for subpleural reticulation, traction bronchiectasis/bronchiolectasis, ground-glass, architectural distortion or honeycombing. Trace right pleural fluid. Airway is unremarkable. There is air trapping. Upper Abdomen: Liver margin is irregular. Stones layer in the gallbladder. Adrenal glands are unremarkable. Low-attenuation lesions in the kidneys measure up to 3.2 cm on the right, incompletely imaged. Visualized portions of the spleen, pancreas, stomach and bowel are unremarkable with the exception of a small hiatal hernia. Musculoskeletal: Degenerative changes in the spine and shoulders. No worrisome lytic or sclerotic lesions. Old rib fractures. IMPRESSION: 1. Negative for interstitial lung disease. Air trapping is indicative of small airways  disease. 2. Trace right pleural effusion. 3. Cirrhosis. 4. Cholelithiasis. 5. Aortic atherosclerosis (ICD10-I70.0). Coronary artery calcification. Electronically Signed   By: Lorin Picket M.D.   On: 04/22/2020 12:06

## 2020-05-01 ENCOUNTER — Ambulatory Visit
Admission: RE | Admit: 2020-05-01 | Discharge: 2020-05-01 | Disposition: A | Discharge status: Home / Self Care | Payer: Medicare Other | Source: Ambulatory Visit | Attending: Internal Medicine | Admitting: Internal Medicine

## 2020-05-01 DIAGNOSIS — K7460 Unspecified cirrhosis of liver: Secondary | ICD-10-CM

## 2020-05-14 ENCOUNTER — Ambulatory Visit: Payer: Medicare Other | Attending: Internal Medicine

## 2020-05-14 DIAGNOSIS — Z23 Encounter for immunization: Secondary | ICD-10-CM

## 2020-05-14 NOTE — Progress Notes (Signed)
   Covid-19 Vaccination Clinic  Name:  RITHWIK SCHMIEG    MRN: [id number] DOB: 1936-03-26  05/14/2020  Mr. Hoglund was observed post Covid-19 immunization for 15 minutes without incident. He was provided with Vaccine Information Sheet and instruction to access the V-Safe system.   Mr. Feagans was instructed to call 911 with any severe reactions post vaccine: Marland Kitchen Difficulty breathing  . Swelling of face and throat  . A fast heartbeat  . A bad rash all over body  . Dizziness and weakness

## 2020-05-15 ENCOUNTER — Ambulatory Visit (INDEPENDENT_AMBULATORY_CARE_PROVIDER_SITE_OTHER): Payer: Medicare Other | Admitting: Internal Medicine

## 2020-05-15 ENCOUNTER — Telehealth: Payer: Self-pay | Admitting: Internal Medicine

## 2020-05-15 ENCOUNTER — Encounter: Payer: Self-pay | Admitting: Internal Medicine

## 2020-05-15 ENCOUNTER — Other Ambulatory Visit: Payer: Self-pay

## 2020-05-15 VITALS — BP 128/80 | HR 74 | Temp 97.3°F | Ht 65.5 in | Wt 167.2 lb

## 2020-05-15 DIAGNOSIS — Z87898 Personal history of other specified conditions: Secondary | ICD-10-CM

## 2020-05-15 DIAGNOSIS — K7460 Unspecified cirrhosis of liver: Secondary | ICD-10-CM

## 2020-05-15 DIAGNOSIS — N189 Chronic kidney disease, unspecified: Secondary | ICD-10-CM

## 2020-05-15 DIAGNOSIS — R0600 Dyspnea, unspecified: Secondary | ICD-10-CM | POA: Diagnosis not present

## 2020-05-15 DIAGNOSIS — R053 Chronic cough: Secondary | ICD-10-CM

## 2020-05-15 DIAGNOSIS — R0609 Other forms of dyspnea: Secondary | ICD-10-CM

## 2020-05-15 DIAGNOSIS — R0602 Shortness of breath: Secondary | ICD-10-CM

## 2020-05-15 DIAGNOSIS — Z862 Personal history of diseases of the blood and blood-forming organs and certain disorders involving the immune mechanism: Secondary | ICD-10-CM

## 2020-05-15 LAB — PULMONARY FUNCTION TEST
DL/VA % pred: 114 %
DL/VA: 4.49 ml/min/mmHg/L
DLCO cor % pred: 114 %
DLCO cor: 23.87 ml/min/mmHg
DLCO unc % pred: 110 %
DLCO unc: 23.17 ml/min/mmHg
FEF 25-75 Post: 3.76 L/sec
FEF 25-75 Pre: 2.66 L/sec
FEF2575-%Change-Post: 41 %
FEF2575-%Pred-Post: 256 %
FEF2575-%Pred-Pre: 181 %
FEV1-%Change-Post: 7 %
FEV1-%Pred-Post: 123 %
FEV1-%Pred-Pre: 114 %
FEV1-Post: 2.75 L
FEV1-Pre: 2.56 L
FEV1FVC-%Change-Post: 5 %
FEV1FVC-%Pred-Pre: 114 %
FEV6-%Change-Post: 1 %
FEV6-%Pred-Post: 107 %
FEV6-%Pred-Pre: 105 %
FEV6-Post: 3.18 L
FEV6-Pre: 3.13 L
FEV6FVC-%Change-Post: 0 %
FEV6FVC-%Pred-Post: 108 %
FEV6FVC-%Pred-Pre: 108 %
FVC-%Change-Post: 1 %
FVC-%Pred-Post: 99 %
FVC-%Pred-Pre: 97 %
FVC-Post: 3.18 L
FVC-Pre: 3.14 L
Post FEV1/FVC ratio: 87 %
Post FEV6/FVC ratio: 100 %
Pre FEV1/FVC ratio: 82 %
Pre FEV6/FVC Ratio: 100 %
RV % pred: 101 %
RV: 2.5 L
TLC % pred: 94 %
TLC: 5.83 L

## 2020-05-15 MED ORDER — AIRDUO DIGIHALER 113-14 MCG/ACT IN AEPB: 1.0000 | INHALATION_SPRAY | Freq: Two times a day (BID) | RESPIRATORY_TRACT | 0 refills | Status: DC

## 2020-05-15 NOTE — Telephone Encounter (Signed)
CT faxed over to Dr. Noel Journey office at [phone]. Nothing further needed at this time.

## 2020-05-15 NOTE — Patient Instructions (Addendum)
Dyspnea on exertion History of wheezing  -Overall test work-up is normal.  The only thing would be slight air trapping seen on CT scan of the chest and suggested on the pulmonary function test although not definitive  -Therefore overall I think shortness of breath is related to aging, weight and slightly stiff heart muscle -I do not know what to make of the small trace right pleural effusion  Plan -Try air duo sample Digihaler -low-dose -1 puff twice daily  -Call us back in a few weeks it is helping you I will send in a prescription  -We will keep a clinical eye on the trace pleural effusion  Hepatic cirrhosis, unspecified hepatic cirrhosis type, unspecified whether ascites present ([place]) -seen on CT scan of the chest but not ultrasound.  Asymptomatic gallstones  -This was seen in the CT scan of the chest May 09, 2020  Plan  - please take this up with the primary care physician  Follow-up -6 months or sooner if needed

## 2020-05-15 NOTE — Progress Notes (Signed)
OV 04/11/2020  Subjective:  Patient ID: [name], male , DOB: November 28, 1935 , age 84 y.o. , MRN: [id number] , ADDRESS: [place] [id number]  PCP   Jani Gravel, MD  04/11/2020 -   Chief Complaint  Patient presents with  . Consult    DOE     HPI Johncharles Fusselman Philley 84 y.o. -referred by for shortness of breath on exertion reli relieved by rest.  He is here at the request of his wife.  Patient is well-known to me.  I I take care of his wife.  He tells me for the last several years he has had insidious onset of shortness of breath with exertion.  Gradually and steadily getting worse.  Dyspnea is rated as a 5 out of 10.  He has associated cough that is rated as a 3 out of 10 and wheezing that is rated as 1 out of 10.  He is associated heavy seasonal allergies that is actually perennial now.  He says when he stops his antihistamines his symptoms come back pretty rapidly.  Nevertheless shortness of breath is experienced when he does grocery shopping or climbs 2 flights of stairs.  He does not have symptoms of one flight of stairs.  When he does grocery shopping his knees bother him more than shortness of breath but when he climbs stairs 2 flights shortness of breath bothers him more than the knees.  There is no associated chest pain.  He had a cardiac stress test this year and it is normal and documented below.  His last CT scan of the chest was in 2017 where he had some pleural effusions.  Of note his shortness of breath is progressive but his cough and wheezing is static  Lab review shows mild anemia in 2017 but no further labs with [place]  He also has mild chronic kidney disease last checked within our health system in 2017.  In 2017 CT chest did show small pericardial effusion  His most recent echocardiogram in June 2021 shows grade 1 diastolic dysfunction.  He has associated visceral obesity.  There is no orthopnea.  He has some mild chronic venous stasis edema particularly on the left side.   There is no change to this.  There is no hemoptysis.   He has never had allergy tests  Simple office walk 185 feet x  3 laps goal with forehead probe 04/11/2020   O2 used ra  Number laps completed 3  Comments about pace avg  Resting Pulse Ox/HR 98% and 84/min  Final Pulse Ox/HR 98% and 82/min  Desaturated </= 88% no  Desaturated <= 3% points no  Got Tachycardic >/= 90/min no  Symptoms at end of test No dyspnea  Miscellaneous comments x      CT chest June 2017  IMPRESSION: Small bilateral pleural effusions and mild atelectasis/scarring over the lingula and left lower lobe.  Small pericardial effusion.  Three vessel atherosclerotic coronary artery disease. Aortic atherosclerosis.  Right-sided aortic arch with aberrant left subclavian artery.   Electronically Signed   By: Marin Olp M.D.   On: 01/30/2016 15:56   ROS - per HPI  Results for Venditto, YURIEL LOPEZMARTINEZ "JOE" (MRN [id number]) as of 04/11/2020 09:01  Ref. Range 06/30/2016 11:38 07/10/2016 14:18 07/11/2016 06:12 07/12/2016 03:44 07/13/2016 03:13  Hemoglobin Latest Ref Range: 13.0 - 17.0 g/dL 13.9 13.5 12.3 (L) 11.9 (L) 11.1 (L)  Results for Creekmore, ROSE HIPPLER" (MRN [id number]) as of 04/11/2020  09:01  Ref. Range 01/31/2016 05:58 06/30/2016 11:38 07/10/2016 14:18 07/11/2016 06:12 07/13/2016 03:13  Creatinine Latest Ref Range: 0.61 - 1.24 mg/dL 1.32 (H) 1.11 1.18 1.26 (H) 1.31 (H)    Echocardiogram February 07, 2020  Echocardiogram 02/07/2020:  Normal LV systolic function with EF 59%. Left ventricle cavity is normal  in size. Mild concentric hypertrophy of the left ventricle. Normal global  wall motion. Doppler evidence of grade I (impaired) diastolic dysfunction,  normal LAP. Calculated EF 59%.  Left atrial cavity is moderately dilated.  Structurally normal mitral valve. Mild (Grade I) mitral regurgitation.  Pericardium is normal. Insignificant pericardial effusion with clear  fluids.  IVC is dilated with respiratory  variation. This may suggest elevated right  heart pressure  No significant change from 05/09/2015.  Lexiscan/modified Bruce Tetrofosmin stress test 02/14/2020: Lexiscan/modified Bruce nuclear stress test performed using 1-day protocol. Stress EKG is non-diagnostic, as this is pharmacological stress test. In addition, stress EKG at 110% MPHR showed sinus tachycardia, old inferior infarct. Mild decrease in reduced radiotracer update in inferior myocardium at rest and stress. In setting of normal myocardial thickening, this likely represents tissue attenuation artifact. Stress LVEF 77%. Low risk study.   OV 05/15/2020  Subjective:  Patient ID: [name], male , DOB: 1936/04/06 , age 84 y.o. , MRN: [id number] , ADDRESS: [address] [place] [address] PCP Jani Gravel, MD Relevant Other Providers: - [name] This Provider for this visit: Treatment Team:  Attending Provider: Brand Males, MD    05/15/2020 -   Chief Complaint  Patient presents with  . Follow-up    CT scan 9/13, PFT Today wants to talk about results of both, breathing the same as last visit shortness of breath with exertion     HPI Henok Heacock Schleich 84 y.o. -here to discuss test results.  Pulmonary function test essentially normal..  CT scan shows cirrhosis with trace pleural effusion and some air trapping but otherwise normal.  He is not aware of the fact that he has cirrhosis.  This was followed up with a right upper quadrant ultrasound that did not show cirrhosis but does asymptomatic gallstones he denies any drinking.  There is no interstitial lung disease or pneumonia or cancer.  However he still has symptoms.  At this point in time his symptoms are minimal.  He states that his main priority is to make sure there is nothing acutely abnormal with his lungs explain his symptoms.  He is willing to try an empiric inhaler    PFT Results Latest Ref Rng & Units 05/15/2020  FVC-Pre L 3.14  FVC-Predicted Pre % 97  FVC-Post L  3.18  FVC-Predicted Post % 99  Pre FEV1/FVC % % 82  Post FEV1/FCV % % 87  FEV1-Pre L 2.56  FEV1-Predicted Pre % 114  FEV1-Post L 2.75  DLCO uncorrected ml/min/mmHg 23.17  DLCO UNC% % 110  DLCO corrected ml/min/mmHg 23.87  DLCO COR %Predicted % 114  DLVA Predicted % 114  TLC L 5.83  TLC % Predicted % 94  RV % Predicted % 101   No results found for: NITRICOXIDE   CLINICAL DATA:  Increased shortness of breath on exertion, wheezing and chronic cough.  EXAM: CT CHEST WITHOUT CONTRAST  TECHNIQUE: Multidetector CT imaging of the chest was performed following the standard protocol without intravenous contrast. High resolution imaging of the lungs, as well as inspiratory and expiratory imaging, was performed.  COMPARISON:  01/30/2016.  FINDINGS: Cardiovascular: Right-sided aortic arch. Atherosclerotic calcification  of the aorta, aortic valve and coronary arteries. Heart is enlarged. Small amount of pericardial fluid is unchanged.  Mediastinum/Nodes: No pathologically enlarged mediastinal or axillary lymph nodes. Hilar regions are difficult to definitively evaluate without IV contrast. There are calcified right hilar lymph nodes. Esophagus is grossly unremarkable.  Lungs/Pleura: Image quality is degraded by respiratory motion. Negative for subpleural reticulation, traction bronchiectasis/bronchiolectasis, ground-glass, architectural distortion or honeycombing. Trace right pleural fluid. Airway is unremarkable. There is air trapping.  Upper Abdomen: Liver margin is irregular. Stones layer in the gallbladder. Adrenal glands are unremarkable. Low-attenuation lesions in the kidneys measure up to 3.2 cm on the right, incompletely imaged. Visualized portions of the spleen, pancreas, stomach and bowel are unremarkable with the exception of a small hiatal hernia.  Musculoskeletal: Degenerative changes in the spine and shoulders. No worrisome lytic or sclerotic lesions.  Old rib fractures.  IMPRESSION: 1. Negative for interstitial lung disease. Air trapping is indicative of small airways disease. 2. Trace right pleural effusion. 3. Cirrhosis. 4. Cholelithiasis. 5. Aortic atherosclerosis (ICD10-I70.0). Coronary artery calcification.   Electronically Signed   By: Lorin Picket M.D.   On: 04/22/2020 12:06  IMPRESSION: Cholelithiasis without evidence of cholecystitis. No other abnormality seen in the right upper quadrant of the abdomen.   Electronically Signed   By: Marijo Conception M.D.   On: 05/01/2020 16:11 ROS - per HPI  Results for Gains, AUBRA PAPPALARDO "JOE" (MRN [id number]) as of 05/15/2020 10:50  Ref. Range 04/11/2020 10:02  IgE (Immunoglobulin E), Serum Latest Ref Range: <OR=114 kU/L 56  Results for Subramanian, JUDA TOEPFER" (MRN [id number]) as of 05/15/2020 10:50  Ref. Range 04/11/2020 10:02  Sodium Latest Ref Range: 135 - 145 mEq/L 139  Potassium Latest Ref Range: 3.5 - 5.1 mEq/L 4.3  Chloride Latest Ref Range: 96 - 112 mEq/L 107  CO2 Latest Ref Range: 19 - 32 mEq/L 25  Glucose Latest Ref Range: 70 - 99 mg/dL 132 (H)  BUN Latest Ref Range: 6 - 23 mg/dL 27 (H)  Creatinine Latest Ref Range: 0.40 - 1.50 mg/dL 1.25  Calcium Latest Ref Range: 8.4 - 10.5 mg/dL 9.1  GFR Latest Ref Range: >60.00 mL/min 55.23 (L)   Results for Elicker, TONYA CARLILE" (MRN [id number]) as of 05/15/2020 10:50  Ref. Range 04/11/2020 10:02  Eosinophils Absolute Latest Ref Range: 0 - 0 K/uL 0.2  Results for Mariner, THOMS BARTHELEMY" (MRN [id number]) as of 05/15/2020 10:50  Ref. Range 04/11/2020 10:02  Hemoglobin Latest Ref Range: 13.0 - 17.0 g/dL 13.6    has a past medical history of Acid reflux, Adenomatous colon polyp (01/2002), Arthritis (2004), Coronary artery disease (2007), Enlarged prostate, Hyperlipidemia, Hypertension, Myocardial infarction ([place]) (2007), Neuromuscular disorder ([place]), Prostate cancer ([place]) (2013), Sepsis secondary to UTI [facility]), Shortness of breath dyspnea, Sleep apnea  (2001), and Venous insufficiency.   reports that he has never smoked. He has never used smokeless tobacco.  Past Surgical History:  Procedure Laterality Date  . ANGIOPLASTY  09/18/2005   1 stent placed  . APPENDECTOMY  1989  . PROSTATE BIOPSY  12/30/2000  . ROTATOR CUFF REPAIR  2004   right  . TONSILLECTOMY AND ADENOIDECTOMY  1950  . TOTAL KNEE ARTHROPLASTY Left 07/10/2016   Procedure: TOTAL KNEE ARTHROPLASTY;  Surgeon: Netta Cedars, MD;  Location: [facility] [place];  Service: Orthopedics;  Laterality: Left;  Marland Kitchen VASECTOMY  1970    Allergies  Allergen Reactions  . Shellfish Allergy Nausea And Vomiting  . Sulfa Antibiotics Other (See Comments)  Feels fatigued and weak when taking sulfa for long durations     Immunization History  Administered Date(s) Administered  . PFIZER SARS-COV-2 Vaccination 09/01/2019, 09/22/2019, 05/14/2020    Family History  Problem Relation Age of Onset  . Prostate cancer Brother   . Diabetes Father   . Heart disease Father   . Diabetes Sister   . Diabetes Brother   . Heart disease Mother   . Diabetes Son      Current Outpatient Medications:  .  alfuzosin (UROXATRAL) 10 MG 24 hr tablet, Take 10 mg by mouth at bedtime., Disp: , Rfl:  .  aspirin 81 MG tablet, Take 81 mg by mouth at bedtime. , Disp: , Rfl:  .  atorvastatin (LIPITOR) 40 MG tablet, Take 40 mg by mouth every evening., Disp: , Rfl:  .  cetirizine (ZYRTEC) 10 MG tablet, Take 10 mg by mouth daily before breakfast. , Disp: , Rfl:  .  eplerenone (INSPRA) 25 MG tablet, TAKE 1 TABLET BY MOUTH IN  THE MORNING, Disp: 90 tablet, Rfl: 3 .  ezetimibe (ZETIA) 10 MG tablet, Take 1 tablet (10 mg total) by mouth daily after supper., Disp: 90 tablet, Rfl: 3 .  ferrous sulfate 325 (65 FE) MG EC tablet, Take 325 mg by mouth every evening. , Disp: , Rfl:  .  finasteride (PROSCAR) 5 MG tablet, Take 5 mg by mouth every evening. , Disp: , Rfl:  .  fluticasone (FLONASE) 50 MCG/ACT nasal spray, Place into both nostrils  daily., Disp: , Rfl:  .  gabapentin (NEURONTIN) 300 MG capsule, Take 300 mg by mouth 2 (two) times daily., Disp: , Rfl:  .  montelukast (SINGULAIR) 10 MG tablet, Take 10 mg by mouth every morning., Disp: , Rfl:  .  Multiple Vitamin (MULTIVITAMIN) tablet, Take 1 tablet by mouth at bedtime. , Disp: , Rfl:  .  oxybutynin (DITROPAN) 5 MG tablet, Take 5 mg by mouth 2 (two) times daily., Disp: , Rfl:  .  pantoprazole (PROTONIX) 20 MG tablet, Take 40 mg by mouth daily before breakfast. , Disp: , Rfl:  .  vitamin C (ASCORBIC ACID) 500 MG tablet, Take 500 mg by mouth 2 (two) times daily., Disp: , Rfl:  .  Fluticasone-Salmeterol,sensor, (AIRDUO DIGIHALER) 113-14 MCG/ACT AEPB, Inhale 1 puff into the lungs in the morning and at bedtime., Disp: 1 each, Rfl: 0      Objective:   Vitals:   05/15/20 1027  BP: 128/80  Pulse: 74  Temp: (!) 97.3 F (36.3 C)  TempSrc: Temporal  SpO2: 97%  Weight: 167 lb 3.2 oz (75.8 kg)  Height: 5' 5.5" (1.664 m)    Estimated body mass index is 27.4 kg/m as calculated from the following:   Height as of this encounter: 5' 5.5" (1.664 m).   Weight as of this encounter: 167 lb 3.2 oz (75.8 kg).  @WEIGHTCHANGE @  [employer]   05/15/20 1027  Weight: 167 lb 3.2 oz (75.8 kg)     Physical Exam  Discussion only visit      Assessment:       ICD-10-CM   1. Dyspnea on exertion  R06.00   2. History of wheezing  Z87.898   3. Hepatic cirrhosis, unspecified hepatic cirrhosis type, unspecified whether ascites present ([place])  K74.60        Plan:     Patient Instructions  Dyspnea on exertion History of wheezing  -Overall test work-up is normal.  The only thing would be slight air trapping  seen on CT scan of the chest and suggested on the pulmonary function test although not definitive  -Therefore overall I think shortness of breath is related to aging, weight and slightly stiff heart muscle -I do not know what to make of the small trace right pleural  effusion  Plan -Try air duo sample Digihaler -low-dose -1 puff twice daily  -Call us back in a few weeks it is helping you I will send in a prescription  -We will keep a clinical eye on the trace pleural effusion  Hepatic cirrhosis, unspecified hepatic cirrhosis type, unspecified whether ascites present [facility])  -This was seen in the CT scan of the chest May 09, 2020  Plan  - please take this up with the primary care physician  Follow-up -6 months or sooner if needed  ( Level 03: Esbt 20-29 min n visit spent in visit type: on-site physical face to visit in total care time and counseling or/and coordination of care by this undersigned MD - Dr Brand Males. This includes one or more of the following all delivered on this same day 05/15/2020: pre-charting, chart review, note writing, documentation discussion of test results, diagnostic or treatment recommendations, prognosis, risks and benefits of management options, instructions, education, compliance or risk-factor reduction. It excludes time spent by the [place] or office staff in the care of the patient. Actual time was 72)    SIGNATURE    Dr. Brand Males, M.D., F.C.C.P,  Pulmonary and Critical Care Medicine Staff Physician, [place] Director - Interstitial Lung Disease  Program  P[facility] [place] [facility] [place], [place], [address]  Pager: [phone], If no answer or between  15:00h - 7:00h: call [phone] Telephone: [phone]  11:04 AM 05/15/2020

## 2020-05-15 NOTE — Progress Notes (Signed)
PFT done today. 

## 2020-05-23 ENCOUNTER — Ambulatory Visit: Payer: Medicare Other | Admitting: Cardiology

## 2020-06-17 ENCOUNTER — Telehealth: Payer: Self-pay | Admitting: Internal Medicine

## 2020-06-17 MED ORDER — AIRDUO DIGIHALER 113-14 MCG/ACT IN AEPB: 1.0000 | INHALATION_SPRAY | Freq: Two times a day (BID) | RESPIRATORY_TRACT | 4 refills | Status: DC

## 2020-06-17 NOTE — Telephone Encounter (Signed)
Called and spoke with patient, he states the airduo sample is working well for him and is requesting a prescription be sent to his pharmacy, [facility] month supply to [facility].  I let him know that I would send it in with refills.  He verbalized understanding.  Nothing further needed.

## 2020-06-24 ENCOUNTER — Telehealth: Payer: Self-pay | Admitting: Internal Medicine

## 2020-06-24 MED ORDER — AIRDUO DIGIHALER 113-14 MCG/ACT IN AEPB: 1.0000 | INHALATION_SPRAY | Freq: Two times a day (BID) | RESPIRATORY_TRACT | 4 refills | Status: DC

## 2020-06-24 NOTE — Telephone Encounter (Signed)
Called and spoke with pt letting him know that we would send generic Rx of airduo to preferred pharmacy for him. Pt verbalized understanding. Nothing further needed.

## 2020-09-09 ENCOUNTER — Ambulatory Visit: Payer: Medicare Other | Admitting: Cardiology

## 2020-09-09 ENCOUNTER — Encounter: Payer: Self-pay | Admitting: Cardiology

## 2020-09-09 ENCOUNTER — Other Ambulatory Visit: Payer: Self-pay

## 2020-09-09 VITALS — BP 138/76 | HR 83 | Temp 98.3°F | Resp 16 | Ht 65.0 in | Wt 168.8 lb

## 2020-09-09 DIAGNOSIS — R0600 Dyspnea, unspecified: Secondary | ICD-10-CM

## 2020-09-09 DIAGNOSIS — E7800 Pure hypercholesterolemia, unspecified: Secondary | ICD-10-CM

## 2020-09-09 DIAGNOSIS — R0609 Other forms of dyspnea: Secondary | ICD-10-CM

## 2020-09-09 DIAGNOSIS — I2510 Atherosclerotic heart disease of native coronary artery without angina pectoris: Secondary | ICD-10-CM

## 2020-09-09 DIAGNOSIS — I10 Essential (primary) hypertension: Secondary | ICD-10-CM

## 2020-09-09 NOTE — Progress Notes (Signed)
Primary Physician/Referring:  Jani Gravel, MD  Patient ID: Scott [name], male    DOB: 09-07-1935, 85 y.o.   MRN: [id number]  Chief Complaint  Patient presents with  . Coronary Artery Disease    1 year  . Follow-up    1 year   HPI:    Scott [name]  is a 85 y.o. with hypertension, hyperlipidemia, obstructive sleep apnea on CPAP and is compliant, coronary artery disease and h/o PTCA Mid RCA 3.5x20 mm Taxus stent (DES) in 2007 when he presented with AMI while visiting [place].   Except for chronic dyspnea on exertion, no chest pain.  He denies any leg edema, no PND or orthopnea.  Past Medical History:  Diagnosis Date  . Acid reflux    history of Barretts esophagus   . Adenomatous colon polyp 01/2002  . Arthritis 2004  . Coronary artery disease 2007   cardiac cath w/ angioplasty & stent placement x 1  . Enlarged prostate   . Hyperlipidemia   . Hypertension   . Myocardial infarction [facility]) 2007   while vacationing in [place], cath. done there & x1 stent placed   . Neuromuscular disorder ([place])    carpal tunnel - both hands   . Prostate cancer [facility]) 2013   biopsy- active survelliance, annual biopsies   . Sepsis secondary to UTI ([place])   . Shortness of breath dyspnea   . Sleep apnea 2001  . Venous insufficiency    Past Surgical History:  Procedure Laterality Date  . ANGIOPLASTY  09/18/2005   1 stent placed  . APPENDECTOMY  1989  . PROSTATE BIOPSY  12/30/2000  . ROTATOR CUFF REPAIR  2004   right  . TONSILLECTOMY AND ADENOIDECTOMY  1950  . TOTAL KNEE ARTHROPLASTY Left 07/10/2016   Procedure: TOTAL KNEE ARTHROPLASTY;  Surgeon: Netta Cedars, MD;  Location: [facility];  Service: Orthopedics;  Laterality: Left;  Marland Kitchen VASECTOMY  1970   Social History   Tobacco Use  . Smoking status: Never Smoker  . Smokeless tobacco: Never Used  Substance Use Topics  . Alcohol use: No   Marital Status: Married   ROS  Review of Systems  Cardiovascular: Positive for dyspnea on exertion. Negative for chest  pain and leg swelling.  Respiratory: Positive for wheezing (occasaional).   Musculoskeletal: Positive for arthritis and joint pain.  Gastrointestinal: Positive for heartburn (controlled by PPI). Negative for melena.   Objective   Vitals with BMI 09/09/2020 05/15/2020 04/11/2020  Height 5' 5"  5' 5.5" 5' 6"   Weight 168 lbs 13 oz 167 lbs 3 oz 175 lbs  BMI 28.09 77.82 42.35  Systolic 361 443 154  Diastolic 76 80 60  Pulse 83 74 50    Blood pressure 138/76, pulse 83, temperature 98.3 F (36.8 C), temperature source Temporal, resp. rate 16, height 5' 5"  (1.651 m), weight 168 lb 12.8 oz (76.6 kg), SpO2 96 %. Body mass index is 28.09 kg/m.   Physical Exam Constitutional:      Comments: Moderately built and well-nourished in no acute distress.  Eyes:     Conjunctiva/sclera: Conjunctivae normal.  Neck:     Thyroid: No thyromegaly.     Vascular: No JVD.  Cardiovascular:     Rate and Rhythm: Normal rate and regular rhythm.     Pulses: Normal pulses and intact distal pulses.          Carotid pulses are 2+ on the right side and 2+ on the left side.    Heart  sounds: Normal heart sounds. No murmur heard. No gallop.      Comments: No leg edema, no JVD. Pulmonary:     Effort: Pulmonary effort is normal.     Breath sounds: Normal breath sounds.  Abdominal:     General: Bowel sounds are normal.     Palpations: Abdomen is soft.    Laboratory examination:   External Labs:  Lipid Panel     Component Value Date/Time   CHOL 105 03/08/2020 0900   TRIG 114 03/08/2020 0900   HDL 35 (L) 03/08/2020 0900   LDLCALC 49 03/08/2020 0900   LABVLDL 21 03/08/2020 0900    Cholesterol, total 93.000 mg 07/03/2020 HDL 37.000 mg 07/03/2020 LDL 37.000 mg 07/03/2020 Triglycerides 97.000 mg 07/03/2020  A1C 6.400 % 07/03/2020 TSH 1.340 07/03/2020  Hemoglobin 13.600 g/d 04/11/2020 Platelets 157.000 K/ 04/11/2020  Creatinine, Serum 1.150 MG/ 07/03/2020 Potassium 4.300 mEq 04/11/2020 ALT (SGPT) 19.000 IU/  07/03/2020   Cholesterol, total 167.000 M 12/28/2019 Triglycerides 88.000 12/28/2019 HDL 45.000 12/28/2019 LDL-C 81.000 M 12/21/2018  A1C 5.900 % 07/03/2019  Hemoglobin N/D  Creatinine, Serum 1.120 MG/ 12/28/2019 Potassium 4.500 MM 06/02/2016 Magnesium N/D ALT (SGPT) 16.000 IU/ 12/28/2019 TSH 1.410 12/28/2019  Labs 12/21/2018:   HB 13.7/HCT 41.8, platelets 173.  Serum glucose 100 mg, BUN 28, creatinine 1.18, eGFR 58 mL, potassium 4.5.  A1c 6.0%.    Total cholesterol 148, diglycerides 142, HDL 39, LDL 81.  PSA normal at 2.7.  Medications and allergies   Allergies  Allergen Reactions  . Shellfish Allergy Nausea And Vomiting  . Sulfa Antibiotics Other (See Comments)    Feels fatigued and weak when taking sulfa for long durations     Outpatient Medications Prior to Visit  Medication Sig Dispense Refill  . alfuzosin (UROXATRAL) 10 MG 24 hr tablet Take 10 mg by mouth at bedtime.    Marland Kitchen aspirin 81 MG tablet Take 81 mg by mouth at bedtime.     Marland Kitchen atorvastatin (LIPITOR) 40 MG tablet Take 40 mg by mouth every evening.    . benazepril (LOTENSIN) 10 MG tablet Take 10 mg by mouth daily.    . cetirizine (ZYRTEC) 10 MG tablet Take 10 mg by mouth daily before breakfast.     . eplerenone (INSPRA) 25 MG tablet TAKE 1 TABLET BY MOUTH IN  THE MORNING 90 tablet 3  . ezetimibe (ZETIA) 10 MG tablet Take 1 tablet (10 mg total) by mouth daily after supper. 90 tablet 3  . ferrous sulfate 325 (65 FE) MG EC tablet Take 325 mg by mouth every evening.     . finasteride (PROSCAR) 5 MG tablet Take 5 mg by mouth every evening.     . fluticasone (FLONASE) 50 MCG/ACT nasal spray Place into both nostrils daily.    . Fluticasone-Salmeterol,sensor, (AIRDUO DIGIHALER) 113-14 MCG/ACT AEPB Inhale 1 puff into the lungs in the morning and at bedtime. 3 each 4  . gabapentin (NEURONTIN) 300 MG capsule Take 300 mg by mouth 2 (two) times daily.    . montelukast (SINGULAIR) 10 MG tablet Take 10 mg by mouth every morning.    .  Multiple Vitamin (MULTIVITAMIN) tablet Take 1 tablet by mouth at bedtime.     Marland Kitchen oxybutynin (DITROPAN) 5 MG tablet Take 5 mg by mouth 2 (two) times daily.    . pantoprazole (PROTONIX) 20 MG tablet Take 40 mg by mouth daily before breakfast.     . vitamin C (ASCORBIC ACID) 500 MG tablet Take 1,000 mg by mouth  2 (two) times daily.     No facility-administered medications prior to visit.   No orders of the defined types were placed in this encounter.  There are no discontinued medications.     Radiology:  CXR 02/07/2020: Lung volumes are low. No consolidative airspace disease. No pleural effusions. No pneumothorax. No pulmonary nodule or mass noted. Right-sided aortic arch (normal anatomical variant) incidentally noted. Pulmonary vasculature and the cardiomediastinal silhouette are otherwise within normal limits. Aortic atherosclerosis.    Cardiac Studies:   Coronary Angiography H/O Mild MI-Heart cath/stent in 2007 [facility]), Mid RCA 3.5x20 mm Taxus stent (DES).  Lexiscan/modified Bruce Tetrofosmin stress test 02/14/2020: Lexiscan/modified Bruce nuclear stress test performed using 1-day protocol. Stress EKG is non-diagnostic, as this is pharmacological stress test. In addition, stress EKG at 110% MPHR showed sinus tachycardia, old inferior infarct. Mild decrease in reduced radiotracer update in inferior myocardium at rest and stress. In setting of normal myocardial thickening, this likely represents tissue attenuation artifact. Stress LVEF 77%. Low risk study.  Echocardiogram 02/07/2020:  Normal LV systolic function with EF 59%. Left ventricle cavity is normal  in size. Mild concentric hypertrophy of the left ventricle. Normal global  wall motion. Doppler evidence of grade I (impaired) diastolic dysfunction,  normal LAP. Calculated EF 59%.  Left atrial cavity is moderately dilated.  Structurally normal mitral valve.  Mild (Grade I) mitral regurgitation.  Pericardium is normal.  Insignificant pericardial effusion with clear  fluids.  IVC is dilated with respiratory variation. This may suggest elevated right  heart pressure  No significant change from 05/09/2015.   EKG:  EKG 09/09/2020: Normal sinus rhythm at rate of 67 bpm, left atrial enlargement, inferior infarct old.  Anteroseptal infarct old.  Nonspecific T abnormality. No significant change from EKG 02/01/2020, 05/24/2019.  Assessment     ICD-10-CM   1. Coronary artery disease involving native coronary artery of native heart without angina pectoris  I25.10 EKG 12-Lead  2. Essential hypertension  I10   3. Hypercholesteremia  E78.00   4. Dyspnea on exertion  R06.00    No orders of the defined types were placed in this encounter.  There are no discontinued medications.  Orders Placed This Encounter  Procedures  . EKG 12-Lead    Recommendations:   Scott [name]  is a 85 y.o. with hypertension, hyperlipidemia, obstructive sleep apnea on CPAP and is compliant, coronary artery disease and h/o PTCA Mid RCA 3.5x20 mm Taxus stent (DES) in 2007 when he presented with AMI while visiting [place]. Except for chronic dyspnea on exertion no new symptomatology.  He has not had any recurrence of angina pectoris.  Blood pressures well controlled, I reviewed his external labs, lipids are also under excellent control.  EKG is unchanged from prior EKG.  No changes were done today, he is now trying to lose weight and has lost about 7 pounds since last office visit 6 months ago.  Encouraged him to continue to remain active.  I will see him back on annual basis.    Adrian Prows, MD, [facility] 09/09/2020, 11:31 AM Office: [phone]

## 2020-09-10 ENCOUNTER — Ambulatory Visit: Payer: Medicare Other | Admitting: Cardiology

## 2020-09-19 ENCOUNTER — Ambulatory Visit: Payer: Medicare Other | Admitting: Cardiology

## 2020-11-13 ENCOUNTER — Telehealth: Payer: Self-pay | Admitting: Internal Medicine

## 2020-11-13 NOTE — Telephone Encounter (Signed)
PA request was received from (pharmacy): [facility] Phone: [phone] Fax:  Medication name and strength: AirDuo [id number] Ordering Provider: MR  Was PA started with CMM?: Yes If yes, please enter KEY: [id number]  Medication tried and failed:  Covered Alternatives:   PA was instantly approved until 08/09/2021. Pharmacy is aware of approval. Nothing further needed.

## 2021-01-15 ENCOUNTER — Other Ambulatory Visit: Payer: Self-pay | Admitting: Cardiology

## 2021-01-15 DIAGNOSIS — I482 Chronic atrial fibrillation, unspecified: Secondary | ICD-10-CM

## 2021-02-25 ENCOUNTER — Other Ambulatory Visit: Payer: Self-pay | Admitting: Cardiology

## 2021-02-25 DIAGNOSIS — E7800 Pure hypercholesterolemia, unspecified: Secondary | ICD-10-CM

## 2021-02-26 NOTE — Telephone Encounter (Signed)
Dr. Elsworth Soho, Please see message regarding Advair inhaler.  Please advise if ok to send script to pharmacy, if so, please advise dose.  Thank you.

## 2021-02-27 MED ORDER — ADVAIR HFA 115-21 MCG/ACT IN AERO: 2.0000 | INHALATION_SPRAY | Freq: Two times a day (BID) | RESPIRATORY_TRACT | 2 refills | Status: DC

## 2021-03-24 ENCOUNTER — Ambulatory Visit (INDEPENDENT_AMBULATORY_CARE_PROVIDER_SITE_OTHER): Payer: Medicare Other

## 2021-03-24 ENCOUNTER — Encounter: Payer: Self-pay | Admitting: Internal Medicine

## 2021-03-24 ENCOUNTER — Other Ambulatory Visit: Payer: Self-pay

## 2021-03-24 ENCOUNTER — Ambulatory Visit (INDEPENDENT_AMBULATORY_CARE_PROVIDER_SITE_OTHER): Payer: Medicare Other | Admitting: Internal Medicine

## 2021-03-24 VITALS — BP 128/64 | HR 50 | Temp 98.4°F | Ht 66.0 in | Wt 168.4 lb

## 2021-03-24 DIAGNOSIS — R0989 Other specified symptoms and signs involving the circulatory and respiratory systems: Secondary | ICD-10-CM | POA: Diagnosis not present

## 2021-03-24 DIAGNOSIS — Z87898 Personal history of other specified conditions: Secondary | ICD-10-CM

## 2021-03-24 DIAGNOSIS — R053 Chronic cough: Secondary | ICD-10-CM | POA: Diagnosis not present

## 2021-03-24 DIAGNOSIS — R0609 Other forms of dyspnea: Secondary | ICD-10-CM

## 2021-03-24 DIAGNOSIS — R0600 Dyspnea, unspecified: Secondary | ICD-10-CM

## 2021-03-24 NOTE — Progress Notes (Signed)
OV 04/11/2020  Subjective:  Patient ID: Scott [name], male , DOB: 1936/04/24 , age 85 y.o. , MRN: [id number] , ADDRESS: [place] [id number]  PCP   Scott Gravel, MD  04/11/2020 -   Chief Complaint  Patient presents with   Consult    DOE     HPI Scott [name] 85 y.o. -referred by for shortness of breath on exertion reli relieved by rest.  He is here at the request of his wife.  Patient is well-known to me.  I I take care of his wife.  He tells me for the last several years he has had insidious onset of shortness of breath with exertion.  Gradually and steadily getting worse.  Dyspnea is rated as a 5 out of 10.  He has associated cough that is rated as a 3 out of 10 and wheezing that is rated as 1 out of 10.  He is associated heavy seasonal allergies that is actually perennial now.  He says when he stops his antihistamines his symptoms come back pretty rapidly.  Nevertheless shortness of breath is experienced when he does grocery shopping or climbs 2 flights of stairs.  He does not have symptoms of one flight of stairs.  When he does grocery shopping his knees bother him more than shortness of breath but when he climbs stairs 2 flights shortness of breath bothers him more than the knees.  There is no associated chest pain.  He had a cardiac stress test this year and it is normal and documented below.  His last CT scan of the chest was in 2017 where he had some pleural effusions.  Of note his shortness of breath is progressive but his cough and wheezing is static  Lab review shows mild anemia in 2017 but no further labs with [place]  He also has mild chronic kidney disease last checked within our health system in 2017.  In 2017 CT chest did show small pericardial effusion  His most recent echocardiogram in June 2021 shows grade 1 diastolic dysfunction.  He has associated visceral obesity.  There is no orthopnea.  He has some mild chronic venous stasis edema particularly on the left side.   There is no change to this.  There is no hemoptysis.   He has never had allergy tests  Simple office walk 185 feet x  3 laps goal with forehead probe 04/11/2020   O2 used ra  Number laps completed 3  Comments about pace avg  Resting Pulse Ox/HR 98% and 84/min  Final Pulse Ox/HR 98% and 82/min  Desaturated </= 88% no  Desaturated <= 3% points no  Got Tachycardic >/= 90/min no  Symptoms at end of test No dyspnea  Miscellaneous comments x      CT chest June 2017  IMPRESSION: Small bilateral pleural effusions and mild atelectasis/scarring over the lingula and left lower lobe.   Small pericardial effusion.   Three vessel atherosclerotic coronary artery disease. Aortic atherosclerosis.   Right-sided aortic arch with aberrant left subclavian artery.     Electronically Signed   By: Scott [name] M.D.   On: 01/30/2016 15:56   ROS - per HPI  Results for [name], HAKEEN PULEIO "Scott [name]" (MRN [id number]) as of 04/11/2020 09:01  Ref. Range 06/30/2016 11:38 07/10/2016 14:18 07/11/2016 06:12 07/12/2016 03:44 07/13/2016 03:13  Hemoglobin Latest Ref Range: 13.0 - 17.0 g/dL 13.9 13.5 12.3 (L) 11.9 (L) 11.1 (L)  Results for [name], Kamaal D "  Wille Glaser (MRN [id number]) as of 04/11/2020 09:01  Ref. Range 01/31/2016 05:58 06/30/2016 11:38 07/10/2016 14:18 07/11/2016 06:12 07/13/2016 03:13  Creatinine Latest Ref Range: 0.61 - 1.24 mg/dL 1.32 (H) 1.11 1.18 1.26 (H) 1.31 (H)    Echocardiogram February 07, 2020  Echocardiogram 02/07/2020:  Normal LV systolic function with EF 59%. Left ventricle cavity is normal  in size. Mild concentric hypertrophy of the left ventricle. Normal global  wall motion. Doppler evidence of grade I (impaired) diastolic dysfunction,  normal LAP. Calculated EF 59%.  Left atrial cavity is moderately dilated.  Structurally normal mitral valve.  Mild (Grade I) mitral regurgitation.  Pericardium is normal. Insignificant pericardial effusion with clear  fluids.  IVC is dilated with respiratory  variation. This may suggest elevated right  heart pressure  No significant change from 05/09/2015.  Lexiscan/modified Bruce Tetrofosmin stress test 02/14/2020: Lexiscan/modified Bruce nuclear stress test performed using 1-day protocol. Stress EKG is non-diagnostic, as this is pharmacological stress test. In addition, stress EKG at 110% MPHR showed sinus tachycardia, old inferior infarct. Mild decrease in reduced radiotracer update in inferior myocardium at rest and stress. In setting of normal myocardial thickening, this likely represents tissue attenuation artifact. Stress LVEF 77%. Low risk study.   OV 05/15/2020  Subjective:  Patient ID: Scott [name], male , DOB: 1935-12-27 , age 77 y.o. , MRN: [id number] , ADDRESS: [address] [place] [address] PCP Scott Gravel, MD Relevant Other Providers: - [name] This Provider for this visit: Treatment Team:  Attending Provider: Brand Males, MD    05/15/2020 -   Chief Complaint  Patient presents with   Follow-up    CT scan 9/13, PFT Today wants to talk about results of both, breathing the same as last visit shortness of breath with exertion     HPI Scott [name] 85 y.o. -here to discuss test results.  Pulmonary function test essentially normal..  CT scan shows cirrhosis with trace pleural effusion and some air trapping but otherwise normal.  He is not aware of the fact that he has cirrhosis.  This was followed up with a right upper quadrant ultrasound that did not show cirrhosis but does asymptomatic gallstones he denies any drinking.  There is no interstitial lung disease or pneumonia or cancer.  However he still has symptoms.  At this point in time his symptoms are minimal.  He states that his main priority is to make sure there is nothing acutely abnormal with his lungs explain his symptoms.  He is willing to try an empiric inhaler    CLINICAL DATA:  Increased shortness of breath on exertion, wheezing and chronic cough.   EXAM: CT  CHEST WITHOUT CONTRAST   TECHNIQUE: Multidetector CT imaging of the chest was performed following the standard protocol without intravenous contrast. High resolution imaging of the lungs, as well as inspiratory and expiratory imaging, was performed.   COMPARISON:  01/30/2016.   FINDINGS: Cardiovascular: Right-sided aortic arch. Atherosclerotic calcification of the aorta, aortic valve and coronary arteries. Heart is enlarged. Small amount of pericardial fluid is unchanged.   Mediastinum/Nodes: No pathologically enlarged mediastinal or axillary lymph nodes. Hilar regions are difficult to definitively evaluate without IV contrast. There are calcified right hilar lymph nodes. Esophagus is grossly unremarkable.   Lungs/Pleura: Image quality is degraded by respiratory motion. Negative for subpleural reticulation, traction bronchiectasis/bronchiolectasis, ground-glass, architectural distortion or honeycombing. Trace right pleural fluid. Airway is unremarkable. There is air trapping.   Upper Abdomen: Liver margin is irregular. Stones  layer in the gallbladder. Adrenal glands are unremarkable. Low-attenuation lesions in the kidneys measure up to 3.2 cm on the right, incompletely imaged. Visualized portions of the spleen, pancreas, stomach and bowel are unremarkable with the exception of a small hiatal hernia.   Musculoskeletal: Degenerative changes in the spine and shoulders. No worrisome lytic or sclerotic lesions. Old rib fractures.   IMPRESSION: 1. Negative for interstitial lung disease. Air trapping is indicative of small airways disease. 2. Trace right pleural effusion. 3. Cirrhosis. 4. Cholelithiasis. 5. Aortic atherosclerosis (ICD10-I70.0). Coronary artery calcification.     Electronically Signed   By: Lorin Picket M.D.   On: 04/22/2020 12:06   IMPRESSION: Cholelithiasis without evidence of cholecystitis. No other abnormality seen in the right upper quadrant of the  abdomen.     Electronically Signed   By: Marijo Conception M.D.   On: 05/01/2020 16:11 ROS - per HPI  Results for [place], CHIKA WEB "Scott [name]" (MRN [id number]) as of 05/15/2020 10:50  Ref. Range 04/11/2020 10:02  IgE (Immunoglobulin E), Serum Latest Ref Range: <OR=114 kU/L 56  R  Results for Masters, KROSS NICASIO" (MRN [id number]) as of 05/15/2020 10:50  Ref. Range 04/11/2020 10:02  Eosinophils Absolute Latest Ref Range: 0 - 0 K/uL 0.2  Results for Zetina, ELMER FICHTEL" (MRN [id number]) as of 05/15/2020 10:50  Ref. Range 04/11/2020 10:02  Hemoglobin Latest Ref Range: 13.0 - 17.0 g/dL 13.6    OV 03/24/2021  Subjective:  Patient ID: Scott [name], male , DOB: 1936-01-05 , age 54 y.o. , MRN: [id number] , ADDRESS: [place] [address] PCP Scott Gravel, MD Patient Care Team: Scott Gravel, MD as PCP - General (Internal Medicine)  This Provider for this visit: Treatment Team:  Attending Provider: Brand Males, MD    03/24/2021 -   Chief Complaint  Patient presents with   Follow-up    Pt states he has been doing okay since last visit.     HPI Scott [name] 85 y.o. -follow-up for dyspnea, history of wheezing, chronic cough with the only evidence of asthma being pulmonary air-trapping seen on previous PFT and CT scan [also found to have subclinical cirrhosis and some trace pleural effusion on CT scan a year ago] at this visit he says he has been busy taking care of his wife Almedia Balls with urologic issues.  His sister-in-law also has been sick earlier in the year.  He says that he is using Advair right now because it is cheaper than air duo.  He tells me that he does not think the Advair has helped his symptoms but he believes that his wife has reported that his cough and shortness of breath and wheezing is significantly better.  Therefore he is inclined to continue with it.  He says other than getting older and feeling a generally little more tired he has no other health issues.    CT Chest  data  No results found.    PFT  PFT Results Latest Ref Rng & Units 05/15/2020  FVC-Pre L 3.14  FVC-Predicted Pre % 97  FVC-Post L 3.18  FVC-Predicted Post % 99  Pre FEV1/FVC % % 82  Post FEV1/FCV % % 87  FEV1-Pre L 2.56  FEV1-Predicted Pre % 114  FEV1-Post L 2.75  DLCO uncorrected ml/min/mmHg 23.17  DLCO UNC% % 110  DLCO corrected ml/min/mmHg 23.87  DLCO COR %Predicted % 114  DLVA Predicted % 114  TLC L 5.83  TLC % Predicted % 94  RV % Predicted % 101       has a past medical history of Acid reflux, Adenomatous colon polyp (01/2002), Arthritis (2004), Coronary artery disease (2007), Enlarged prostate, Hyperlipidemia, Hypertension, Myocardial infarction ([place]) (2007), Neuromuscular disorder ([place]), Prostate cancer ([place]) (2013), Sepsis secondary to UTI [facility]), Shortness of breath dyspnea, Sleep apnea (2001), and Venous insufficiency.   reports that he has never smoked. He has never used smokeless tobacco.  Past Surgical History:  Procedure Laterality Date   ANGIOPLASTY  09/18/2005   1 stent placed   [place] BIOPSY  12/30/2000   ROTATOR CUFF REPAIR  2004   right   TONSILLECTOMY AND ADENOIDECTOMY  1950   TOTAL KNEE ARTHROPLASTY Left 07/10/2016   Procedure: TOTAL KNEE ARTHROPLASTY;  Surgeon: Netta Cedars, MD;  Location: [place];  Service: Orthopedics;  Laterality: Left;   VASECTOMY  1970    Allergies  Allergen Reactions   Shellfish Allergy Nausea And Vomiting   Sulfa Antibiotics Other (See Comments)    Feels fatigued and weak when taking sulfa for long durations     Immunization History  Administered Date(s) Administered   PFIZER(Purple Top)SARS-COV-2 Vaccination 09/01/2019, 09/22/2019, 05/14/2020    Family History  Problem Relation Age of Onset   Prostate cancer Brother    Diabetes Father    Heart disease Father    Diabetes Sister    Diabetes Brother    Heart disease Mother    Diabetes Son      Current Outpatient Medications:     alfuzosin (UROXATRAL) 10 MG 24 hr tablet, Take 10 mg by mouth at bedtime., Disp: , Rfl:    aspirin 81 MG tablet, Take 81 mg by mouth at bedtime. , Disp: , Rfl:    atorvastatin (LIPITOR) 40 MG tablet, Take 40 mg by mouth every evening., Disp: , Rfl:    cetirizine (ZYRTEC) 10 MG tablet, Take 10 mg by mouth daily before breakfast. , Disp: , Rfl:    eplerenone (INSPRA) 25 MG tablet, TAKE 1 TABLET BY MOUTH IN  THE MORNING, Disp: 90 tablet, Rfl: 3   ezetimibe (ZETIA) 10 MG tablet, TAKE 1 TABLET BY MOUTH  DAILY AFTER SUPPER, Disp: 90 tablet, Rfl: 3   ferrous sulfate 325 (65 FE) MG EC tablet, Take 325 mg by mouth every evening. , Disp: , Rfl:    finasteride (PROSCAR) 5 MG tablet, Take 5 mg by mouth every evening. , Disp: , Rfl:    fluticasone (FLONASE) 50 MCG/ACT nasal spray, Place into both nostrils daily., Disp: , Rfl:    fluticasone-salmeterol (ADVAIR HFA) 115-21 MCG/ACT inhaler, Inhale 2 puffs into the lungs 2 (two) times daily., Disp: 3 each, Rfl: 2   gabapentin (NEURONTIN) 300 MG capsule, Take 300 mg by mouth 2 (two) times daily., Disp: , Rfl:    montelukast (SINGULAIR) 10 MG tablet, Take 10 mg by mouth every morning., Disp: , Rfl:    Multiple Vitamin (MULTIVITAMIN) tablet, Take 1 tablet by mouth at bedtime. , Disp: , Rfl:    oxybutynin (DITROPAN) 5 MG tablet, Take 5 mg by mouth 2 (two) times daily., Disp: , Rfl:    pantoprazole (PROTONIX) 20 MG tablet, Take 40 mg by mouth daily before breakfast. , Disp: , Rfl:    vitamin C (ASCORBIC ACID) 500 MG tablet, Take 1,000 mg by mouth 2 (two) times daily., Disp: , Rfl:       Objective:   Vitals:   03/24/21 1522  BP: 128/64  Pulse: (!) 50  Temp: 98.4 F (36.9 C)  TempSrc: Oral  SpO2: 97%  Weight: 168 lb 6.4 oz (76.4 kg)  Height: '5\' 6"'$  (1.676 m)    Estimated body mass index is 27.18 kg/m as calculated from the following:   Height as of this encounter: '5\' 6"'$  (1.676 m).   Weight as of this encounter: 168 lb 6.4 oz (76.4  kg).  '@WEIGHTCHANGE'$ @  [employer]   03/24/21 1522  Weight: 168 lb 6.4 oz (76.4 kg)     Physical Exam General: No distress. Looks well Neuro: Alert and Oriented x 3. GCS 15. Speech normal Psych: Pleasant Resp:  Barrel Chest - no.  Wheeze - no, Crackles - no, No overt respiratory distress CVS: Normal heart sounds. Murmurs - no Ext: Stigmata of Connective Tissue Disease - no HEENT: Normal upper airway. PEERL +. No post nasal drip.  Has bilateral hearing aids        Assessment:       ICD-10-CM   1. Dyspnea on exertion  R06.00     2. History of wheezing  Z87.898     3. Chronic cough  R05.3     4. Pulmonary air trapping  R09.89          Plan:     Patient Instructions     ICD-10-CM   1. Dyspnea on exertion  R06.00     2. History of wheezing  Z87.898     3. Chronic cough  R05.3     4. Pulmonary air trapping  R09.89       - seems In past 1 year that empiric advair is helping you for above. Therefore, suspect you have mild asthma -I do not know what to make of the small trace right pleural effusion that was seen on CT last year though cxr June 2021 was clar  Plan -do cxr 2 view to ensure pleural effusion not worwse  -continue advair scheduled daily with albuterol as needed  Follow-up -6-9 months or sooner if needed    SIGNATURE    Dr. Brand Males, M.D., F.C.C.P,  Pulmonary and Critical Care Medicine Staff Physician, [place] Director - Interstitial Lung Disease  Program  Pulmonary [place] at [place], [place], [address]  Pager: [phone], If no answer or between  15:00h - 7:00h: call [phone] Telephone: [phone]  4:09 PM 03/24/2021

## 2021-03-24 NOTE — Patient Instructions (Addendum)
ICD-10-CM   1. Dyspnea on exertion  R06.00     2. History of wheezing  Z87.898     3. Chronic cough  R05.3     4. Pulmonary air trapping  R09.89       - seems In past 1 year that empiric advair is helping you for above. Therefore, suspect you have mild asthma -I do not know what to make of the small trace right pleural effusion that was seen on CT last year though cxr June 2021 was clar  Plan -do cxr 2 view to ensure pleural effusion not worwse  -continue advair scheduled daily with albuterol as needed  Follow-up -6-9 months or sooner if needed

## 2021-03-25 NOTE — Progress Notes (Signed)
IMPRESSION: Enlargement of cardiac silhouette with RIGHT-side aortic arch again incidentally noted.  No acute abnormalities.  Aortic Atherosclerosis (ICD10-I70.0).   Electronically Signed   By: Lavonia Dana M.D.   On: 03/25/2021 08:13

## 2021-05-01 ENCOUNTER — Ambulatory Visit: Payer: Medicare Other | Attending: Internal Medicine

## 2021-05-01 DIAGNOSIS — Z23 Encounter for immunization: Secondary | ICD-10-CM

## 2021-05-01 NOTE — Progress Notes (Signed)
   Covid-19 Vaccination Clinic  Name:  Scott [name]    MRN: [id number] DOB: 1936-03-08  05/01/2021  [name] was observed post Covid-19 immunization for 15 minutes without incident. He was provided with Vaccine Information Sheet and instruction to access the V-Safe system.   [name] was instructed to call [phone] with any severe reactions post vaccine: Difficulty breathing  Swelling of face and throat  A fast heartbeat  A bad rash all over body  Dizziness and weakness

## 2021-05-07 ENCOUNTER — Other Ambulatory Visit (HOSPITAL_BASED_OUTPATIENT_CLINIC_OR_DEPARTMENT_OTHER): Payer: Self-pay

## 2021-05-07 MED ORDER — COVID-19MRNA BIVAL VACC PFIZER 30 MCG/0.3ML IM SUSP
INTRAMUSCULAR | 0 refills | Status: DC
  Filled 2021-05-07: qty 0.3, 1d supply, fill #0

## 2021-08-29 IMAGING — CR DG CHEST 2V
2 series · 2 of 2 positions shown · non-contrast
Comparison: Chest x-ray 07/12/2016.

CLINICAL DATA: 84-year-old male with history of shortness of breath
and dyspnea.

EXAM:
CHEST - 2 VIEW

[w chest pa]
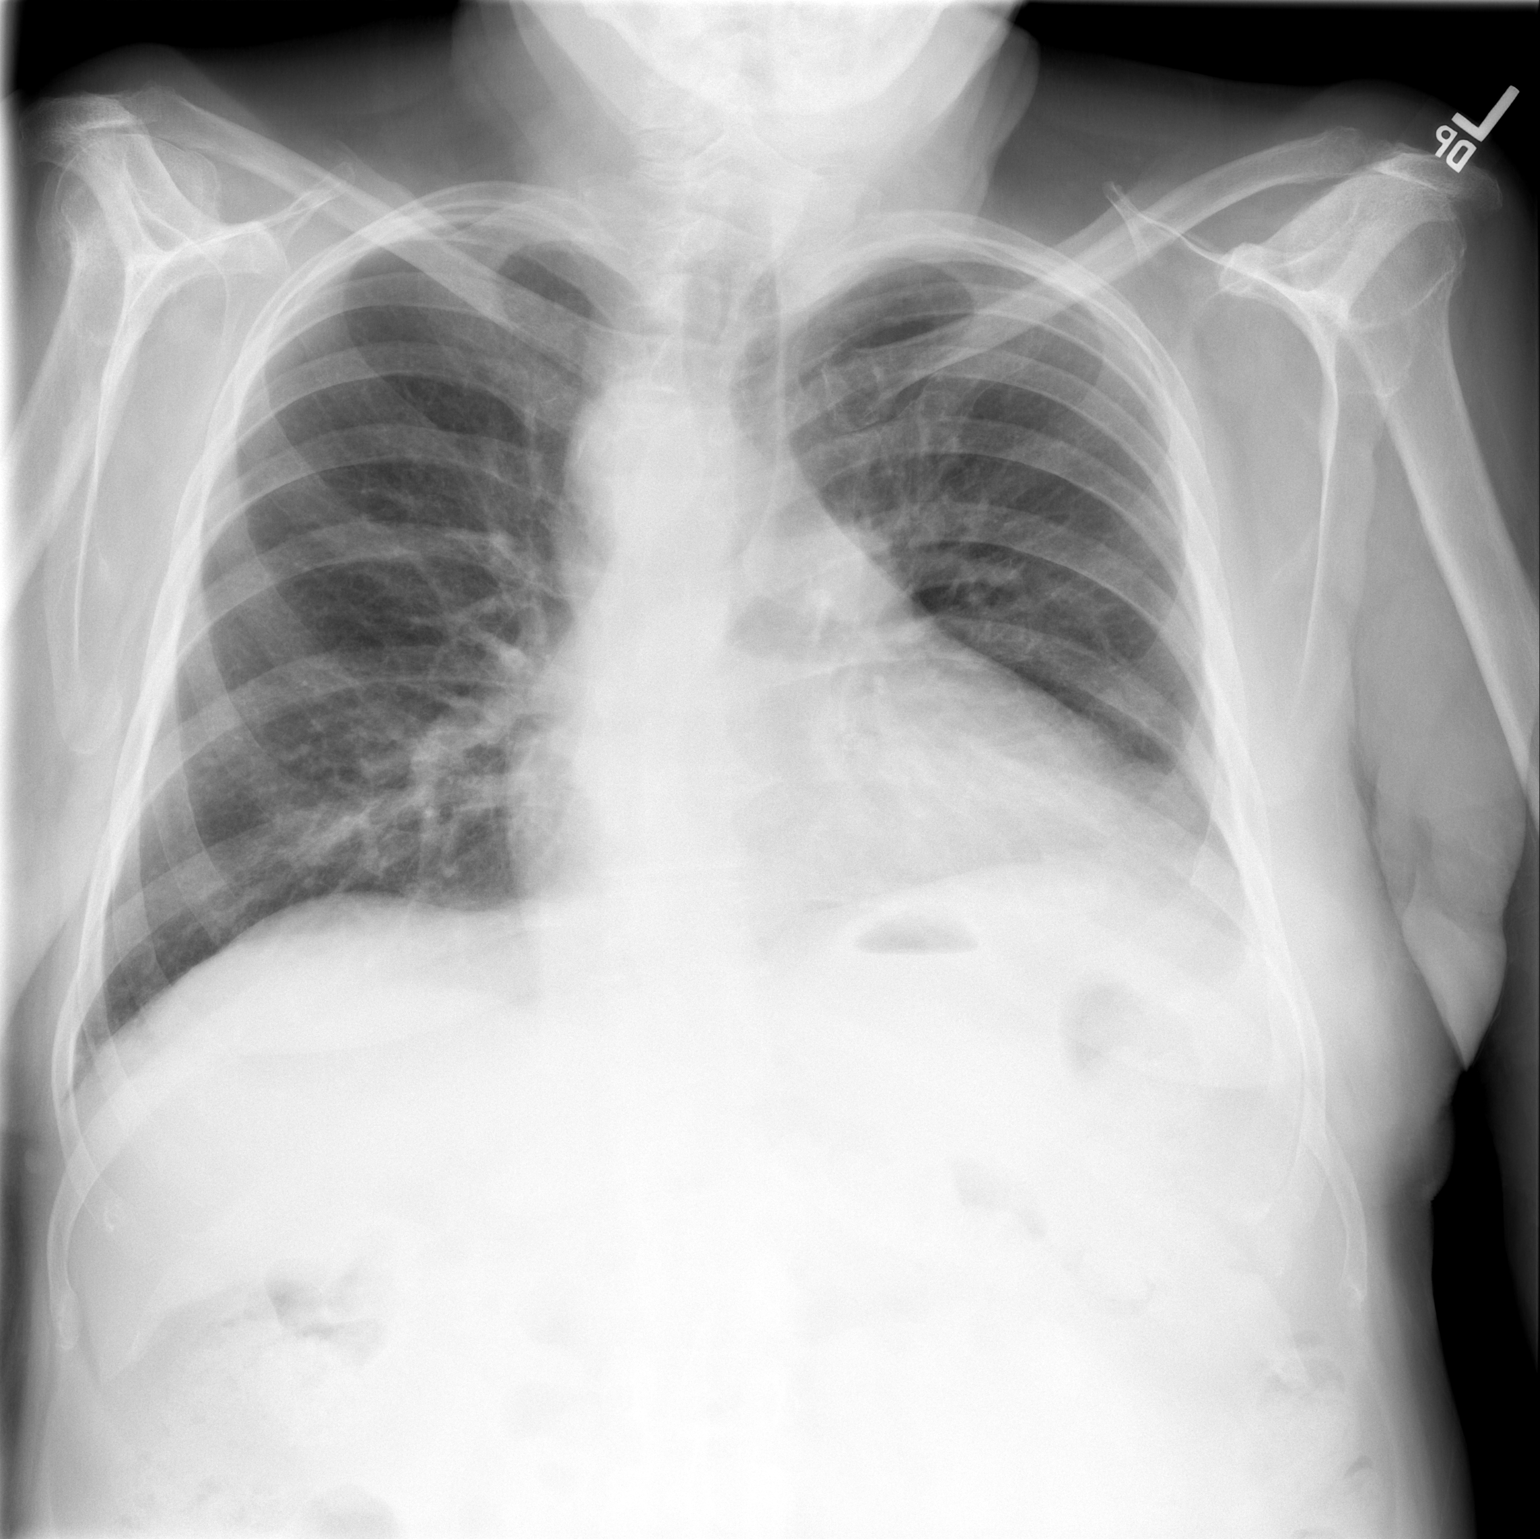

[w chest lat]
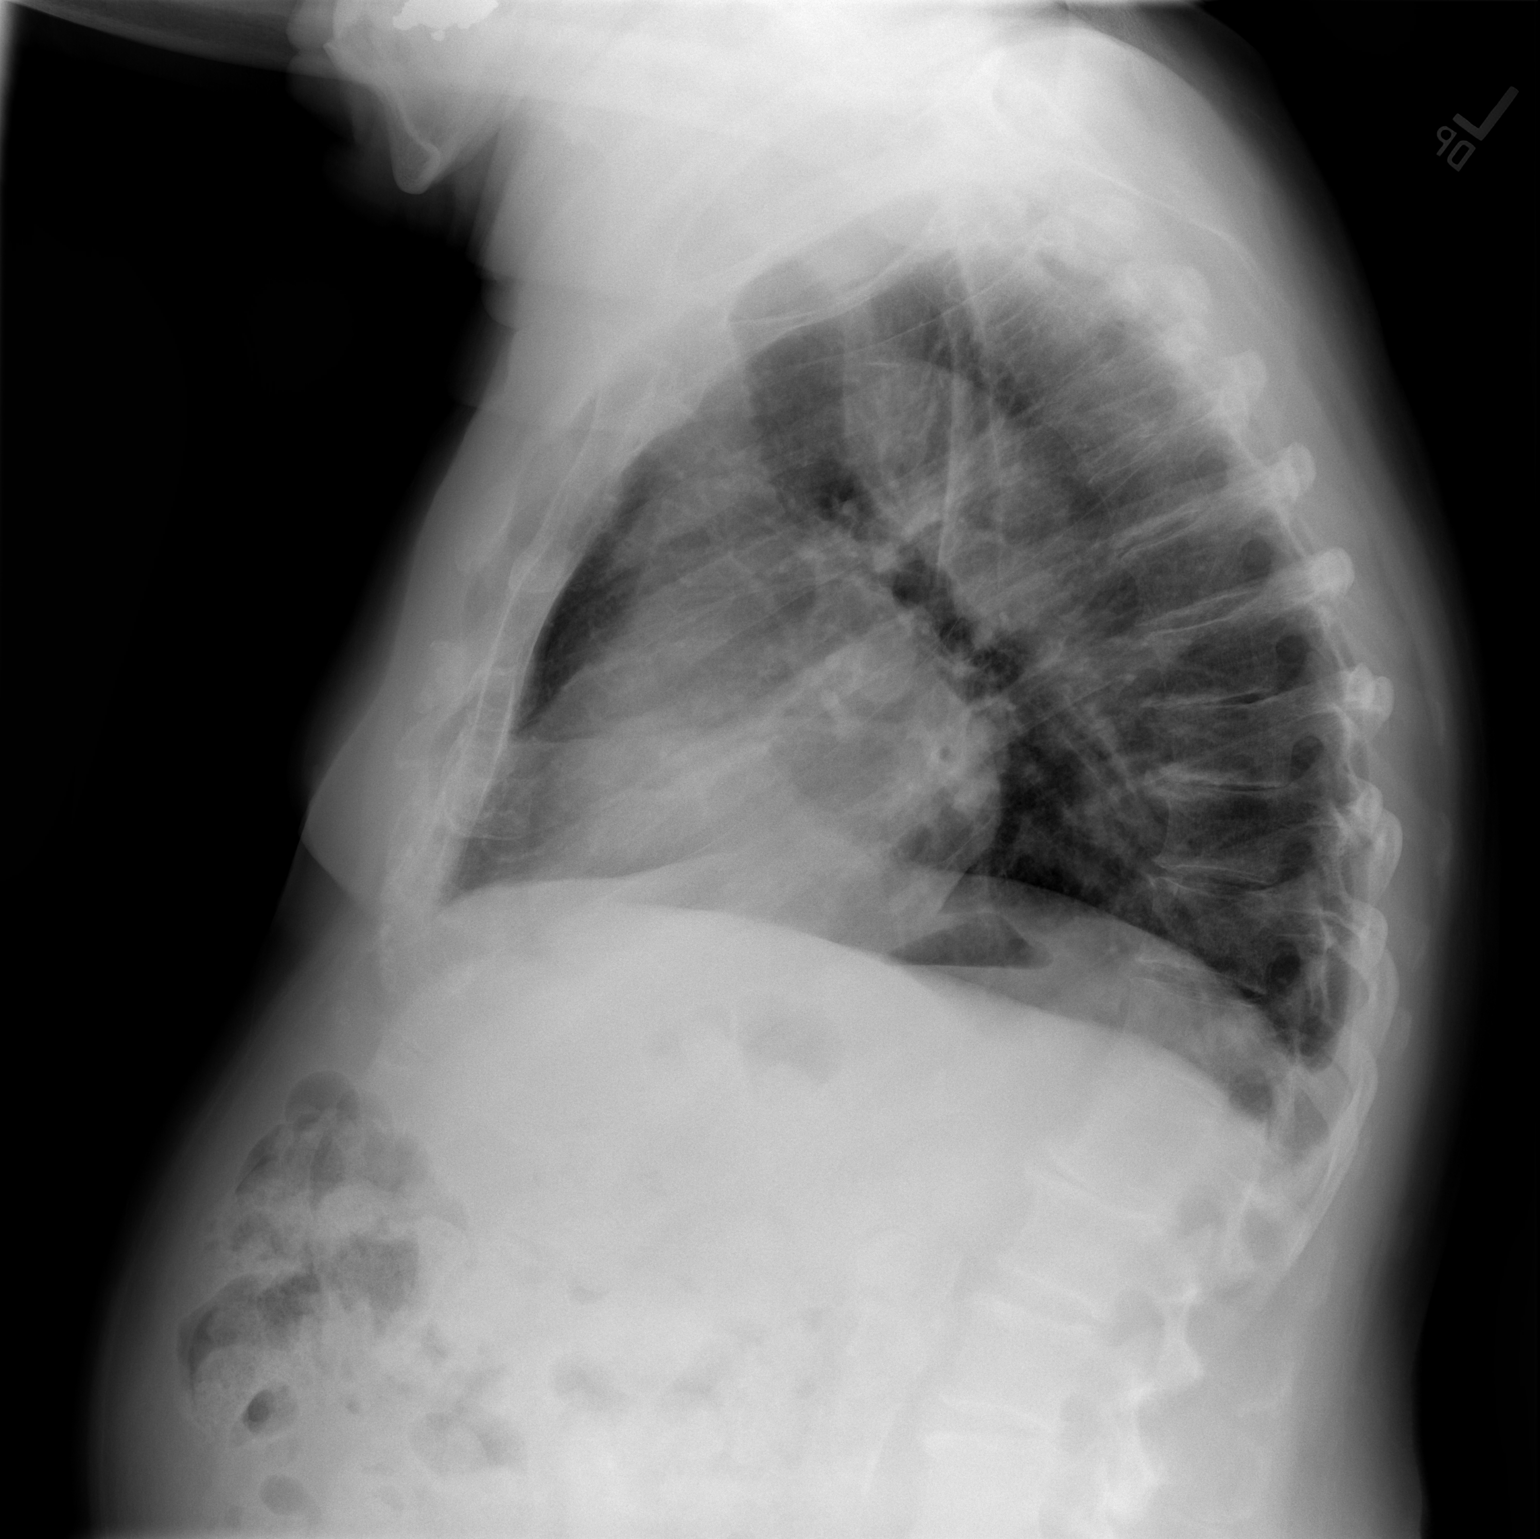

[2 of 2 positions shown; findings below may reference images not displayed]

FINDINGS: Lung volumes are low. No consolidative airspace disease. No pleural
effusions. No pneumothorax. No pulmonary nodule or mass noted.
Right-sided aortic arch (normal anatomical variant) incidentally
noted. Pulmonary vasculature and the cardiomediastinal silhouette
are otherwise within normal limits. Aortic atherosclerosis.
IMPRESSION: 1. No radiographic evidence of acute cardiopulmonary disease.
2. Right aortic arch (normal anatomical variant) incidentally noted.
3. Aortic atherosclerosis.

## 2021-09-11 ENCOUNTER — Ambulatory Visit: Payer: Medicare Other | Admitting: Cardiology

## 2021-09-15 ENCOUNTER — Ambulatory Visit: Payer: Medicare Other | Admitting: Cardiology

## 2021-09-15 ENCOUNTER — Encounter: Payer: Self-pay | Admitting: Cardiology

## 2021-09-15 ENCOUNTER — Other Ambulatory Visit: Payer: Self-pay

## 2021-09-15 VITALS — BP 130/70 | HR 64 | Temp 98.3°F | Resp 17 | Ht 66.0 in | Wt 168.6 lb

## 2021-09-15 DIAGNOSIS — I2510 Atherosclerotic heart disease of native coronary artery without angina pectoris: Secondary | ICD-10-CM

## 2021-09-15 DIAGNOSIS — I10 Essential (primary) hypertension: Secondary | ICD-10-CM

## 2021-09-15 DIAGNOSIS — E7800 Pure hypercholesterolemia, unspecified: Secondary | ICD-10-CM

## 2021-09-15 NOTE — Progress Notes (Signed)
Primary Physician/Referring:  Janie Morning, DO  Patient ID: Scott [name], male    DOB: [birth year], 86 y.o.   MRN: [id number]  Chief Complaint  Patient presents with   Follow-up    1 YEAR   Coronary Artery Disease   Hyperlipidemia   HPI:    Scott [name]  is a 86 y.o. with hypertension, hyperlipidemia, obstructive sleep apnea on CPAP and is compliant, coronary artery disease and h/o PTCA Mid RCA 3.5x20 mm Taxus stent (DES) in 2007 when he presented with AMI while visiting [place].   Except for chronic dyspnea on exertion, no chest pain.  He denies any leg edema, no PND or orthopnea.  Past Medical History:  Diagnosis Date   Acid reflux    history of Barretts esophagus    Adenomatous colon polyp 01/2002   Arthritis 2004   Coronary artery disease 2007   cardiac cath w/ angioplasty & stent placement x 1   Enlarged prostate    Hyperlipidemia    Hypertension    Myocardial infarction ([place]) 2007   while vacationing in [place], cath. done there & x1 stent placed    Neuromuscular disorder ([place])    carpal tunnel - both hands    Prostate cancer ([place]) 2013   biopsy- active survelliance, annual biopsies    Sepsis secondary to UTI [facility])    Shortness of breath dyspnea    Sleep apnea 2001   Venous insufficiency    Past Surgical History:  Procedure Laterality Date   ANGIOPLASTY  09/18/2005   1 stent placed   [place] BIOPSY  12/30/2000   ROTATOR CUFF REPAIR  2004   right   TONSILLECTOMY AND ADENOIDECTOMY  1950   TOTAL KNEE ARTHROPLASTY Left 07/10/2016   Procedure: TOTAL KNEE ARTHROPLASTY;  Surgeon: Netta Cedars, MD;  Location: [facility];  Service: Orthopedics;  Laterality: Left;   VASECTOMY  1970   Social History   Tobacco Use   Smoking status: Never   Smokeless tobacco: Never  Substance Use Topics   Alcohol use: No   Marital Status: Married   ROS  Review of Systems  Cardiovascular:  Positive for dyspnea on exertion. Negative for chest pain and leg swelling.   Respiratory:  Positive for wheezing (occasaional).   Musculoskeletal:  Positive for arthritis and joint pain.  Gastrointestinal:  Negative for melena.  Objective   Vitals with BMI 09/15/2021 09/15/2021 03/24/2021  Height - 5' 6"  5' 6"   Weight - 168 lbs 10 oz 168 lbs 6 oz  BMI - 02.72 53.66  Systolic 440 347 425  Diastolic 70 70 64  Pulse 64 69 50    Blood pressure 130/70, pulse 64, temperature 98.3 F (36.8 C), temperature source Temporal, resp. rate 17, height 5' 6"  (1.676 m), weight 168 lb 9.6 oz (76.5 kg), SpO2 98 %. Body mass index is 27.21 kg/m.   Physical Exam Neck:     Vascular: No carotid bruit or JVD.  Cardiovascular:     Rate and Rhythm: Normal rate and regular rhythm.     Pulses: Intact distal pulses.     Heart sounds: Normal heart sounds. No murmur heard.   No gallop.  Pulmonary:     Effort: Pulmonary effort is normal.     Breath sounds: Normal breath sounds.  Abdominal:     General: Bowel sounds are normal.     Palpations: Abdomen is soft.  Musculoskeletal:     Right lower leg: No edema.  Left lower leg: No edema.   Laboratory examination:   External Labs:  Labs 11/22/2020:  A1c 6.5%.  TSH normal at 3.880.  Total cholesterol 115, triglycerides 55, HDL 47, LDL 57.  Hb 12.5/HCT 38.8, platelets 203.  Normal indicis.  Sodium 141, potassium 4.3, BUN 26, creatinine 1.11, EGFR 61 mL, LFTs normal.   Medications and allergies   Allergies  Allergen Reactions   Shellfish Allergy Nausea And Vomiting   Sulfa Antibiotics Other (See Comments)    Feels fatigued and weak when taking sulfa for long durations     Outpatient Medications Prior to Visit  Medication Sig Dispense Refill   alfuzosin (UROXATRAL) 10 MG 24 hr tablet Take 10 mg by mouth at bedtime.     Ascorbic Acid (VITAMIN C) 500 MG CAPS      aspirin 81 MG tablet Take 81 mg by mouth at bedtime.      atorvastatin (LIPITOR) 40 MG tablet Take 40 mg by mouth every evening.     cetirizine (ZYRTEC) 10 MG  tablet Take 10 mg by mouth daily before breakfast.      COVID-19 mRNA bivalent vaccine, Pfizer, injection Inject into the muscle. 0.3 mL 0   eplerenone (INSPRA) 25 MG tablet TAKE 1 TABLET BY MOUTH IN  THE MORNING 90 tablet 3   ezetimibe (ZETIA) 10 MG tablet TAKE 1 TABLET BY MOUTH  DAILY AFTER SUPPER 90 tablet 3   ferrous sulfate 325 (65 FE) MG EC tablet Take 325 mg by mouth every evening.      finasteride (PROSCAR) 5 MG tablet Take 5 mg by mouth every evening.      fluticasone (FLONASE) 50 MCG/ACT nasal spray Place into both nostrils daily.     fluticasone-salmeterol (ADVAIR HFA) 115-21 MCG/ACT inhaler Inhale 2 puffs into the lungs 2 (two) times daily. 3 each 2   gabapentin (NEURONTIN) 300 MG capsule Take 300 mg by mouth 2 (two) times daily.     ketoconazole (NIZORAL) 2 % cream Apply 1 application topically 2 (two) times daily as needed.     montelukast (SINGULAIR) 10 MG tablet Take 10 mg by mouth every morning.     Multiple Vitamin (MULTIVITAMIN) tablet Take 1 tablet by mouth at bedtime.      oxybutynin (DITROPAN) 5 MG tablet Take 5 mg by mouth 2 (two) times daily.     pantoprazole (PROTONIX) 20 MG tablet Take 40 mg by mouth daily before breakfast.      metaxalone (SKELAXIN) 800 MG tablet 1 tablet     vitamin C (ASCORBIC ACID) 500 MG tablet Take 1,000 mg by mouth 2 (two) times daily.     No facility-administered medications prior to visit.   No orders of the defined types were placed in this encounter.  Medications Discontinued During This Encounter  Medication Reason   vitamin C (ASCORBIC ACID) 176 MG tablet Duplicate   metaxalone (SKELAXIN) 800 MG tablet       Radiology:  CXR 02/07/2020: Lung volumes are low. No consolidative airspace disease. No pleural effusions. No pneumothorax. No pulmonary nodule or mass noted. Right-sided aortic arch (normal anatomical variant) incidentally noted. Pulmonary vasculature and the cardiomediastinal silhouette are otherwise within normal limits.  Aortic atherosclerosis.    Cardiac Studies:   Coronary Angiography H/O Mild MI-Heart cath/stent in 2007 [facility]), Mid RCA 3.5x20 mm Taxus stent (DES).  Lexiscan/modified Bruce Tetrofosmin stress test 02/14/2020: Lexiscan/modified Bruce nuclear stress test performed using 1-day protocol. Stress EKG is non-diagnostic, as this is pharmacological stress test. In  addition, stress EKG at 110% MPHR showed sinus tachycardia, old inferior infarct. Mild decrease in reduced radiotracer update in inferior myocardium at rest and stress. In setting of normal myocardial thickening, this likely represents tissue attenuation artifact. Stress LVEF 77%. Low risk study.  Echocardiogram 02/07/2020:  Normal LV systolic function with EF 59%. Left ventricle cavity is normal  in size. Mild concentric hypertrophy of the left ventricle. Normal global  wall motion. Doppler evidence of grade I (impaired) diastolic dysfunction,  normal LAP. Calculated EF 59%.  Left atrial cavity is moderately dilated.  Structurally normal mitral valve.  Mild (Grade I) mitral regurgitation.  Pericardium is normal. Insignificant pericardial effusion with clear  fluids.  IVC is dilated with respiratory variation. This may suggest elevated right  heart pressure  No significant change from 05/09/2015.   EKG:  EKG 09/15/2021: Normal sinus rhythm at rate of 64 bpm, inferior infarct old.  Poor R wave progression, probably normal variant however cannot exclude anteroseptal infarct old.  No evidence of ischemia.  Normal QT interval.  No significant change from 09/09/2020.  Assessment     ICD-10-CM   1. Essential hypertension  I10 EKG 12-Lead    2. Coronary artery disease involving native coronary artery of native heart without angina pectoris  I25.10     3. Hypercholesteremia  E78.00      No orders of the defined types were placed in this encounter.   Medications Discontinued During This Encounter  Medication Reason   vitamin C (ASCORBIC  ACID) 872 MG tablet Duplicate   metaxalone (SKELAXIN) 800 MG tablet     Orders Placed This Encounter  Procedures   EKG 12-Lead    Recommendations:   Scott [name]  is a 86 y.o. with hypertension, hyperlipidemia, obstructive sleep apnea on CPAP and is compliant, coronary artery disease and h/o PTCA Mid RCA 3.5x20 mm Taxus stent (DES) in 2007 when he presented with AMI while visiting [place].  Except for chronic dyspnea on exertion no new symptomatology.  He has not had any recurrence of angina pectoris.  Blood pressures well controlled, I reviewed his external labs, lipids are also under excellent control.  EKG is unchanged from prior EKG.  No changes were done today.  He has been busy with his second wife's health issues (urinary incontinence).  Encouraged him to continue to remain active.  I will see him back on annual basis.    Adrian Prows, MD, [facility] 09/15/2021, 12:41 PM Office: [phone]

## 2021-11-04 ENCOUNTER — Telehealth (HOSPITAL_COMMUNITY): Payer: Self-pay

## 2021-11-04 NOTE — Telephone Encounter (Signed)
Returned call, no answer, left vm. AW  ?

## 2021-11-21 IMAGING — US US ABDOMEN LIMITED
1 series · 14 of 25 positions shown · non-contrast
Comparison: April 22, 2020.

CLINICAL DATA: Possible hepatic cirrhosis.

EXAM:
ULTRASOUND ABDOMEN LIMITED RIGHT UPPER QUADRANT

[Series 1: us abdomen limited · 0.22mm/px · 14 of 52 slices shown]
[im 1/52]
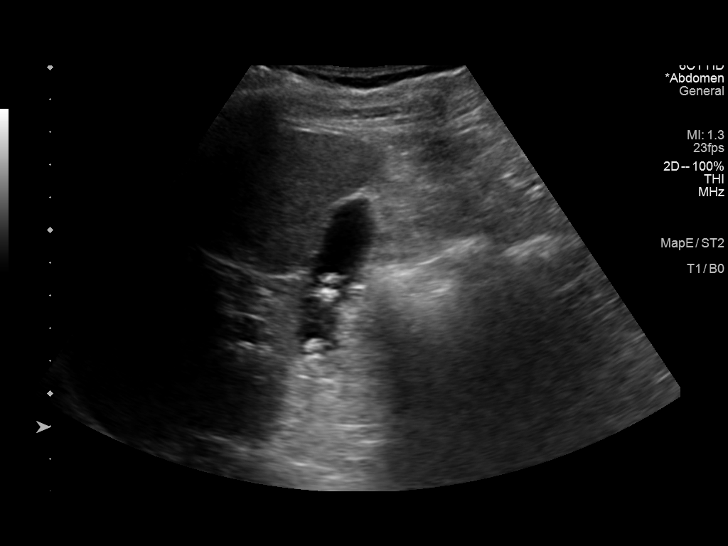
[im 5/52]
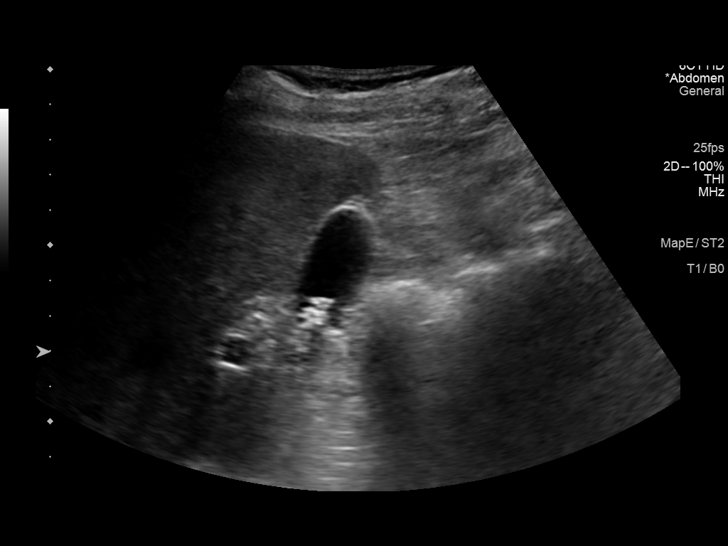
[im 9/52]
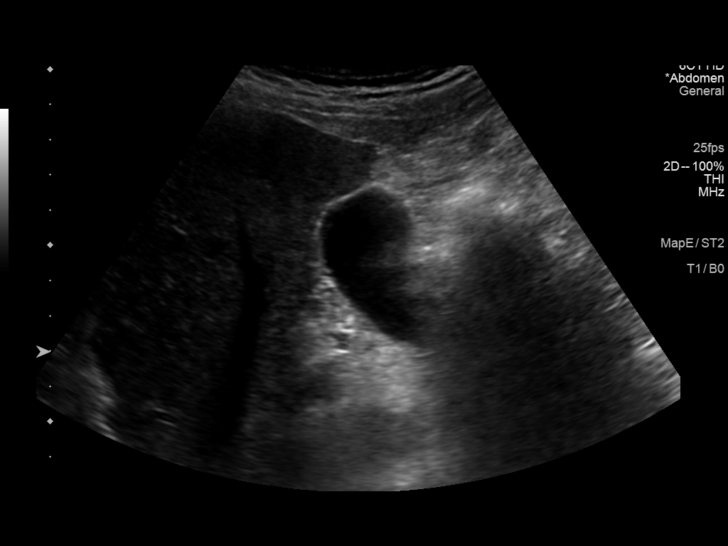
[im 13/52]
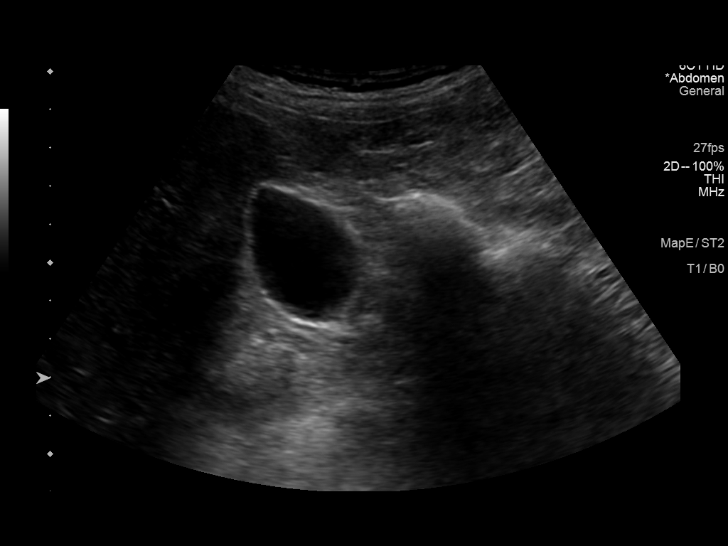
[im 18/52]
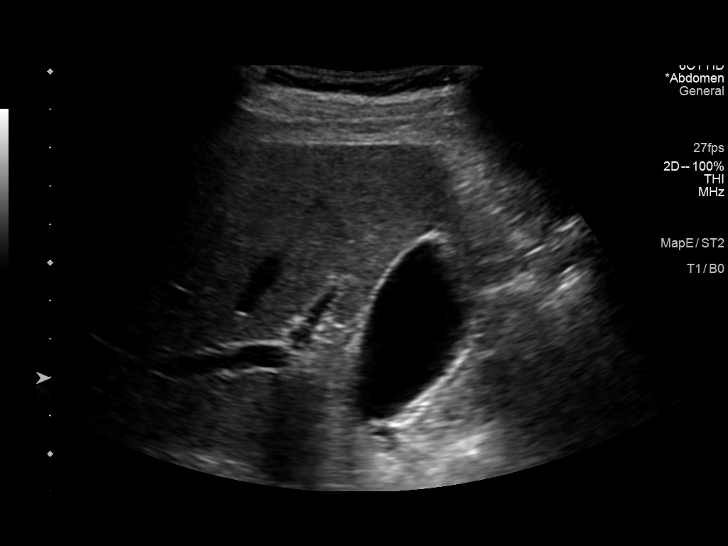
[im 20/52]
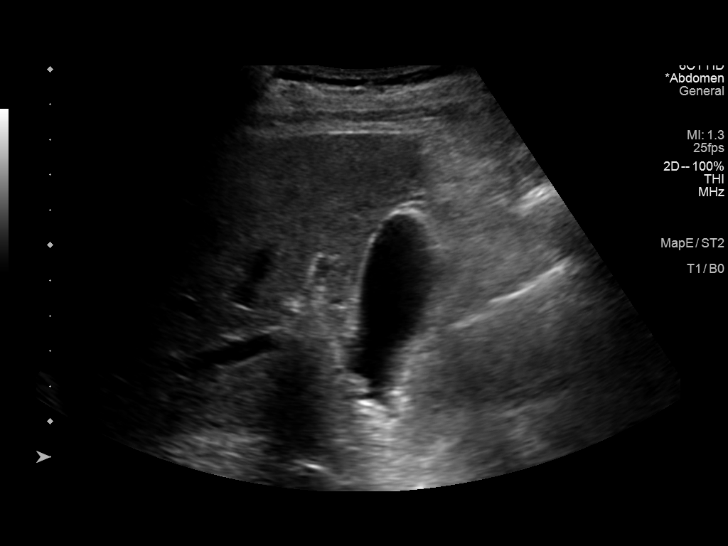
[im 24/52]
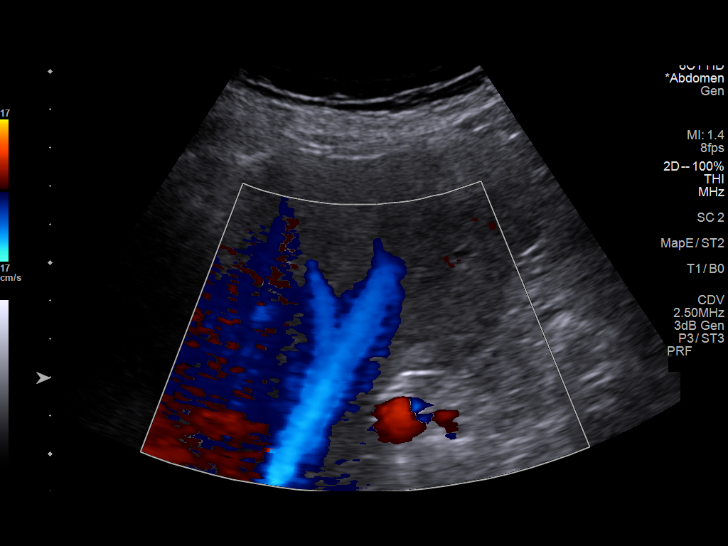
[im 28/52]
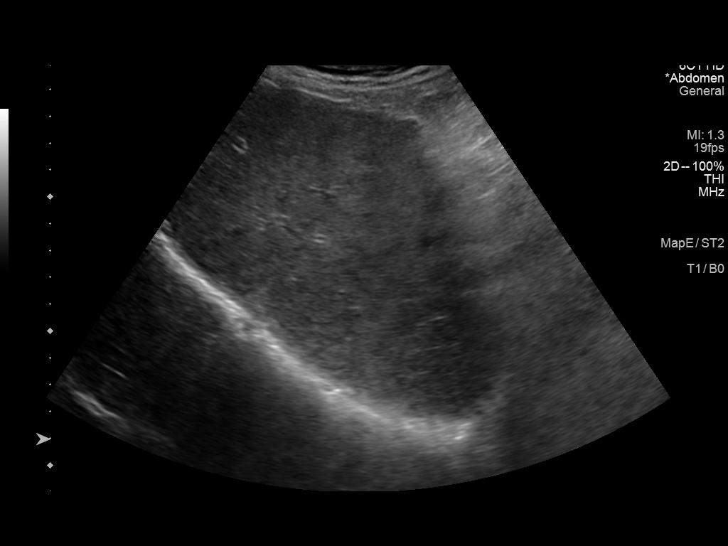
[im 32/52]
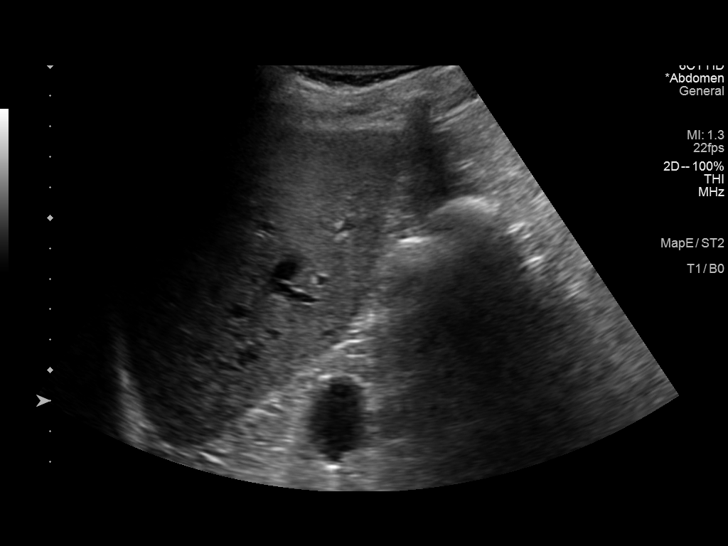
[im 35/52]
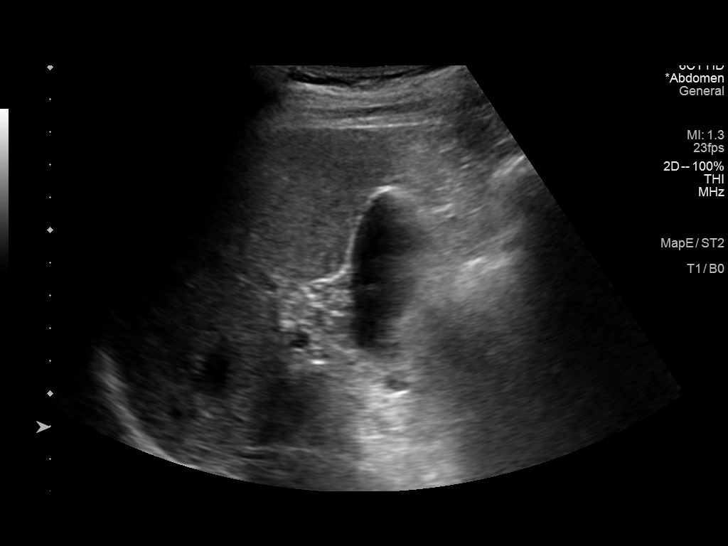
[im 39/52]
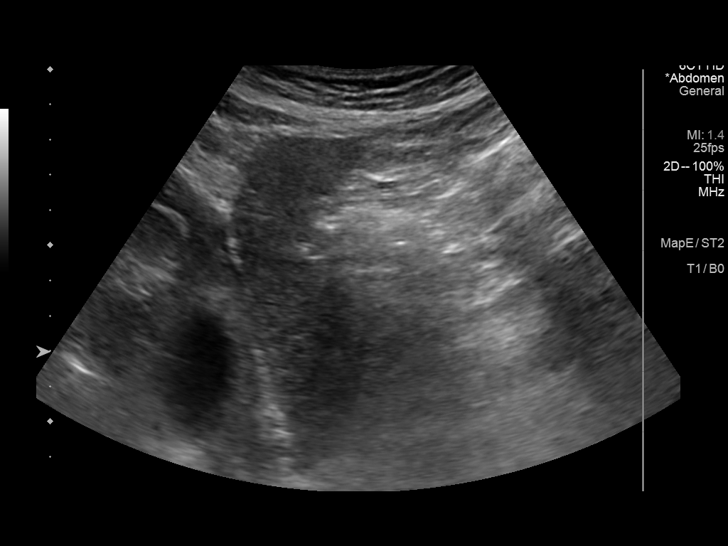
[im 43/52]
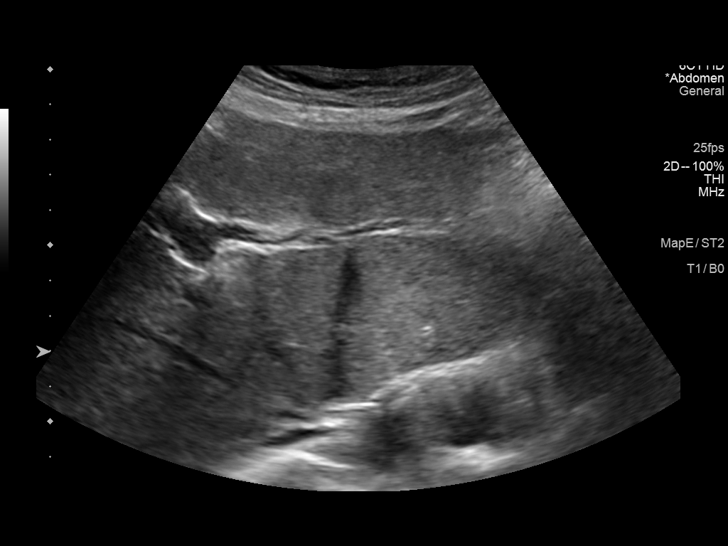
[im 47/52]
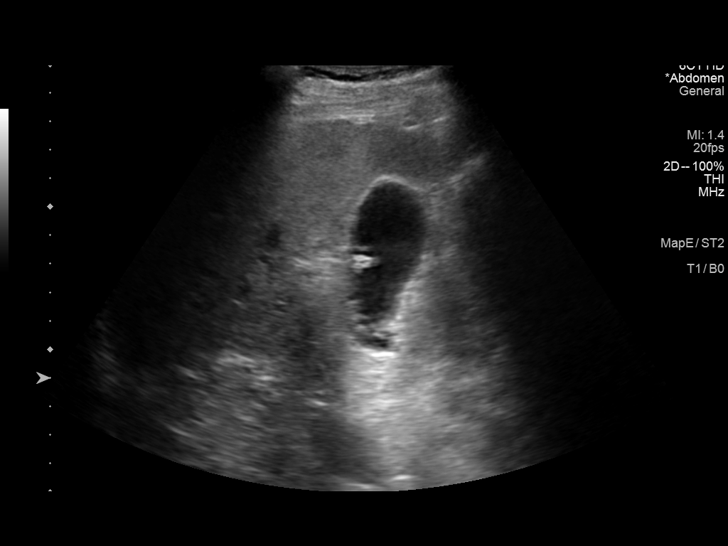
[im 52/52]
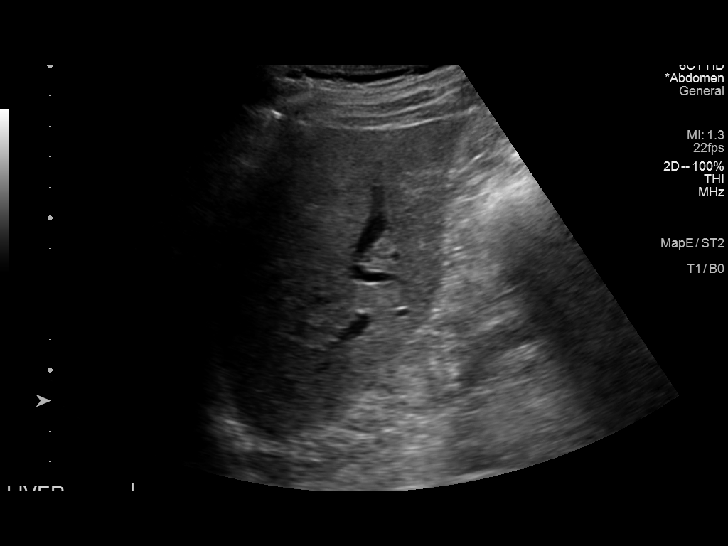

[14 of 25 positions shown; findings below may reference images not displayed]

FINDINGS: Gallbladder:

Multiple gallstones are noted with the largest measuring 8 mm. No
gallbladder wall thickening or pericholecystic fluid is noted. No
sonographic Murphy's sign is noted.

Common bile duct:

Diameter: 4 mm which is within normal limits.

Liver:

No focal lesion identified. Within normal limits in parenchymal
echogenicity. Portal vein is patent on color Doppler imaging with
normal direction of blood flow towards the liver.

Other: None.
IMPRESSION: Cholelithiasis without evidence of cholecystitis. No other
abnormality seen in the right upper quadrant of the abdomen.

## 2021-12-24 NOTE — H&P (Signed)
Patient's anticipated LOS is less than 2 midnights, meeting these requirements: - Younger than 42 - Lives within 1 hour of care - Has a competent adult at home to recover with post-op recover - NO history of  - Chronic pain requiring opiods  - Diabetes  - Coronary Artery Disease  - Heart failure  - Heart attack  - Stroke  - DVT/VTE  - Cardiac arrhythmia  - Respiratory Failure/COPD  - Renal failure  - Anemia  - Advanced Liver disease     Scott [name] is an 86 y.o. male.    Chief Complaint: right knee pain  HPI: Pt is a 86 y.o. male complaining of right knee pain for multiple years. Pain had continually increased since the beginning. X-rays in the clinic show end-stage arthritic changes of the right knee. Pt has tried various conservative treatments which have failed to alleviate their symptoms, including injections and therapy. Various options are discussed with the patient. Risks, benefits and expectations were discussed with the patient. Patient understand the risks, benefits and expectations and wishes to proceed with surgery.   PCP:  Janie Morning, DO  D/C Plans: Home  PMH: Past Medical History:  Diagnosis Date   Acid reflux    history of Barretts esophagus    Adenomatous colon polyp 01/2002   Arthritis 2004   Coronary artery disease 2007   cardiac cath w/ angioplasty & stent placement x 1   Enlarged prostate    Hyperlipidemia    Hypertension    Myocardial infarction ([place]) 2007   while vacationing in [place], cath. done there & x1 stent placed    Neuromuscular disorder ([place])    carpal tunnel - both hands    Prostate cancer ([place]) 2013   biopsy- active survelliance, annual biopsies    Sepsis secondary to UTI [facility])    Shortness of breath dyspnea    Sleep apnea 2001   Venous insufficiency     PSH: Past Surgical History:  Procedure Laterality Date   ANGIOPLASTY  09/18/2005   1 stent placed   [place] BIOPSY  12/30/2000   ROTATOR CUFF REPAIR   2004   right   TONSILLECTOMY AND ADENOIDECTOMY  1950   TOTAL KNEE ARTHROPLASTY Left 07/10/2016   Procedure: TOTAL KNEE ARTHROPLASTY;  Surgeon: Netta Cedars, MD;  Location: [facility];  Service: Orthopedics;  Laterality: Left;   VASECTOMY  1970    Social History:  reports that he has never smoked. He has never used smokeless tobacco. He reports that he does not drink alcohol and does not use drugs. BMI: Estimated body mass index is 27.21 kg/m as calculated from the following:   Height as of 09/15/21: '5\' 6"'$  (1.676 m).   Weight as of 09/15/21: 76.5 kg.  Lab Results  Component Value Date   ALBUMIN 3.8 01/29/2016   Diabetes: Patient does not have a diagnosis of diabetes.     Smoking Status:   reports that he has never smoked. He has never used smokeless tobacco.    Allergies:  Allergies  Allergen Reactions   Shellfish Allergy Nausea And Vomiting   Sulfa Antibiotics Other (See Comments)    Feels fatigued and weak when taking sulfa for long durations     Medications: No current facility-administered medications for this encounter.   Current Outpatient Medications  Medication Sig Dispense Refill   alfuzosin (UROXATRAL) 10 MG 24 hr tablet Take 10 mg by mouth at bedtime.     Ascorbic Acid (VITAMIN  C) 500 MG CAPS      aspirin 81 MG tablet Take 81 mg by mouth at bedtime.      atorvastatin (LIPITOR) 40 MG tablet Take 40 mg by mouth every evening.     cetirizine (ZYRTEC) 10 MG tablet Take 10 mg by mouth daily before breakfast.      COVID-19 mRNA bivalent vaccine, Pfizer, injection Inject into the muscle. 0.3 mL 0   eplerenone (INSPRA) 25 MG tablet TAKE 1 TABLET BY MOUTH IN  THE MORNING 90 tablet 3   ezetimibe (ZETIA) 10 MG tablet TAKE 1 TABLET BY MOUTH  DAILY AFTER SUPPER 90 tablet 3   ferrous sulfate 325 (65 FE) MG EC tablet Take 325 mg by mouth every evening.      finasteride (PROSCAR) 5 MG tablet Take 5 mg by mouth every evening.      fluticasone (FLONASE) 50 MCG/ACT nasal spray Place  into both nostrils daily.     fluticasone-salmeterol (ADVAIR HFA) 115-21 MCG/ACT inhaler Inhale 2 puffs into the lungs 2 (two) times daily. 3 each 2   gabapentin (NEURONTIN) 300 MG capsule Take 300 mg by mouth 2 (two) times daily.     ketoconazole (NIZORAL) 2 % cream Apply 1 application topically 2 (two) times daily as needed.     montelukast (SINGULAIR) 10 MG tablet Take 10 mg by mouth every morning.     Multiple Vitamin (MULTIVITAMIN) tablet Take 1 tablet by mouth at bedtime.      oxybutynin (DITROPAN) 5 MG tablet Take 5 mg by mouth 2 (two) times daily.     pantoprazole (PROTONIX) 20 MG tablet Take 40 mg by mouth daily before breakfast.       No results found for this or any previous visit (from the past 48 hour(s)). No results found.  ROS: Pain with rom of the right lower extremity  Physical Exam: Alert and oriented 86 y.o. male in no acute distress Cranial nerves 2-12 intact Cervical spine: full rom with no tenderness, nv intact distally Chest: active breath sounds bilaterally, no wheeze rhonchi or rales Heart: regular rate and rhythm, no murmur Abd: non tender non distended with active bowel sounds Hip is stable with rom  Right knee painful rom with crepitus Nv intact distally No rashes or edema distally Antalgic gait  Assessment/Plan Assessment: right knee end stage ostoearthritis  Plan:  Patient will undergo a right total knee by Dr. Veverly Fells at [place] Risks benefits and expectations were discussed with the patient. Patient understand risks, benefits and expectations and wishes to proceed. Preoperative templating of the joint replacement has been completed, documented, and submitted to the Operating Room personnel in order to optimize intra-operative equipment management.   Merla Riches PA-C, MPAS [facility] is now [employer] [address] [place], [place], [place] [address] Phone: [phone] [email or web address]   [employer]

## 2021-12-26 NOTE — Patient Instructions (Addendum)
DUE TO COVID-19 ONLY TWO VISITORS  (aged 84 and older)  ARE ALLOWED TO COME WITH YOU AND STAY IN THE WAITING ROOM ONLY DURING PRE OP AND PROCEDURE.   **NO VISITORS ARE ALLOWED IN THE SHORT STAY AREA OR RECOVERY ROOM!!**  IF YOU WILL BE ADMITTED INTO THE HOSPITAL YOU ARE ALLOWED ONLY FOUR SUPPORT PEOPLE DURING VISITATION HOURS ONLY (7 AM -8PM)   The support person(s) must pass our screening, gel in and out, and wear a mask at all times, including in the patient's room. Patients must also wear a mask when staff or their support person are in the room. Visitors GUEST BADGE MUST BE WORN VISIBLY  One adult visitor may remain with you overnight and MUST be in the room by 8 P.M.     Your procedure is scheduled on: 01/09/22   Report to [facility] Main Entrance    Report to admitting at 7:05 AM   Call this number if you have problems the morning of surgery [phone]   Do not eat food :After Midnight.   After Midnight you may have the following liquids until _6:30_ AM/  DAY OF SURGERY  Water Black Coffee (sugar ok, NO MILK/CREAM OR CREAMERS)  Tea (sugar ok, NO MILK/CREAM OR CREAMERS) regular and decaf                             Plain Jell-O (NO RED)                                           Fruit ices (not with fruit pulp, NO RED)                                     Popsicles (NO RED)                                                                  Juice: apple, WHITE grape, WHITE cranberry Sports drinks like Gatorade (NO RED) Clear broth(vegetable,chicken,beef)                    The day of surgery:  Drink ONE (1) Pre-Surgery Clear Ensure  at 6:15 AM the morning of surgery. Drink in one sitting. Do not sip.  This drink was given to you during your hospital  pre-op appointment visit. Nothing else to drink after completing the  Pre-Surgery Clear Ensure at 6:30 AM          If you have questions, please contact your surgeon's office.       Oral Hygiene is also important  to reduce your risk of infection.                                    Remember - BRUSH YOUR TEETH THE MORNING OF SURGERY WITH YOUR REGULAR TOOTHPASTE   Do NOT smoke after Midnight   Take these medicines the morning of surgery with A SIP OF WATER: Gabapentin,  Zyrtec, Pantoprazole, Oxybutynin.                  Use your inhalers and bring them with you.   Before surgery.Stop taking __ASA 81 mg_________on __________as instructed by _____________.  Stop taking ____________as directed by your Surgeon/Cardiologist.  Contact your Surgeon/Cardiologist for instructions on Anticoagulant Therapy prior to surgery.    Bring CPAP mask and tubing day of surgery.                              You may not have any metal on your body including , jewelry, and body piercing             Do not wear lotions, powders, perfumes/cologne, or deodorant                 Men may shave face and neck.   Do not bring valuables to the hospital. [place].   Contacts, dentures or bridgework may not be worn into surgery.   Bring small overnight bag day of surgery.     Special Instructions: Bring a copy of your healthcare power of attorney and living will documents  the day of surgery if you haven't scanned them before.              Please read over the following fact sheets you were given: IF YOU HAVE QUESTIONS ABOUT YOUR PRE-OP INSTRUCTIONS PLEASE CALL [phone]     [facility] - Preparing for Surgery Before surgery, you can play an important role.  Because skin is not sterile, your skin needs to be as free of germs as possible.  You can reduce the number of germs on your skin by washing with CHG (chlorahexidine gluconate) soap before surgery.  CHG is an antiseptic cleaner which kills germs and bonds with the skin to continue killing germs even after washing. Please DO NOT use if you have an allergy to CHG or antibacterial soaps.  If your skin becomes  reddened/irritated stop using the CHG and inform your nurse when you arrive at Short Stay. You may shave your face/neck. Please follow these instructions carefully:  1.  Shower with CHG Soap the night before surgery and the  morning of Surgery.  2.  If you choose to wash your hair, wash your hair first as usual with your  normal  shampoo.  3.  After you shampoo, rinse your hair and body thoroughly to remove the  shampoo.                            4.  Use CHG as you would any other liquid soap.  You can apply chg directly  to the skin and wash                       Gently with a scrungie or clean washcloth.  5.  Apply the CHG Soap to your body ONLY FROM THE NECK DOWN.   Do not use on face/ open                           Wound or open sores. Avoid contact with eyes, ears mouth and genitals (private parts).  Wash face,  Genitals (private parts) with your normal soap.             6.  Wash thoroughly, paying special attention to the area where your surgery  will be performed.  7.  Thoroughly rinse your body with warm water from the neck down.  8.  DO NOT shower/wash with your normal soap after using and rinsing off  the CHG Soap.                9.  Pat yourself dry with a clean towel.            10.  Wear clean pajamas.            11.  Place clean sheets on your bed the night of your first shower and do not  sleep with pets. Day of Surgery : Do not apply any lotions/deodorants the morning of surgery.  Please wear clean clothes to the hospital/surgery center.  FAILURE TO FOLLOW THESE INSTRUCTIONS MAY RESULT IN THE CANCELLATION OF YOUR SURGERY   ________________________________________________________________________   Incentive Spirometer  An incentive spirometer is a tool that can help keep your lungs clear and active. This tool measures how well you are filling your lungs with each breath. Taking long deep breaths may help reverse or decrease the chance of developing  breathing (pulmonary) problems (especially infection) following: A long period of time when you are unable to move or be active. BEFORE THE PROCEDURE  If the spirometer includes an indicator to show your best effort, your nurse or respiratory therapist will set it to a desired goal. If possible, sit up straight or lean slightly forward. Try not to slouch. Hold the incentive spirometer in an upright position. INSTRUCTIONS FOR USE  Sit on the edge of your bed if possible, or sit up as far as you can in bed or on a chair. Hold the incentive spirometer in an upright position. Breathe out normally. Place the mouthpiece in your mouth and seal your lips tightly around it. Breathe in slowly and as deeply as possible, raising the piston or the ball toward the top of the column. Hold your breath for 3-5 seconds or for as long as possible. Allow the piston or ball to fall to the bottom of the column. Remove the mouthpiece from your mouth and breathe out normally. Rest for a few seconds and repeat Steps 1 through 7 at least 10 times every 1-2 hours when you are awake. Take your time and take a few normal breaths between deep breaths. The spirometer may include an indicator to show your best effort. Use the indicator as a goal to work toward during each repetition. After each set of 10 deep breaths, practice coughing to be sure your lungs are clear. If you have an incision (the cut made at the time of surgery), support your incision when coughing by placing a pillow or rolled up towels firmly against it. Once you are able to get out of bed, walk around indoors and cough well. You may stop using the incentive spirometer when instructed by your caregiver.  RISKS AND COMPLICATIONS Take your time so you do not get dizzy or light-headed. If you are in pain, you may need to take or ask for pain medication before doing incentive spirometry. It is harder to take a deep breath if you are having pain. AFTER USE Rest  and breathe slowly and easily. It can be helpful to keep track of a log of  your progress. Your caregiver can provide you with a simple table to help with this. If you are using the spirometer at home, follow these instructions: [place] IF:  You are having difficultly using the spirometer. You have trouble using the spirometer as often as instructed. Your pain medication is not giving enough relief while using the spirometer. You develop fever of 100.5 F (38.1 C) or higher. SEEK IMMEDIATE MEDICAL CARE IF:  You cough up bloody sputum that had not been present before. You develop fever of 102 F (38.9 C) or greater. You develop worsening pain at or near the incision site. MAKE SURE YOU:  Understand these instructions. Will watch your condition. Will get help right away if you are not doing well or get worse. Document Released: 12/07/2006 Document Revised: 10/19/2011 Document Reviewed: 02/07/2007 [facility] Patient Information 2014 [place], [place].   ________________________________________________________________________

## 2021-12-29 ENCOUNTER — Other Ambulatory Visit: Payer: Self-pay

## 2021-12-29 ENCOUNTER — Encounter (HOSPITAL_COMMUNITY): Payer: Self-pay

## 2021-12-29 ENCOUNTER — Encounter (HOSPITAL_COMMUNITY)
Admission: RE | Admit: 2021-12-29 | Discharge: 2021-12-29 | Disposition: A | Discharge status: Home / Self Care | Payer: Medicare Other | Source: Ambulatory Visit | Attending: Orthopedic Surgery | Admitting: Orthopedic Surgery

## 2021-12-29 VITALS — BP 143/66 | HR 56 | Temp 98.4°F | Resp 20 | Ht 64.0 in | Wt 168.0 lb

## 2021-12-29 DIAGNOSIS — Z79899 Other long term (current) drug therapy: Secondary | ICD-10-CM | POA: Insufficient documentation

## 2021-12-29 DIAGNOSIS — Z01812 Encounter for preprocedural laboratory examination: Secondary | ICD-10-CM | POA: Insufficient documentation

## 2021-12-29 DIAGNOSIS — Z01818 Encounter for other preprocedural examination: Secondary | ICD-10-CM

## 2021-12-29 LAB — BASIC METABOLIC PANEL
Anion gap: 6 (ref 5–15)
BUN: 32 mg/dL — ABNORMAL HIGH (ref 8–23)
CO2: 23 mmol/L (ref 22–32)
Calcium: 9 mg/dL (ref 8.9–10.3)
Chloride: 110 mmol/L (ref 98–111)
Creatinine, Ser: 1.12 mg/dL (ref 0.61–1.24)
GFR, Estimated: >60 mL/min (ref >60)
Glucose, Bld: 121 mg/dL — ABNORMAL HIGH (ref 70–99)
Potassium: 4.5 mmol/L (ref 3.5–5.1)
Sodium: 139 mmol/L (ref 135–145)

## 2021-12-29 LAB — CBC
HCT: 40.4 % (ref 39.0–52.0)
Hemoglobin: 13.3 g/dL (ref 13.0–17.0)
MCH: 32.4 pg (ref 26.0–34.0)
MCHC: 32.9 g/dL (ref 30.0–36.0)
MCV: 98.3 fL (ref 80.0–100.0)
Platelets: 165 10*3/uL (ref 150–400)
RBC: 4.11 MIL/uL — ABNORMAL LOW (ref 4.22–5.81)
RDW: 13 % (ref 11.5–15.5)
WBC: 9.4 10*3/uL (ref 4.0–10.5)
nRBC: 0 % (ref 0.0–0.2)

## 2021-12-29 LAB — SURGICAL PCR SCREEN
MRSA, PCR: NEGATIVE
Staphylococcus aureus: NEGATIVE

## 2021-12-29 NOTE — Progress Notes (Signed)
Anesthesia note:  Bowel prep reminder:NA  PCP - Dr. Mady Gemma Cardiologist -Dr. Christen Butter Other-   Chest x-ray - 03/24/21-epic EKG - 09/15/21-epic Stress Test - 02/13/20-epic ECHO - 02/07/20-epic Cardiac Cath - with stent 2007  Pacemaker/ICD device last checked:NA  Sleep Study - yes CPAP - yes  Pt is pre diabetic-NA Fasting Blood Sugar -  Checks Blood Sugar _____  Blood Thinner:ASA 81 mg/ Dr. Einar Gip Blood Thinner Instructions: Aspirin Instructions:continue Last Dose:  Anesthesia review: yes  Patient denies shortness of breath, fever, cough and chest pain at PAT appointment Pt gives a history of SOB but reports none doing housework or with ADLs. He doesn't climb stairs and walks slowly. He has been caring for his wife.  Patient verbalized understanding of instructions that were given to them at the PAT appointment. Patient was also instructed that they will need to review over the PAT instructions again at home before surgery. yes

## 2022-01-09 ENCOUNTER — Other Ambulatory Visit: Payer: Self-pay

## 2022-01-09 ENCOUNTER — Ambulatory Visit (HOSPITAL_BASED_OUTPATIENT_CLINIC_OR_DEPARTMENT_OTHER): Payer: Medicare Other | Admitting: Anesthesiology

## 2022-01-09 ENCOUNTER — Ambulatory Visit (HOSPITAL_COMMUNITY): Payer: Medicare Other | Admitting: Physician Assistant

## 2022-01-09 ENCOUNTER — Inpatient Hospital Stay (HOSPITAL_COMMUNITY)
Admission: RE | Admit: 2022-01-09 | Discharge: 2022-01-11 | DRG: 470 | Disposition: A | Discharge status: Home / Self Care | Payer: Medicare Other | Source: Ambulatory Visit | Attending: Orthopedic Surgery | Admitting: Orthopedic Surgery

## 2022-01-09 ENCOUNTER — Encounter (HOSPITAL_COMMUNITY): Payer: Self-pay | Admitting: Orthopedic Surgery

## 2022-01-09 ENCOUNTER — Encounter (HOSPITAL_COMMUNITY)
Admission: RE | Disposition: A | Discharge status: Home / Self Care | Payer: Self-pay | Source: Ambulatory Visit | Attending: Orthopedic Surgery

## 2022-01-09 DIAGNOSIS — I10 Essential (primary) hypertension: Secondary | ICD-10-CM | POA: Diagnosis present

## 2022-01-09 DIAGNOSIS — I2510 Atherosclerotic heart disease of native coronary artery without angina pectoris: Secondary | ICD-10-CM

## 2022-01-09 DIAGNOSIS — Z86010 Personal history of colonic polyps: Secondary | ICD-10-CM | POA: Diagnosis not present

## 2022-01-09 DIAGNOSIS — Z96651 Presence of right artificial knee joint: Principal | ICD-10-CM

## 2022-01-09 DIAGNOSIS — N400 Enlarged prostate without lower urinary tract symptoms: Secondary | ICD-10-CM | POA: Diagnosis present

## 2022-01-09 DIAGNOSIS — I872 Venous insufficiency (chronic) (peripheral): Secondary | ICD-10-CM | POA: Diagnosis present

## 2022-01-09 DIAGNOSIS — M1711 Unilateral primary osteoarthritis, right knee: Secondary | ICD-10-CM

## 2022-01-09 DIAGNOSIS — I252 Old myocardial infarction: Secondary | ICD-10-CM

## 2022-01-09 DIAGNOSIS — Z96652 Presence of left artificial knee joint: Secondary | ICD-10-CM | POA: Diagnosis present

## 2022-01-09 DIAGNOSIS — K2270 Barrett's esophagus without dysplasia: Secondary | ICD-10-CM | POA: Diagnosis present

## 2022-01-09 DIAGNOSIS — Z7982 Long term (current) use of aspirin: Secondary | ICD-10-CM | POA: Diagnosis not present

## 2022-01-09 DIAGNOSIS — Z882 Allergy status to sulfonamides status: Secondary | ICD-10-CM | POA: Diagnosis not present

## 2022-01-09 DIAGNOSIS — Z8546 Personal history of malignant neoplasm of prostate: Secondary | ICD-10-CM | POA: Diagnosis not present

## 2022-01-09 DIAGNOSIS — E785 Hyperlipidemia, unspecified: Secondary | ICD-10-CM | POA: Diagnosis present

## 2022-01-09 DIAGNOSIS — Z91013 Allergy to seafood: Secondary | ICD-10-CM

## 2022-01-09 DIAGNOSIS — K219 Gastro-esophageal reflux disease without esophagitis: Secondary | ICD-10-CM | POA: Diagnosis present

## 2022-01-09 DIAGNOSIS — Z79899 Other long term (current) drug therapy: Secondary | ICD-10-CM | POA: Diagnosis not present

## 2022-01-09 DIAGNOSIS — M25561 Pain in right knee: Secondary | ICD-10-CM | POA: Diagnosis present

## 2022-01-09 DIAGNOSIS — Z955 Presence of coronary angioplasty implant and graft: Secondary | ICD-10-CM | POA: Diagnosis not present

## 2022-01-09 HISTORY — PX: TOTAL KNEE ARTHROPLASTY: SHX125

## 2022-01-09 SURGERY — ARTHROPLASTY, KNEE, TOTAL
Anesthesia: Spinal | Site: Knee | Laterality: Right

## 2022-01-09 MED ORDER — POLYETHYLENE GLYCOL 3350 17 G PO PACK: 17.0000 g | PACK | Freq: Every day | ORAL | Status: DC | PRN

## 2022-01-09 MED ORDER — ONDANSETRON HCL 4 MG PO TABS: 4.0000 mg | ORAL_TABLET | Freq: Four times a day (QID) | ORAL | Status: DC | PRN

## 2022-01-09 MED ORDER — GABAPENTIN 300 MG PO CAPS
300.0000 mg | ORAL_CAPSULE | Freq: Two times a day (BID) | ORAL | Status: DC
  Administered 2022-01-09 – 2022-01-11 (×4): 300 mg via ORAL
  Filled 2022-01-09 (×4): qty 1

## 2022-01-09 MED ORDER — BUPIVACAINE-EPINEPHRINE 0.25% -1:200000 IJ SOLN
INTRAMUSCULAR | Status: DC | PRN
  Administered 2022-01-09: 30 mL

## 2022-01-09 MED ORDER — BUPIVACAINE LIPOSOME 1.3 % IJ SUSP: 20.0000 mL | Freq: Once | INTRAMUSCULAR | Status: DC

## 2022-01-09 MED ORDER — MOMETASONE FURO-FORMOTEROL FUM 200-5 MCG/ACT IN AERO
2.0000 | INHALATION_SPRAY | Freq: Two times a day (BID) | RESPIRATORY_TRACT | Status: DC
  Filled 2022-01-09: qty 8.8

## 2022-01-09 MED ORDER — MONTELUKAST SODIUM 10 MG PO TABS
10.0000 mg | ORAL_TABLET | Freq: Every day | ORAL | Status: DC
  Administered 2022-01-10 – 2022-01-11 (×2): 10 mg via ORAL
  Filled 2022-01-09 (×2): qty 1

## 2022-01-09 MED ORDER — ASPIRIN 81 MG PO CHEW
81.0000 mg | CHEWABLE_TABLET | Freq: Two times a day (BID) | ORAL | Status: DC
  Administered 2022-01-09 – 2022-01-11 (×4): 81 mg via ORAL
  Filled 2022-01-09 (×4): qty 1

## 2022-01-09 MED ORDER — FLUTICASONE PROPIONATE 0.05 % EX CREA
1.0000 | TOPICAL_CREAM | Freq: Two times a day (BID) | CUTANEOUS | Status: DC | PRN
Start: 2022-01-09 — End: 2022-01-11

## 2022-01-09 MED ORDER — TIZANIDINE HCL 2 MG PO TABS: 2.0000 mg | ORAL_TABLET | Freq: Three times a day (TID) | ORAL | 0 refills | Status: DC | PRN

## 2022-01-09 MED ORDER — 0.9 % SODIUM CHLORIDE (POUR BTL) OPTIME
TOPICAL | Status: DC | PRN
  Administered 2022-01-09: 1000 mL

## 2022-01-09 MED ORDER — ALFUZOSIN HCL ER 10 MG PO TB24
10.0000 mg | ORAL_TABLET | Freq: Every day | ORAL | Status: DC
  Administered 2022-01-09 – 2022-01-10 (×2): 10 mg via ORAL
  Filled 2022-01-09 (×2): qty 1

## 2022-01-09 MED ORDER — ASPIRIN 81 MG PO TBEC: 81.0000 mg | DELAYED_RELEASE_TABLET | Freq: Two times a day (BID) | ORAL | 0 refills | Status: AC

## 2022-01-09 MED ORDER — ONDANSETRON HCL 4 MG PO TABS: 4.0000 mg | ORAL_TABLET | Freq: Three times a day (TID) | ORAL | 1 refills | Status: DC | PRN

## 2022-01-09 MED ORDER — HYDROMORPHONE HCL 1 MG/ML IJ SOLN: 0.5000 mg | INTRAMUSCULAR | Status: DC | PRN

## 2022-01-09 MED ORDER — PROPOFOL 10 MG/ML IV BOLUS
INTRAVENOUS | Status: AC
  Filled 2022-01-09: qty 20

## 2022-01-09 MED ORDER — MENTHOL 3 MG MT LOZG
1.0000 | LOZENGE | OROMUCOSAL | Status: DC | PRN
Start: 2022-01-09 — End: 2022-01-11

## 2022-01-09 MED ORDER — TRANEXAMIC ACID-NACL 1000-0.7 MG/100ML-% IV SOLN
1000.0000 mg | INTRAVENOUS | Status: AC
  Administered 2022-01-09: 1000 mg via INTRAVENOUS
  Filled 2022-01-09: qty 100

## 2022-01-09 MED ORDER — DOCUSATE SODIUM 100 MG PO CAPS
100.0000 mg | ORAL_CAPSULE | Freq: Two times a day (BID) | ORAL | Status: DC
  Administered 2022-01-09 – 2022-01-11 (×5): 100 mg via ORAL
  Filled 2022-01-09 (×5): qty 1

## 2022-01-09 MED ORDER — ACETAMINOPHEN 325 MG PO TABS
325.0000 mg | ORAL_TABLET | Freq: Four times a day (QID) | ORAL | Status: DC | PRN
  Administered 2022-01-09 – 2022-01-10 (×2): 650 mg via ORAL
  Filled 2022-01-09 (×2): qty 2

## 2022-01-09 MED ORDER — BUPIVACAINE IN DEXTROSE 0.75-8.25 % IT SOLN
INTRATHECAL | Status: DC | PRN
  Administered 2022-01-09: 1.6 mL via INTRATHECAL

## 2022-01-09 MED ORDER — ORAL CARE MOUTH RINSE: 15.0000 mL | Freq: Once | OROMUCOSAL | Status: AC

## 2022-01-09 MED ORDER — OXYCODONE-ACETAMINOPHEN 5-325 MG PO TABS: 1.0000 | ORAL_TABLET | ORAL | 0 refills | Status: DC | PRN

## 2022-01-09 MED ORDER — LACTATED RINGERS IV SOLN: INTRAVENOUS | Status: DC

## 2022-01-09 MED ORDER — EZETIMIBE 10 MG PO TABS
10.0000 mg | ORAL_TABLET | Freq: Every day | ORAL | Status: DC
  Administered 2022-01-09 – 2022-01-10 (×2): 10 mg via ORAL
  Filled 2022-01-09 (×2): qty 1

## 2022-01-09 MED ORDER — WATER FOR IRRIGATION, STERILE IR SOLN
Status: DC | PRN
  Administered 2022-01-09: 1000 mL

## 2022-01-09 MED ORDER — KETOCONAZOLE 2 % EX CREA
1.0000 | TOPICAL_CREAM | Freq: Two times a day (BID) | CUTANEOUS | Status: DC | PRN
Start: 2022-01-09 — End: 2022-01-11

## 2022-01-09 MED ORDER — CEFAZOLIN SODIUM-DEXTROSE 2-4 GM/100ML-% IV SOLN
2.0000 g | INTRAVENOUS | Status: AC
  Administered 2022-01-09: 2 g via INTRAVENOUS
  Filled 2022-01-09: qty 100

## 2022-01-09 MED ORDER — BUPIVACAINE LIPOSOME 1.3 % IJ SUSP
INTRAMUSCULAR | Status: DC | PRN
  Administered 2022-01-09: 20 mL

## 2022-01-09 MED ORDER — TIZANIDINE HCL 4 MG PO TABS
2.0000 mg | ORAL_TABLET | Freq: Three times a day (TID) | ORAL | Status: DC | PRN
  Administered 2022-01-11: 2 mg via ORAL
  Filled 2022-01-09: qty 1

## 2022-01-09 MED ORDER — FENTANYL CITRATE PF 50 MCG/ML IJ SOSY
50.0000 ug | PREFILLED_SYRINGE | INTRAMUSCULAR | Status: DC
  Administered 2022-01-09: 50 ug via INTRAVENOUS
  Filled 2022-01-09: qty 2

## 2022-01-09 MED ORDER — TRANEXAMIC ACID-NACL 1000-0.7 MG/100ML-% IV SOLN
1000.0000 mg | Freq: Once | INTRAVENOUS | Status: AC
  Administered 2022-01-09: 1000 mg via INTRAVENOUS
  Filled 2022-01-09: qty 100

## 2022-01-09 MED ORDER — PROPOFOL 500 MG/50ML IV EMUL
INTRAVENOUS | Status: DC | PRN
  Administered 2022-01-09: 75 ug/kg/min via INTRAVENOUS

## 2022-01-09 MED ORDER — SPIRONOLACTONE 25 MG PO TABS
25.0000 mg | ORAL_TABLET | Freq: Every day | ORAL | Status: DC
  Administered 2022-01-10 – 2022-01-11 (×2): 25 mg via ORAL
  Filled 2022-01-09 (×2): qty 1

## 2022-01-09 MED ORDER — PANTOPRAZOLE SODIUM 20 MG PO TBEC
20.0000 mg | DELAYED_RELEASE_TABLET | Freq: Every day | ORAL | Status: DC
  Administered 2022-01-10 – 2022-01-11 (×2): 20 mg via ORAL
  Filled 2022-01-09 (×2): qty 1

## 2022-01-09 MED ORDER — DEXAMETHASONE SODIUM PHOSPHATE 10 MG/ML IJ SOLN
INTRAMUSCULAR | Status: DC | PRN
  Administered 2022-01-09: 8 mg via INTRAVENOUS

## 2022-01-09 MED ORDER — CLONIDINE HCL (ANALGESIA) 100 MCG/ML EP SOLN
EPIDURAL | Status: DC | PRN
  Administered 2022-01-09: 50 ug

## 2022-01-09 MED ORDER — ATORVASTATIN CALCIUM 40 MG PO TABS
40.0000 mg | ORAL_TABLET | Freq: Every day | ORAL | Status: DC
  Administered 2022-01-09 – 2022-01-10 (×2): 40 mg via ORAL
  Filled 2022-01-09 (×2): qty 1

## 2022-01-09 MED ORDER — SODIUM CHLORIDE (PF) 0.9 % IJ SOLN
INTRAMUSCULAR | Status: DC | PRN
  Administered 2022-01-09: 30 mL

## 2022-01-09 MED ORDER — OXYBUTYNIN CHLORIDE 5 MG PO TABS
5.0000 mg | ORAL_TABLET | Freq: Two times a day (BID) | ORAL | Status: DC
  Administered 2022-01-09 – 2022-01-11 (×4): 5 mg via ORAL
  Filled 2022-01-09 (×4): qty 1

## 2022-01-09 MED ORDER — PROPOFOL 10 MG/ML IV BOLUS
INTRAVENOUS | Status: DC | PRN
  Administered 2022-01-09: 20 mg via INTRAVENOUS

## 2022-01-09 MED ORDER — SODIUM CHLORIDE 0.9 % IR SOLN
Status: DC | PRN
  Administered 2022-01-09: 1000 mL

## 2022-01-09 MED ORDER — GLYCOPYRROLATE 0.2 MG/ML IJ SOLN
INTRAMUSCULAR | Status: DC | PRN
  Administered 2022-01-09: .2 mg via INTRAVENOUS

## 2022-01-09 MED ORDER — FLUTICASONE PROPIONATE 50 MCG/ACT NA SUSP
1.0000 | Freq: Every day | NASAL | Status: DC
  Administered 2022-01-10 – 2022-01-11 (×2): 1 via NASAL
  Filled 2022-01-09: qty 16

## 2022-01-09 MED ORDER — BUPIVACAINE-EPINEPHRINE (PF) 0.25% -1:200000 IJ SOLN
INTRAMUSCULAR | Status: AC
  Filled 2022-01-09: qty 30

## 2022-01-09 MED ORDER — FINASTERIDE 5 MG PO TABS
5.0000 mg | ORAL_TABLET | Freq: Every day | ORAL | Status: DC
  Administered 2022-01-09 – 2022-01-10 (×2): 5 mg via ORAL
  Filled 2022-01-09 (×2): qty 1

## 2022-01-09 MED ORDER — PHENYLEPHRINE HCL-NACL 20-0.9 MG/250ML-% IV SOLN
INTRAVENOUS | Status: DC | PRN
  Administered 2022-01-09: 50 ug/min via INTRAVENOUS

## 2022-01-09 MED ORDER — ROPIVACAINE HCL 5 MG/ML IJ SOLN
INTRAMUSCULAR | Status: DC | PRN
  Administered 2022-01-09: 20 mL via PERINEURAL

## 2022-01-09 MED ORDER — CEFAZOLIN SODIUM-DEXTROSE 2-4 GM/100ML-% IV SOLN
2.0000 g | Freq: Four times a day (QID) | INTRAVENOUS | Status: AC
  Administered 2022-01-09 (×2): 2 g via INTRAVENOUS
  Filled 2022-01-09 (×2): qty 100

## 2022-01-09 MED ORDER — CHLORHEXIDINE GLUCONATE 0.12 % MT SOLN
15.0000 mL | Freq: Once | OROMUCOSAL | Status: AC
  Administered 2022-01-09: 15 mL via OROMUCOSAL

## 2022-01-09 MED ORDER — METOCLOPRAMIDE HCL 5 MG/ML IJ SOLN: 5.0000 mg | Freq: Three times a day (TID) | INTRAMUSCULAR | Status: DC | PRN

## 2022-01-09 MED ORDER — METOCLOPRAMIDE HCL 5 MG PO TABS: 5.0000 mg | ORAL_TABLET | Freq: Three times a day (TID) | ORAL | Status: DC | PRN

## 2022-01-09 MED ORDER — SODIUM CHLORIDE 0.9 % IV SOLN: INTRAVENOUS | Status: DC

## 2022-01-09 MED ORDER — PHENOL 1.4 % MT LIQD: 1.0000 | OROMUCOSAL | Status: DC | PRN

## 2022-01-09 MED ORDER — ONDANSETRON HCL 4 MG/2ML IJ SOLN
INTRAMUSCULAR | Status: DC | PRN
  Administered 2022-01-09: 4 mg via INTRAVENOUS

## 2022-01-09 MED ORDER — ONDANSETRON HCL 4 MG/2ML IJ SOLN: 4.0000 mg | Freq: Four times a day (QID) | INTRAMUSCULAR | Status: DC | PRN

## 2022-01-09 MED ORDER — ASCORBIC ACID 500 MG PO TABS
1000.0000 mg | ORAL_TABLET | Freq: Every day | ORAL | Status: DC
  Administered 2022-01-10 – 2022-01-11 (×2): 1000 mg via ORAL
  Filled 2022-01-09 (×2): qty 2

## 2022-01-09 MED ORDER — POVIDONE-IODINE 10 % EX SWAB
2.0000 "application " | Freq: Once | CUTANEOUS | Status: AC
  Administered 2022-01-09: 2 via TOPICAL

## 2022-01-09 MED ORDER — ADULT MULTIVITAMIN W/MINERALS CH
1.0000 | ORAL_TABLET | Freq: Every day | ORAL | Status: DC
  Administered 2022-01-09 – 2022-01-10 (×2): 1 via ORAL
  Filled 2022-01-09 (×2): qty 1

## 2022-01-09 MED ORDER — ACETAMINOPHEN 500 MG PO TABS
1000.0000 mg | ORAL_TABLET | Freq: Once | ORAL | Status: AC
  Administered 2022-01-09: 1000 mg via ORAL
  Filled 2022-01-09: qty 2

## 2022-01-09 MED ORDER — SODIUM CHLORIDE (PF) 0.9 % IJ SOLN
INTRAMUSCULAR | Status: AC
  Filled 2022-01-09: qty 30

## 2022-01-09 MED ORDER — OXYCODONE HCL 5 MG PO TABS
5.0000 mg | ORAL_TABLET | ORAL | Status: DC | PRN
  Administered 2022-01-09 – 2022-01-10 (×4): 5 mg via ORAL
  Administered 2022-01-10 – 2022-01-11 (×4): 10 mg via ORAL
  Filled 2022-01-09: qty 2
  Filled 2022-01-09: qty 1
  Filled 2022-01-09: qty 2
  Filled 2022-01-09: qty 1
  Filled 2022-01-09: qty 2
  Filled 2022-01-09: qty 1
  Filled 2022-01-09: qty 2
  Filled 2022-01-09: qty 1

## 2022-01-09 MED ORDER — PHENYLEPHRINE HCL (PRESSORS) 10 MG/ML IV SOLN
INTRAVENOUS | Status: AC
  Filled 2022-01-09: qty 1

## 2022-01-09 MED ORDER — BUPIVACAINE LIPOSOME 1.3 % IJ SUSP
INTRAMUSCULAR | Status: AC
  Filled 2022-01-09: qty 20

## 2022-01-09 MED ORDER — LORATADINE 10 MG PO TABS
10.0000 mg | ORAL_TABLET | Freq: Every day | ORAL | Status: DC
  Administered 2022-01-10 – 2022-01-11 (×2): 10 mg via ORAL
  Filled 2022-01-09 (×2): qty 1

## 2022-01-09 SURGICAL SUPPLY — 55 items
ATTUNE MED DOME PAT 38 KNEE (Knees) ×1 IMPLANT
ATTUNE PS FEM RT SZ 5 CEM KNEE (Femur) ×1 IMPLANT
ATTUNE PSRP INSR SZ 5 10M KNEE (Insert) ×1 IMPLANT
BAG COUNTER SPONGE SURGICOUNT (BAG) IMPLANT
BAG SPEC THK2 15X12 ZIP CLS (MISCELLANEOUS)
BAG SPNG CNTER NS LX DISP (BAG)
BAG ZIPLOCK 12X15 (MISCELLANEOUS) IMPLANT
BASE TIBIAL ROT PLAT SZ 5 KNEE (Knees) IMPLANT
BLADE SAG 18X100X1.27 (BLADE) ×2 IMPLANT
BLADE SAW SGTL 13X75X1.27 (BLADE) ×2 IMPLANT
BNDG CMPR MED 10X6 ELC LF (GAUZE/BANDAGES/DRESSINGS) ×1
BNDG ELASTIC 6X10 VLCR STRL LF (GAUZE/BANDAGES/DRESSINGS) ×2 IMPLANT
BNDG GAUZE ELAST 4 BULKY (GAUZE/BANDAGES/DRESSINGS) ×2 IMPLANT
BOWL SMART MIX CTS (DISPOSABLE) ×2 IMPLANT
BSPLAT TIB 5 CMNT ROT PLAT STR (Knees) ×1 IMPLANT
CEMENT HV SMART SET (Cement) ×4 IMPLANT
COVER SURGICAL LIGHT HANDLE (MISCELLANEOUS) ×2 IMPLANT
CUFF TOURN SGL QUICK 34 (TOURNIQUET CUFF) ×2
CUFF TRNQT CYL 34X4.125X (TOURNIQUET CUFF) ×1 IMPLANT
DRAPE INCISE IOBAN 66X45 STRL (DRAPES) ×2 IMPLANT
DRAPE SHEET LG 3/4 BI-LAMINATE (DRAPES) ×2 IMPLANT
DRAPE U-SHAPE 47X51 STRL (DRAPES) ×2 IMPLANT
DRSG ADAPTIC 3X8 NADH LF (GAUZE/BANDAGES/DRESSINGS) ×2 IMPLANT
DRSG PAD ABDOMINAL 8X10 ST (GAUZE/BANDAGES/DRESSINGS) ×2 IMPLANT
DURAPREP 26ML APPLICATOR (WOUND CARE) ×2 IMPLANT
ELECT REM PT RETURN 15FT ADLT (MISCELLANEOUS) ×2 IMPLANT
GAUZE SPONGE 4X4 12PLY STRL (GAUZE/BANDAGES/DRESSINGS) ×2 IMPLANT
GLOVE BIOGEL PI IND STRL 7.5 (GLOVE) ×1 IMPLANT
GLOVE BIOGEL PI IND STRL 8.5 (GLOVE) ×1 IMPLANT
GLOVE BIOGEL PI INDICATOR 7.5 (GLOVE) ×1
GLOVE BIOGEL PI INDICATOR 8.5 (GLOVE) ×1
GLOVE ORTHO TXT STRL SZ7.5 (GLOVE) ×2 IMPLANT
GLOVE SURG ORTHO 8.5 STRL (GLOVE) ×4 IMPLANT
GOWN STRL REUS W/ TWL XL LVL3 (GOWN DISPOSABLE) ×2 IMPLANT
GOWN STRL REUS W/TWL XL LVL3 (GOWN DISPOSABLE) ×4
HANDPIECE INTERPULSE COAX TIP (DISPOSABLE) ×2
HOLDER FOLEY CATH W/STRAP (MISCELLANEOUS) IMPLANT
IMMOBILIZER KNEE 20 (SOFTGOODS) ×2
IMMOBILIZER KNEE 20 THIGH 36 (SOFTGOODS) IMPLANT
KIT TURNOVER KIT A (KITS) IMPLANT
MANIFOLD NEPTUNE II (INSTRUMENTS) ×2 IMPLANT
NS IRRIG 1000ML POUR BTL (IV SOLUTION) ×2 IMPLANT
PACK TOTAL KNEE CUSTOM (KITS) ×2 IMPLANT
PROTECTOR NERVE ULNAR (MISCELLANEOUS) ×2 IMPLANT
SET HNDPC FAN SPRY TIP SCT (DISPOSABLE) ×1 IMPLANT
STAPLER VISISTAT 35W (STAPLE) IMPLANT
STRIP CLOSURE SKIN 1/2X4 (GAUZE/BANDAGES/DRESSINGS) ×4 IMPLANT
SUT MNCRL AB 3-0 PS2 18 (SUTURE) ×2 IMPLANT
SUT VIC AB 0 CT1 36 (SUTURE) ×2 IMPLANT
SUT VIC AB 1 CT1 36 (SUTURE) ×6 IMPLANT
SUT VIC AB 2-0 CT1 27 (SUTURE) ×2
SUT VIC AB 2-0 CT1 TAPERPNT 27 (SUTURE) ×1 IMPLANT
TIBIAL BASE ROT PLAT SZ 5 KNEE (Knees) ×2 IMPLANT
TRAY FOLEY MTR SLVR 16FR STAT (SET/KITS/TRAYS/PACK) ×2 IMPLANT
WATER STERILE IRR 1000ML POUR (IV SOLUTION) ×4 IMPLANT

## 2022-01-09 NOTE — Op Note (Unsigned)
NAME: Scott [name], Scott [name] RECORD NO: [id number] ACCOUNT NO: [id number] DATE OF BIRTH: 1936/03/23 FACILITY: [facility] PHYSICIAN: Doran Heater. Veverly Fells, MD  Operative Report   DATE OF PROCEDURE: 01/09/2022  PREOPERATIVE DIAGNOSIS:  Right knee end-stage arthritis.  POSTOPERATIVE DIAGNOSIS:  Right knee end-stage arthritis.  PROCEDURE PERFORMED:  Right total knee arthroplasty using DePuy Attune prosthesis.  ATTENDING SURGEON:  Doran Heater. Veverly Fells, MD  ASSISTANT:  Charletta Cousin [place], [place], who was scrubbed during the entire procedure and necessary for satisfactory completion of surgery.  ANESTHESIA:  Spinal anesthesia plus adductor canal block was used.  ESTIMATED BLOOD LOSS:  Minimal.  FLUID REPLACEMENT:  1500 mL crystalloid.  INSTRUMENT COUNTS:  Correct.  COMPLICATIONS:  No complications.  ANTIBIOTICS:  Perioperative antibiotics were given.  INDICATIONS:  The patient is an 86 year old male who presents with a history of worsening right knee pain and declining function secondary to end-stage arthritis, bone-on-bone.  The patient's pain and instability was threatening his independence.  The  patient when presented options for treatment, wished to proceed with knee replacement to restore function and to eliminate pain.  Informed consent obtained.  DESCRIPTION OF PROCEDURE:  After an adequate level of anesthesia was achieved, the patient was positioned supine on the operating room table.  Right leg correctly identified and sterilely prepped and draped in the usual manner.  Timeout called, verifying  correct patient, correct site.  We placed a nonsterile tourniquet in the proximal thigh prior to prep and drape of the knee.  Once we had our timeout called and our sterile prep and drape performed, we elevated the leg and exsanguinated with an Esmarch  bandage and inflating the tourniquet to 325 mmHg.  We then placed the knee in flexion and performed a longitudinal midline incision with  a 10 blade scalpel.  Dissection down through subcutaneous tissues using the knife. We used a fresh 10 blade for the  parapatellar arthrotomy and divided lateral patellofemoral ligaments everting the patella.  We encountered eburnated bone and complete cartilage loss in the knee.  We entered the distal femur with a step cut drill and placed our intramedullary guide,  resecting 10 mm off the distal femur set on 5 degrees right as the patient had a flexion contracture.  We then measured our femur and sized it to a size 5 anterior down and performed our anterior, posterior and chamfer cuts with the 4-in-1 block.  We  then removed ACL and PCL meniscal tissue, subluxing the tibia anteriorly.  We then cut the tibia 2 mm off the affected medial side perpendicular to the long axis of the tibia with minimal posterior slope with the oscillating saw.  We used a lamina  spreader and removed excess posterior femoral osteophytes with the osteotome.  We next injected the posterior capsule with combination of Marcaine, Exparel and saline.  We then checked our gaps, which were symmetric at 6 mm, both flexion and extension.   We removed our pins from the tibia and then completed our tibial preparation with the modular drill and keel punch for the 5 tibia. With our trial tibia in place, we did our box cut for the femur and then placed our 5 trial box for the 5 right femur and  then drilled our lug holes, placed a 6 mm trial and then reduced the knee.  We had a nice stable knee.  We felt like we can probably get either an 8 or a 10 in it.  We then resurfaced the patella  going from 25 mm thickness down to 50 mm thickness,  drilling lug holes for the 38 patellar button.  We then ranged the knee with the trial button in place and had excellent patellar tracking with no-touch technique.  We removed all trial components, irrigated the knee well and then dried the bone well and  then we vacuum mixed high viscosity cement and cemented  the components into place in a single step.  Once the cement was hardened, we removed excess cement with quarter-inch curved osteotome, did our final inspection and irrigation of the joint.  We  trialed with a size 8 and then we went to a size 10, so it is a size 5, 10 mm poly we placed on the tibia and reduced the knee.  We were happy with our flexion and extension stability, which was symmetric and our patellar tracking was normal.  We  irrigated thoroughly.  After injecting the anterior capsule with Marcaine, Exparel, saline combination, we closed the parapatellar arthrotomy with #1 Vicryl suture, followed by 2-0 Vicryl for subcutaneous closure and 4-0 Monocryl for skin.  Steri-Strips  applied followed by sterile dressing.  The patient tolerated surgery well.   VAI D: 01/09/2022 10:44:22 am T: 01/09/2022 10:58:00 pm  JOB: [id number]/ [id number]

## 2022-01-09 NOTE — Evaluation (Signed)
Physical Therapy Evaluation Patient Details Name: Scott [name] MRN: [id number] DOB: 1936/02/18 Today's Date: 01/09/2022  History of Present Illness  Pt s/p R TKR and with hx of CAD, MI, bil carpal tunnel, R RCR and L TKR  Clinical Impression  Pt s/p R TKR and presents with decreased R LE strength/ROM and post op pain limiting functional mobility.  Pt should progress to dc home with family assist and reports first OP PT scheduled for Monday 01/12/22.     Recommendations for follow up therapy are one component of a multi-disciplinary discharge planning process, led by the attending physician.  Recommendations may be updated based on patient status, additional functional criteria and insurance authorization.  Follow Up Recommendations Follow physician's recommendations for discharge plan and follow up therapies    Assistance Recommended at Discharge Frequent or constant Supervision/Assistance  Patient can return home with the following  A little help with walking and/or transfers;A little help with bathing/dressing/bathroom;Assist for transportation;Help with stairs or ramp for entrance;Assistance with cooking/housework    Equipment Recommendations None recommended by PT  Recommendations for Other Services       Functional Status Assessment Patient has had a recent decline in their functional status and demonstrates the ability to make significant improvements in function in a reasonable and predictable amount of time.     Precautions / Restrictions Precautions Precautions: Knee;Fall Required Braces or Orthoses: Knee Immobilizer - Right Knee Immobilizer - Right: Discontinue once straight leg raise with < 10 degree lag Restrictions Weight Bearing Restrictions: No Other Position/Activity Restrictions: WBAT      Mobility  Bed Mobility Overal bed mobility: Needs Assistance Bed Mobility: Supine to Sit     Supine to sit: Min assist     General bed mobility comments: cues for sequence  and use of R LE to self assist    Transfers Overall transfer level: Needs assistance Equipment used: Rolling walker (2 wheels) Transfers: Sit to/from Stand Sit to Stand: Min assist           General transfer comment: cues for LE manaegement and use of UEs to self assist    Ambulation/Gait Ambulation/Gait assistance: Min assist, Min guard Gait Distance (Feet): 100 Feet Assistive device: Rolling walker (2 wheels) Gait Pattern/deviations: Step-to pattern, Decreased step length - right, Decreased step length - left, Shuffle, Trunk flexed Gait velocity: decr     General Gait Details: cues for posture, position from RW and sequence  Stairs            Wheelchair Mobility    Modified Rankin (Stroke Patients Only)       Balance Overall balance assessment: Needs assistance Sitting-balance support: No upper extremity supported, Feet supported Sitting balance-Leahy Scale: Good     Standing balance support: No upper extremity supported Standing balance-Leahy Scale: Fair                               Pertinent Vitals/Pain Pain Assessment Pain Assessment: 0-10 Pain Score: 3  Pain Location: R knee Pain Descriptors / Indicators: Aching, Sore Pain Intervention(s): Limited activity within patient's tolerance, Monitored during session, Premedicated before session, Ice applied    Home Living Family/patient expects to be discharged to:: Private residence Living Arrangements: Spouse/significant other Available Help at Discharge: Family;Available 24 hours/day Type of Home: House Home Access: Stairs to enter Entrance Stairs-Rails: Right Entrance Stairs-Number of Steps: 3   Home Layout: One level Home Equipment: Grab bars - tub/shower;Shower seat;BSC/3in1;Rolling  Walker (2 wheels);Cane - single point      Prior Function Prior Level of Function : Independent/Modified Independent                     Hand Dominance   Dominant Hand: Left     Extremity/Trunk Assessment   Upper Extremity Assessment Upper Extremity Assessment: Overall WFL for tasks assessed    Lower Extremity Assessment Lower Extremity Assessment: LLE deficits/detail       Communication   Communication: No difficulties  Cognition Arousal/Alertness: Awake/alert Behavior During Therapy: WFL for tasks assessed/performed Overall Cognitive Status: Within Functional Limits for tasks assessed                                          General Comments      Exercises Total Joint Exercises Ankle Circles/Pumps: AROM, Both, 15 reps, Supine   Assessment/Plan    PT Assessment Patient needs continued PT services  PT Problem List Decreased strength;Decreased range of motion;Decreased activity tolerance;Decreased balance;Decreased cognition;Decreased knowledge of use of DME;Pain       PT Treatment Interventions DME instruction;Gait training;Stair training;Functional mobility training;Therapeutic activities;Therapeutic exercise;Patient/family education    PT Goals (Current goals can be found in the Care Plan section)  Acute Rehab PT Goals Patient Stated Goal: Regain IND PT Goal Formulation: With patient Time For Goal Achievement: 01/16/22 Potential to Achieve Goals: Good    Frequency 7X/week     Co-evaluation               AM-PAC PT "6 Clicks" Mobility  Outcome Measure Help needed turning from your back to your side while in a flat bed without using bedrails?: A Little Help needed moving from lying on your back to sitting on the side of a flat bed without using bedrails?: A Little Help needed moving to and from a bed to a chair (including a wheelchair)?: A Little Help needed standing up from a chair using your arms (e.g., wheelchair or bedside chair)?: A Little Help needed to walk in hospital room?: A Little Help needed climbing 3-5 steps with a railing? : A Lot 6 Click Score: 17    End of Session Equipment Utilized During  Treatment: Gait belt;Right knee immobilizer Activity Tolerance: Patient tolerated treatment well Patient left: in chair;with call bell/phone within reach;with chair alarm set Nurse Communication: Mobility status PT Visit Diagnosis: Difficulty in walking, not elsewhere classified (R26.2)    Time: 8110-3159 PT Time Calculation (min) (ACUTE ONLY): 27 min   Charges:   PT Evaluation $PT Eval Low Complexity: 1 Low PT Treatments $Gait Training: 8-22 mins        [place] Pager [phone] Office [phone]   Kemiya Batdorf 01/09/2022, 6:02 PM

## 2022-01-09 NOTE — Interval H&P Note (Signed)
History and Physical Interval Note:  01/09/2022 8:52 AM  Scott [name]  has presented today for surgery, with the diagnosis of right knee endstage osteoarthritis.  The various methods of treatment have been discussed with the patient and family. After consideration of risks, benefits and other options for treatment, the patient has consented to  Procedure(s): TOTAL KNEE ARTHROPLASTY (Right) as a surgical intervention.  The patient's history has been reviewed, patient examined, no change in status, stable for surgery.  I have reviewed the patient's chart and labs.  Questions were answered to the patient's satisfaction.     Augustin Schooling

## 2022-01-09 NOTE — Brief Op Note (Signed)
01/09/2022  10:35 AM  PATIENT:  Scott [name]  86 y.o. male  PRE-OPERATIVE DIAGNOSIS:  right knee endstage osteoarthritis  POST-OPERATIVE DIAGNOSIS:  right knee endstage osteoarthritis  PROCEDURE:  Procedure(s): TOTAL KNEE ARTHROPLASTY (Right) DePuy Attune  SURGEON:  Surgeon(s) and Role:    Netta Cedars, MD - Primary  PHYSICIAN ASSISTANT:   ASSISTANTS: [place]ura Bruns, PA-C   ANESTHESIA:   regional and spinal  EBL:  minimal   BLOOD ADMINISTERED:none  DRAINS: none   LOCAL MEDICATIONS USED:  MARCAINE     SPECIMEN:  No Specimen  DISPOSITION OF SPECIMEN:  N/A  COUNTS:  YES  TOURNIQUET:  * Missing tourniquet times found for documented tourniquets in log: [id number] *  DICTATION: .Other Dictation: Dictation Number [id number]  PLAN OF CARE: Admit to inpatient   PATIENT DISPOSITION:  PACU - hemodynamically stable.   Delay start of Pharmacological VTE agent (>24hrs) due to surgical blood loss or risk of bleeding: no

## 2022-01-09 NOTE — Anesthesia Preprocedure Evaluation (Addendum)
Anesthesia Evaluation  Patient identified by MRN, date of birth, ID band Patient awake    Reviewed: Allergy & Precautions, NPO status , Patient's Chart, lab work & pertinent test results  Airway Mallampati: II  TM Distance: >3 FB Neck ROM: Full    Dental  (+) Dental Advisory Given   Pulmonary sleep apnea ,    breath sounds clear to auscultation       Cardiovascular hypertension, Pt. on medications + CAD, + Past MI and + Cardiac Stents   Rhythm:Regular Rate:Normal     Neuro/Psych negative neurological ROS     GI/Hepatic Neg liver ROS, GERD  ,  Endo/Other  negative endocrine ROS  Renal/GU negative Renal ROS     Musculoskeletal  (+) Arthritis ,   Abdominal   Peds  Hematology negative hematology ROS (+)   Anesthesia Other Findings   Reproductive/Obstetrics                            Anesthesia Physical Anesthesia Plan  ASA: 3  Anesthesia Plan: Spinal   Post-op Pain Management: Regional block* and Tylenol PO (pre-op)*   Induction:   PONV Risk Score and Plan: 1 and Propofol infusion and Ondansetron  Airway Management Planned: Natural Airway and Simple Face Mask  Additional Equipment:   Intra-op Plan:   Post-operative Plan:   Informed Consent: I have reviewed the patients History and Physical, chart, labs and discussed the procedure including the risks, benefits and alternatives for the proposed anesthesia with the patient or authorized representative who has indicated his/her understanding and acceptance.       Plan Discussed with: CRNA  Anesthesia Plan Comments:        Anesthesia Quick Evaluation

## 2022-01-09 NOTE — Anesthesia Postprocedure Evaluation (Signed)
Anesthesia Post Note  Patient: Scott [name]  Procedure(s) Performed: TOTAL KNEE ARTHROPLASTY (Right: Knee)     Patient location during evaluation: PACU Anesthesia Type: Spinal and MAC Level of consciousness: awake and alert Pain management: pain level controlled Vital Signs Assessment: post-procedure vital signs reviewed and stable Respiratory status: spontaneous breathing and respiratory function stable Cardiovascular status: blood pressure returned to baseline and stable Postop Assessment: spinal receding Anesthetic complications: no   No notable events documented.  Last Vitals:  Vitals:   01/09/22 1130 01/09/22 1250  BP: 138/81 131/70  Pulse: 60   Resp: 11 18  Temp:  36.4 C  SpO2: 93% 95%    Last Pain:  Vitals:   01/09/22 1250  TempSrc: Oral  PainSc: 1                  Tiajuana Amass

## 2022-01-09 NOTE — Transfer of Care (Signed)
Immediate Anesthesia Transfer of Care Note  Patient: Scott [name]  Procedure(s) Performed: TOTAL KNEE ARTHROPLASTY (Right: Knee)  Patient Location: PACU  Anesthesia Type:Spinal  Level of Consciousness: drowsy and patient cooperative  Airway & Oxygen Therapy: Patient connected to face mask oxygen  Post-op Assessment: Report given to RN and Post -op Vital signs reviewed and stable  Post vital signs: Reviewed and stable  Last Vitals:  Vitals Value Taken Time  BP 123/70 01/09/22 1056  Temp    Pulse 60 01/09/22 1058  Resp 13 01/09/22 1058  SpO2 97 % 01/09/22 1058  Vitals shown include unvalidated device data.  Last Pain:  Vitals:   01/09/22 0801  TempSrc: Oral  PainSc: 0-No pain      Patients Stated Pain Goal: 3 (32/44/01 0272)  Complications: No notable events documented.

## 2022-01-09 NOTE — Anesthesia Procedure Notes (Signed)
Spinal  Patient location during procedure: OR Start time: 01/09/2022 9:02 AM End time: 01/09/2022 9:07 AM Reason for block: surgical anesthesia Staffing Performed: anesthesiologist  Anesthesiologist: Suzette Battiest, MD Preanesthetic Checklist Completed: patient identified, IV checked, site marked, risks and benefits discussed, surgical consent, monitors and equipment checked, pre-op evaluation and timeout performed Spinal Block Patient position: sitting Prep: DuraPrep Patient monitoring: heart rate, cardiac monitor, continuous pulse ox and blood pressure Approach: midline Location: L3-4 Injection technique: single-shot Needle Needle type: Pencan  Needle gauge: 24 G Needle length: 9 cm Assessment Sensory level: T4 Events: CSF return

## 2022-01-09 NOTE — Discharge Instructions (Signed)
Ice to the knee constantly.  Keep the incision covered and clean and dry for one week, then ok to get it wet in the shower.  Do exercise as instructed every hour, please to prevent stiffness.    DO NOT prop anything under the knee, it will make your knee stiff.  Prop under the ankle to encourage your knee to go straight.   Use the walker while you are up and around for balance.  Wear your support stockings 24/7 to prevent blood clots and take baby aspirin twice daily for 30 days also to prevent blood clots  Follow up with Dr Desiderio Dolata in two weeks in the office, call [phone] for appt   INSTRUCTIONS AFTER JOINT REPLACEMENT   Remove items at home which could result in a fall. This includes throw rugs or furniture in walking pathways ICE to the affected joint every three hours while awake for 30 minutes at a time, for at least the first 3-5 days, and then as needed for pain and swelling.  Continue to use ice for pain and swelling. You may notice swelling that will progress down to the foot and ankle.  This is normal after surgery.  Elevate your leg when you are not up walking on it.   Continue to use the breathing machine you got in the hospital (incentive spirometer) which will help keep your temperature down.  It is common for your temperature to cycle up and down following surgery, especially at night when you are not up moving around and exerting yourself.  The breathing machine keeps your lungs expanded and your temperature down.   DIET:  As you were doing prior to hospitalization, we recommend a well-balanced diet.  DRESSING / WOUND CARE / SHOWERING  You may change your dressing 3-5 days after surgery.  Then change the dressing every day with sterile gauze.  Please use good hand washing techniques before changing the dressing.  Do not use any lotions or creams on the incision until instructed by your surgeon.  ACTIVITY  Increase activity slowly as tolerated, but follow the weight  bearing instructions below.   No driving for 6 weeks or until further direction given by your physician.  You cannot drive while taking narcotics.  No lifting or carrying greater than 10 lbs. until further directed by your surgeon. Avoid periods of inactivity such as sitting longer than an hour when not asleep. This helps prevent blood clots.  You may return to work once you are authorized by your doctor.     WEIGHT BEARING   Weight bearing as tolerated with assist device (walker, cane, etc) as directed, use it as long as suggested by your surgeon or therapist, typically at least 4-6 weeks.   EXERCISES  Results after joint replacement surgery are often greatly improved when you follow the exercise, range of motion and muscle strengthening exercises prescribed by your doctor. Safety measures are also important to protect the joint from further injury. Any time any of these exercises cause you to have increased pain or swelling, decrease what you are doing until you are comfortable again and then slowly increase them. If you have problems or questions, call your caregiver or physical therapist for advice.   Rehabilitation is important following a joint replacement. After just a few days of immobilization, the muscles of the leg can become weakened and shrink (atrophy).  These exercises are designed to build up the tone and strength of the thigh and leg muscles and to   improve motion. Often times heat used for twenty to thirty minutes before working out will loosen up your tissues and help with improving the range of motion but do not use heat for the first two weeks following surgery (sometimes heat can increase post-operative swelling).   These exercises can be done on a training (exercise) mat, on the floor, on a table or on a bed. Use whatever works the best and is most comfortable for you.    Use music or television while you are exercising so that the exercises are a pleasant break in your day.  This will make your life better with the exercises acting as a break in your routine that you can look forward to.   Perform all exercises about fifteen times, three times per day or as directed.  You should exercise both the operative leg and the other leg as well.  Exercises include:   Quad Sets - Tighten up the muscle on the front of the thigh (Quad) and hold for 5-10 seconds.   Straight Leg Raises - With your knee straight (if you were given a brace, keep it on), lift the leg to 60 degrees, hold for 3 seconds, and slowly lower the leg.  Perform this exercise against resistance later as your leg gets stronger.  Leg Slides: Lying on your back, slowly slide your foot toward your buttocks, bending your knee up off the floor (only go as far as is comfortable). Then slowly slide your foot back down until your leg is flat on the floor again.  Angel Wings: Lying on your back spread your legs to the side as far apart as you can without causing discomfort.  Hamstring Strength:  Lying on your back, push your heel against the floor with your leg straight by tightening up the muscles of your buttocks.  Repeat, but this time bend your knee to a comfortable angle, and push your heel against the floor.  You may put a pillow under the heel to make it more comfortable if necessary.   A rehabilitation program following joint replacement surgery can speed recovery and prevent re-injury in the future due to weakened muscles. Contact your doctor or a physical therapist for more information on knee rehabilitation.    CONSTIPATION  Constipation is defined medically as fewer than three stools per week and severe constipation as less than one stool per week.  Even if you have a regular bowel pattern at home, your normal regimen is likely to be disrupted due to multiple reasons following surgery.  Combination of anesthesia, postoperative narcotics, change in appetite and fluid intake all can affect your bowels.   YOU MUST  use at least one of the following options; they are listed in order of increasing strength to get the job done.  They are all available over the counter, and you may need to use some, POSSIBLY even all of these options:    Drink plenty of fluids (prune juice may be helpful) and high fiber foods Colace 100 mg by mouth twice a day  Senokot for constipation as directed and as needed Dulcolax (bisacodyl), take with full glass of water  Miralax (polyethylene glycol) once or twice a day as needed.  If you have tried all these things and are unable to have a bowel movement in the first 3-4 days after surgery call either your surgeon or your primary doctor.    If you experience loose stools or diarrhea, hold the medications until you stool forms back   up.  If your symptoms do not get better within 1 week or if they get worse, check with your doctor.  If you experience "the worst abdominal pain ever" or develop nausea or vomiting, please contact the office immediately for further recommendations for treatment.   ITCHING:  If you experience itching with your medications, try taking only a single pain pill, or even half a pain pill at a time.  You can also use Benadryl over the counter for itching or also to help with sleep.   TED HOSE STOCKINGS:  Use stockings on both legs until for at least 2 weeks or as directed by physician office. They may be removed at night for sleeping.  MEDICATIONS:  See your medication summary on the "After Visit Summary" that nursing will review with you.  You may have some home medications which will be placed on hold until you complete the course of blood thinner medication.  It is important for you to complete the blood thinner medication as prescribed.  PRECAUTIONS:  If you experience chest pain or shortness of breath - call 911 immediately for transfer to the hospital emergency department.   If you develop a fever greater that 101 F, purulent drainage from wound, increased  redness or drainage from wound, foul odor from the wound/dressing, or calf pain - CONTACT YOUR SURGEON.                                                   FOLLOW-UP APPOINTMENTS:  If you do not already have a post-op appointment, please call the office for an appointment to be seen by your surgeon.  Guidelines for how soon to be seen are listed in your "After Visit Summary", but are typically between 1-4 weeks after surgery.  OTHER INSTRUCTIONS:   Knee Replacement:  Do not place pillow under knee, focus on keeping the knee straight while resting. CPM instructions: 0-90 degrees, 2 hours in the morning, 2 hours in the afternoon, and 2 hours in the evening. Place foam block, curve side up under heel at all times except when in CPM or when walking.  DO NOT modify, tear, cut, or change the foam block in any way.  POST-OPERATIVE OPIOID TAPER INSTRUCTIONS: It is important to wean off of your opioid medication as soon as possible. If you do not need pain medication after your surgery it is ok to stop day one. Opioids include: Codeine, Hydrocodone(Norco, Vicodin), Oxycodone(Percocet, oxycontin) and hydromorphone amongst others.  Long term and even short term use of opiods can cause: Increased pain response Dependence Constipation Depression Respiratory depression And more.  Withdrawal symptoms can include Flu like symptoms Nausea, vomiting And more Techniques to manage these symptoms Hydrate well Eat regular healthy meals Stay active Use relaxation techniques(deep breathing, meditating, yoga) Do Not substitute Alcohol to help with tapering If you have been on opioids for less than two weeks and do not have pain than it is ok to stop all together.  Plan to wean off of opioids This plan should start within one week post op of your joint replacement. Maintain the same interval or time between taking each dose and first decrease the dose.  Cut the total daily intake of opioids by one tablet each  day Next start to increase the time between doses. The last dose that should be eliminated is   the evening dose.   MAKE SURE YOU:  Understand these instructions.  Get help right away if you are not doing well or get worse.    Thank you for letting us be a part of your medical care team.  It is a privilege we respect greatly.  We hope these instructions will help you stay on track for a fast and full recovery!      

## 2022-01-09 NOTE — Progress Notes (Signed)
Pt set up on cpap for the night. ?

## 2022-01-09 NOTE — Progress Notes (Signed)
Assisted Dr. Rob Fitzgerald with right, adductor canal, ultrasound guided block. Side rails up, monitors on throughout procedure. See vital signs in flow sheet. Tolerated Procedure well. 

## 2022-01-09 NOTE — Anesthesia Procedure Notes (Signed)
Anesthesia Regional Block: Adductor canal block   Pre-Anesthetic Checklist: , timeout performed,  Correct Patient, Correct Site, Correct Laterality,  Correct Procedure, Correct Position, site marked,  Risks and benefits discussed,  Surgical consent,  Pre-op evaluation,  At surgeon's request and post-op pain management  Laterality: Right  Prep: chloraprep       Needles:  Injection technique: Single-shot  Needle Type: Echogenic Needle     Needle Length: 9cm  Needle Gauge: 21     Additional Needles:   Procedures:,,,, ultrasound used (permanent image in chart),,    Narrative:  Start time: 01/09/2022 8:00 AM End time: 01/09/2022 8:04 AM Injection made incrementally with aspirations every 5 mL.  Performed by: Personally  Anesthesiologist: Suzette Battiest, MD

## 2022-01-09 NOTE — Op Note (Unsigned)
NAME: Scott [name], Scott [name] RECORD NO: [id number] ACCOUNT NO: [id number] DATE OF BIRTH: 1936/01/06 FACILITY: [facility] LOCATION: [id number] PHYSICIAN: Doran Heater. Veverly Fells, MD  Operative Report   DATE OF PROCEDURE: 01/09/2022  PREOPERATIVE DIAGNOSIS:  Right knee end-stage arthritis.  POSTOPERATIVE DIAGNOSIS:  Right knee end-stage arthritis.  PROCEDURE PERFORMED:  Right total knee arthroplasty using DePuy Attune prosthesis.  ATTENDING SURGEON:  Doran Heater. Veverly Fells, MD  ASSISTANT:  Charletta Cousin [place], [place], who was scrubbed during the entire procedure and necessary for satisfactory completion of surgery.  ANESTHESIA:  Spinal anesthesia was used plus adductor canal block.  ESTIMATED BLOOD LOSS:  Minimal.  FLUID REPLACEMENT:  1200 mL crystalloid.  INSTRUMENT COUNTS:  Correct.  COMPLICATIONS:  No complications.  ANTIBIOTICS:  Perioperative antibiotics were given.  INDICATIONS:  The patient is an 86 year old male who presents with a history of worsening right knee pain secondary to end-stage arthritis.  The patient has instability and pain threatening his independence and mobility.  After discussing options with  the patient, the patient elected to proceed with right total knee arthroplasty to eliminate pain and to restore function.  Informed consent obtained.  DESCRIPTION OF PROCEDURE:  After an adequate level of spinal anesthesia was achieved and the adductor canal block was placed,  the patient was placed supine on the operating table.  The right leg correctly identified.  A nonsterile tourniquet placed on  proximal thigh.  Right leg sterilely prepped and draped in the usual manner.  Timeout called, verifying correct patient, correct site.  We placed the knee in flexion and performed a longitudinal midline incision with a 10 blade scalpel.  Dissection down  through subcutaneous tissues.  We used a fresh 10 blade for the medial parapatellar arthrotomy, everted the patella, exposing the distal femur,  which was devoid of cartilage, with eburnated bone.  We entered the distal femur with a step cut drill.  We  then resected 10 mm off the distal femur set on 5 degrees of valgus as the patient had flexion contracture.  Once we  Dictation Ends Here   SHW D: 01/09/2022 10:39:20 am T: 01/09/2022 11:01:00 pm  JOB: [id number]/ [id number]

## 2022-01-10 LAB — BASIC METABOLIC PANEL
Anion gap: 7 (ref 5–15)
BUN: 30 mg/dL — ABNORMAL HIGH (ref 8–23)
CO2: 25 mmol/L (ref 22–32)
Calcium: 8.7 mg/dL — ABNORMAL LOW (ref 8.9–10.3)
Chloride: 105 mmol/L (ref 98–111)
Creatinine, Ser: 1.2 mg/dL (ref 0.61–1.24)
GFR, Estimated: 60 mL/min — ABNORMAL LOW (ref >60)
Glucose, Bld: 155 mg/dL — ABNORMAL HIGH (ref 70–99)
Potassium: 4.1 mmol/L (ref 3.5–5.1)
Sodium: 137 mmol/L (ref 135–145)

## 2022-01-10 LAB — CBC
HCT: 36.5 % — ABNORMAL LOW (ref 39.0–52.0)
Hemoglobin: 11.9 g/dL — ABNORMAL LOW (ref 13.0–17.0)
MCH: 31.6 pg (ref 26.0–34.0)
MCHC: 32.6 g/dL (ref 30.0–36.0)
MCV: 97.1 fL (ref 80.0–100.0)
Platelets: 159 10*3/uL (ref 150–400)
RBC: 3.76 MIL/uL — ABNORMAL LOW (ref 4.22–5.81)
RDW: 12.5 % (ref 11.5–15.5)
WBC: 16.3 10*3/uL — ABNORMAL HIGH (ref 4.0–10.5)
nRBC: 0 % (ref 0.0–0.2)

## 2022-01-10 NOTE — Progress Notes (Signed)
Orthopedic Tech Progress Note Patient Details:  Scott [name] Feb 06, 1936 [id number]  CPM Right Knee CPM Right Knee: On Right Knee Flexion (Degrees): 60 Right Knee Extension (Degrees): 0  Post Interventions Patient Tolerated: Well Instructions Provided: Care of device, Adjustment of device Ortho Devices Type of Ortho Device: CPM padding Ortho Device/Splint Location: RLE Ortho Device/Splint Interventions: Ordered, Application, Adjustment   Post Interventions Patient Tolerated: Well Instructions Provided: Care of device, Adjustment of device  [name] 01/10/2022, 11:10 AM

## 2022-01-10 NOTE — Progress Notes (Signed)
Physical Therapy Treatment Patient Details Name: Scott [name] MRN: [id number] DOB: [birth year] Today's Date: 01/10/2022   History of Present Illness Pt s/p R TKR and with hx of CAD, MI, bil carpal tunnel, R RCR and L TKR    PT Comments    Pt continues motivated and progressing well with mobility.  Pt hopeful for dc home tomorrow.  Recommendations for follow up therapy are one component of a multi-disciplinary discharge planning process, led by the attending physician.  Recommendations may be updated based on patient status, additional functional criteria and insurance authorization.  Follow Up Recommendations  Follow physician's recommendations for discharge plan and follow up therapies     Assistance Recommended at Discharge Frequent or constant Supervision/Assistance  Patient can return home with the following A little help with walking and/or transfers;A little help with bathing/dressing/bathroom;Assist for transportation;Help with stairs or ramp for entrance;Assistance with cooking/housework   Equipment Recommendations  None recommended by PT    Recommendations for Other Services       Precautions / Restrictions Precautions Precautions: Knee;Fall Required Braces or Orthoses: Knee Immobilizer - Right Knee Immobilizer - Right: Discontinue once straight leg raise with < 10 degree lag (Pt performed IND SLR this am) Restrictions Weight Bearing Restrictions: No RLE Weight Bearing: Weight bearing as tolerated Other Position/Activity Restrictions: WBAT     Mobility  Bed Mobility Overal bed mobility: Needs Assistance Bed Mobility: Supine to Sit     Supine to sit: HOB elevated, Min guard     General bed mobility comments: Increased time    Transfers Overall transfer level: Needs assistance Equipment used: Rolling walker (2 wheels) Transfers: Sit to/from Stand Sit to Stand: Min guard           General transfer comment: cues for LE manaegement and use of UEs to self  assist    Ambulation/Gait Ambulation/Gait assistance: Min guard Gait Distance (Feet): 170 Feet Assistive device: Rolling walker (2 wheels) Gait Pattern/deviations: Step-to pattern, Decreased step length - right, Decreased step length - left, Shuffle, Trunk flexed Gait velocity: decr     General Gait Details: cues for posture, position from RW and sequence   Stairs             Wheelchair Mobility    Modified Rankin (Stroke Patients Only)       Balance Overall balance assessment: Needs assistance Sitting-balance support: No upper extremity supported, Feet supported Sitting balance-Leahy Scale: Good     Standing balance support: No upper extremity supported Standing balance-Leahy Scale: Fair                              Cognition Arousal/Alertness: Awake/alert Behavior During Therapy: WFL for tasks assessed/performed Overall Cognitive Status: Within Functional Limits for tasks assessed                                          Exercises Total Joint Exercises Ankle Circles/Pumps: AROM, Both, 15 reps, Supine Quad Sets: AROM, Both, 10 reps, Supine Heel Slides: AAROM, Right, 15 reps, Supine Straight Leg Raises: AAROM, AROM, Right, 15 reps, Supine Goniometric ROM: -5 - 100 AAROM R knee    General Comments        Pertinent Vitals/Pain Pain Assessment Pain Assessment: 0-10 Pain Score: 4  Pain Location: R knee Pain Descriptors / Indicators: Aching, Sore Pain Intervention(s): Limited activity  within patient's tolerance, Monitored during session, Ice applied, Patient requesting pain meds-RN notified    Home Living                          Prior Function            PT Goals (current goals can now be found in the care plan section) Acute Rehab PT Goals Patient Stated Goal: Regain IND PT Goal Formulation: With patient Time For Goal Achievement: 01/16/22 Potential to Achieve Goals: Good Progress towards PT goals:  Progressing toward goals    Frequency    7X/week      PT Plan Current plan remains appropriate    Co-evaluation              AM-PAC PT "6 Clicks" Mobility   Outcome Measure  Help needed turning from your back to your side while in a flat bed without using bedrails?: A Little Help needed moving from lying on your back to sitting on the side of a flat bed without using bedrails?: A Little Help needed moving to and from a bed to a chair (including a wheelchair)?: A Little Help needed standing up from a chair using your arms (e.g., wheelchair or bedside chair)?: A Little Help needed to walk in hospital room?: A Little Help needed climbing 3-5 steps with a railing? : A Lot 6 Click Score: 17    End of Session Equipment Utilized During Treatment: Gait belt Activity Tolerance: Patient tolerated treatment well Patient left: in bed;with call bell/phone within reach;with bed alarm set Nurse Communication: Mobility status PT Visit Diagnosis: Difficulty in walking, not elsewhere classified (R26.2)     Time: 9458-5929 PT Time Calculation (min) (ACUTE ONLY): 33 min  Charges:  $Gait Training: 8-22 mins $Therapeutic Exercise: 8-22 mins                     [place] Pager [phone] Office [phone]    Cari Burgo 01/10/2022, 3:26 PM

## 2022-01-10 NOTE — Progress Notes (Signed)
Orthopedics Progress Note  Subjective: Pain controlled and mobilizing well  Objective:  Vitals:   01/09/22 2132 01/10/22 0522  BP: 133/75 125/62  Pulse: 66 68  Resp: 18 19  Temp: 97.8 F (36.6 C) 99 F (37.2 C)  SpO2: 94% 94%    General: Awake and alert  Musculoskeletal: Right knee dressing changed. Wound healing well, no signs for infection Neg Homans Neurovascularly intact  Lab Results  Component Value Date   WBC 16.3 (H) 01/10/2022   HGB 11.9 (L) 01/10/2022   HCT 36.5 (L) 01/10/2022   MCV 97.1 01/10/2022   PLT 159 01/10/2022       Component Value Date/Time   NA 137 01/10/2022 0348   NA 143 08/12/2012 1324   K 4.1 01/10/2022 0348   K 4.1 08/12/2012 1324   CL 105 01/10/2022 0348   CL 110 (H) 08/12/2012 1324   CO2 25 01/10/2022 0348   CO2 24 08/12/2012 1324   GLUCOSE 155 (H) 01/10/2022 0348   GLUCOSE 123 (H) 08/12/2012 1324   BUN 30 (H) 01/10/2022 0348   BUN 27.0 (H) 08/12/2012 1324   CREATININE 1.20 01/10/2022 0348   CREATININE 1.2 08/12/2012 1324   CALCIUM 8.7 (L) 01/10/2022 0348   CALCIUM 9.3 08/12/2012 1324   GFRNONAA 60 (L) 01/10/2022 0348   GFRAA 58 (L) 07/13/2016 0313    Lab Results  Component Value Date   INR 1.36 07/13/2016   INR 1.46 07/12/2016   INR 1.15 07/11/2016    Assessment/Plan: POD #1 s/p Procedure(s): TOTAL KNEE ARTHROPLASTY PT,OT, pulm toilet, DVT prophylaxis Home tomorrow Follow up in two weeks  Doran Heater. Veverly Fells, MD 01/10/2022 8:37 AM

## 2022-01-10 NOTE — Progress Notes (Signed)
Physical Therapy Treatment Patient Details Name: Scott [name] MRN: [id number] DOB: September 02, 1935 Today's Date: 01/10/2022   History of Present Illness Pt s/p R TKR and with hx of CAD, MI, bil carpal tunnel, R RCR and L TKR    PT Comments    Pt continues very motivated and progressing well with mobility but limited this pm by fatigue.  Pt should progress to dc home tomorrow.   Recommendations for follow up therapy are one component of a multi-disciplinary discharge planning process, led by the attending physician.  Recommendations may be updated based on patient status, additional functional criteria and insurance authorization.  Follow Up Recommendations  Follow physician's recommendations for discharge plan and follow up therapies     Assistance Recommended at Discharge Frequent or constant Supervision/Assistance  Patient can return home with the following A little help with walking and/or transfers;A little help with bathing/dressing/bathroom;Assist for transportation;Help with stairs or ramp for entrance;Assistance with cooking/housework   Equipment Recommendations  None recommended by PT    Recommendations for Other Services       Precautions / Restrictions Precautions Precautions: Knee;Fall Required Braces or Orthoses: Knee Immobilizer - Right Knee Immobilizer - Right: Discontinue once straight leg raise with < 10 degree lag (Pt performed IND SLR this am) Restrictions Weight Bearing Restrictions: No RLE Weight Bearing: Weight bearing as tolerated Other Position/Activity Restrictions: WBAT     Mobility  Bed Mobility Overal bed mobility: Needs Assistance Bed Mobility: Supine to Sit     Supine to sit: HOB elevated, Supervision     General bed mobility comments: Increased time    Transfers Overall transfer level: Needs assistance Equipment used: Rolling walker (2 wheels) Transfers: Sit to/from Stand Sit to Stand: Min guard, Supervision           General transfer  comment: cues for LE manaegement and use of UEs to self assist    Ambulation/Gait Ambulation/Gait assistance: Min guard, Supervision Gait Distance (Feet): 180 Feet Assistive device: Rolling walker (2 wheels) Gait Pattern/deviations: Step-to pattern, Decreased step length - right, Decreased step length - left, Shuffle, Trunk flexed Gait velocity: decr     General Gait Details: cues for posture, position from RW and sequence   Stairs             Wheelchair Mobility    Modified Rankin (Stroke Patients Only)       Balance Overall balance assessment: Needs assistance Sitting-balance support: No upper extremity supported, Feet supported Sitting balance-Leahy Scale: Good     Standing balance support: No upper extremity supported Standing balance-Leahy Scale: Fair                              Cognition Arousal/Alertness: Awake/alert Behavior During Therapy: WFL for tasks assessed/performed Overall Cognitive Status: Within Functional Limits for tasks assessed                                          Exercises Total Joint Exercises Ankle Circles/Pumps: AROM, Both, 15 reps, Supine Quad Sets: AROM, Both, 10 reps, Supine Heel Slides: AAROM, Right, 15 reps, Supine Straight Leg Raises: AAROM, AROM, Right, 15 reps, Supine Goniometric ROM: -5 - 100 AAROM R knee    General Comments        Pertinent Vitals/Pain Pain Assessment Pain Assessment: 0-10 Pain Score: 4  Pain Location: R knee  Pain Descriptors / Indicators: Aching, Sore Pain Intervention(s): Limited activity within patient's tolerance, Monitored during session, Premedicated before session, Ice applied    Home Living                          Prior Function            PT Goals (current goals can now be found in the care plan section) Acute Rehab PT Goals Patient Stated Goal: Regain IND PT Goal Formulation: With patient Time For Goal Achievement: 01/16/22 Potential  to Achieve Goals: Good Progress towards PT goals: Progressing toward goals    Frequency    7X/week      PT Plan Current plan remains appropriate    Co-evaluation              AM-PAC PT "6 Clicks" Mobility   Outcome Measure  Help needed turning from your back to your side while in a flat bed without using bedrails?: A Little Help needed moving from lying on your back to sitting on the side of a flat bed without using bedrails?: A Little Help needed moving to and from a bed to a chair (including a wheelchair)?: A Little Help needed standing up from a chair using your arms (e.g., wheelchair or bedside chair)?: A Little Help needed to walk in hospital room?: A Little Help needed climbing 3-5 steps with a railing? : A Lot 6 Click Score: 14    End of Session Equipment Utilized During Treatment: Gait belt Activity Tolerance: Patient tolerated treatment well;Patient limited by fatigue Patient left: in chair;with call bell/phone within reach;with family/visitor present Nurse Communication: Mobility status PT Visit Diagnosis: Difficulty in walking, not elsewhere classified (R26.2)     Time: 6073-7106 PT Time Calculation (min) (ACUTE ONLY): 18 min  Charges:  $Gait Training: 8-22 mins $Therapeutic Exercise: 8-22 mins                     Debe Coder PT Acute Rehabilitation Services Pager [phone] Office [phone]    Scott [name] 01/10/2022, 3:31 PM

## 2022-01-11 LAB — CBC
HCT: 37.7 % — ABNORMAL LOW (ref 39.0–52.0)
Hemoglobin: 12.3 g/dL — ABNORMAL LOW (ref 13.0–17.0)
MCH: 32 pg (ref 26.0–34.0)
MCHC: 32.6 g/dL (ref 30.0–36.0)
MCV: 98.2 fL (ref 80.0–100.0)
Platelets: 160 10*3/uL (ref 150–400)
RBC: 3.84 MIL/uL — ABNORMAL LOW (ref 4.22–5.81)
RDW: 12.9 % (ref 11.5–15.5)
WBC: 13.9 10*3/uL — ABNORMAL HIGH (ref 4.0–10.5)
nRBC: 0 % (ref 0.0–0.2)

## 2022-01-11 MED ORDER — DOCUSATE SODIUM 100 MG PO CAPS: 100.0000 mg | ORAL_CAPSULE | Freq: Two times a day (BID) | ORAL | 0 refills | Status: DC

## 2022-01-11 MED ORDER — POLYETHYLENE GLYCOL 3350 17 G PO PACK: 17.0000 g | PACK | Freq: Every day | ORAL | 0 refills | Status: DC | PRN

## 2022-01-11 NOTE — Progress Notes (Signed)
Patient ID: Scott [name], male   DOB: May 09, 1936, 86 y.o.   MRN: [id number] Subjective: 2 Days Post-Op Procedure(s) (LRB): TOTAL KNEE ARTHROPLASTY (Right)    Patient reports pain as mild to moderate. Ready to go home No events  Objective:   VITALS:   Vitals:   01/10/22 2141 01/11/22 0542  BP: (!) 151/71 (!) 152/73  Pulse: 67 80  Resp: 17 18  Temp: 98.4 F (36.9 C) 98.2 F (36.8 C)  SpO2: 92% 96%    Neurovascular intact Incision: dressing C/D/I - ACE wrap removed, Aquacell dry  LABS Recent Labs    01/10/22 0348 01/11/22 0332  HGB 11.9* 12.3*  HCT 36.5* 37.7*  WBC 16.3* 13.9*  PLT 159 160    Recent Labs    01/10/22 0348  NA 137  K 4.1  BUN 30*  CREATININE 1.20  GLUCOSE 155*    No results for input(s): LABPT, INR in the last 72 hours.   Assessment/Plan: 2 Days Post-Op Procedure(s) (LRB): TOTAL KNEE ARTHROPLASTY (Right)   Up with therapy Home today after therapy RTC in 2 weeks - [place]

## 2022-01-11 NOTE — TOC Transition Note (Signed)
Transition of Care [facility]) - CM/SW Discharge Note   Patient Details  Name: Scott [name] MRN: [id number] Date of Birth: 17-May-1936  Transition of Care [facility]) CM/SW Contact:  Ross Ludwig, LCSW Phone Number: 01/11/2022, 10:27 AM   Clinical Narrative:     Patient has outpatient PT arranged for tomorrow appointment.  PT is not recommending any equipment, plan is for patient to return back home with his wife.  TOC signing off, please reconsult if other TOC needs arise.  Final next level of care: Home/Self Care Barriers to Discharge: Barriers Resolved   Patient Goals and CMS Choice Patient states their goals for this hospitalization and ongoing recovery are:: To return back home. CMS Medicare.gov Compare Post Acute Care list provided to:: Patient Choice offered to / list presented to : Patient  Discharge Placement                       Discharge Plan and Services                                     Social Determinants of Health (SDOH) Interventions     Readmission Risk Interventions     View : No data to display.

## 2022-01-11 NOTE — Progress Notes (Signed)
Physical Therapy Treatment Patient Details Name: Scott [name] MRN: [id number] DOB: May 23, 1936 Today's Date: 01/11/2022   History of Present Illness Pt s/p R TKR and with hx of CAD, MI, bil carpal tunnel, R RCR and L TKR    PT Comments    Pt continues very cooperative but with marked deterioration in performance this am 2* increased pain (despite premed), increased instability on feet, decreased endurance with noted increased wheezing and WOB.  Pt performed therex program and up to ambulate limited distance in hall but unable to attempt stairs.  RN alerted to pt condition.  Pt currently scheduled for OP PT tomorrow but would be a good candidate for follow up HHPT initially based on current abilities - pt agreeable to same if physician will approve.  Recommendations for follow up therapy are one component of a multi-disciplinary discharge planning process, led by the attending physician.  Recommendations may be updated based on patient status, additional functional criteria and insurance authorization.  Follow Up Recommendations  Follow physician's recommendations for discharge plan and follow up therapies     Assistance Recommended at Discharge Frequent or constant Supervision/Assistance  Patient can return home with the following A little help with walking and/or transfers;A little help with bathing/dressing/bathroom;Assist for transportation;Help with stairs or ramp for entrance;Assistance with cooking/housework   Equipment Recommendations  None recommended by PT    Recommendations for Other Services       Precautions / Restrictions Precautions Precautions: Knee;Fall Required Braces or Orthoses: Knee Immobilizer - Right Knee Immobilizer - Right: Discontinue once straight leg raise with < 10 degree lag Restrictions Weight Bearing Restrictions: No RLE Weight Bearing: Weight bearing as tolerated Other Position/Activity Restrictions: WBAT     Mobility  Bed Mobility Overal bed  mobility: Needs Assistance Bed Mobility: Supine to Sit     Supine to sit: HOB elevated, Min guard     General bed mobility comments: Increased time    Transfers Overall transfer level: Needs assistance Equipment used: Rolling walker (2 wheels) Transfers: Sit to/from Stand Sit to Stand: Min guard           General transfer comment: Steady assist with cues for LE management and use of UEs to self assist    Ambulation/Gait Ambulation/Gait assistance: Min assist, Min guard Gait Distance (Feet): 47 Feet Assistive device: Rolling walker (2 wheels) Gait Pattern/deviations: Step-to pattern, Decreased step length - right, Decreased step length - left, Shuffle, Trunk flexed Gait velocity: decr     General Gait Details: cues for posture, position from RW and sequence.  Distance ltd by pain   Stairs             Wheelchair Mobility    Modified Rankin (Stroke Patients Only)       Balance Overall balance assessment: Needs assistance Sitting-balance support: No upper extremity supported, Feet supported Sitting balance-Leahy Scale: Good     Standing balance support: Single extremity supported Standing balance-Leahy Scale: Poor                              Cognition Arousal/Alertness: Awake/alert Behavior During Therapy: WFL for tasks assessed/performed Overall Cognitive Status: Within Functional Limits for tasks assessed                                          Exercises Total Joint Exercises Ankle Circles/Pumps:  AROM, Both, 15 reps, Supine Quad Sets: AROM, Both, 10 reps, Supine Heel Slides: AAROM, Right, 15 reps, Supine Hip ABduction/ADduction: AAROM, Right, 15 reps, Supine Straight Leg Raises: AAROM, AROM, Right, Supine, 20 reps Goniometric ROM: AAROM R knee -8 - 80    General Comments        Pertinent Vitals/Pain Pain Assessment Pain Assessment: 0-10 Pain Score: 7  Pain Location: R knee Pain Descriptors / Indicators:  Aching, Sore Pain Intervention(s): Limited activity within patient's tolerance, Monitored during session, Premedicated before session, Ice applied    Home Living                          Prior Function            PT Goals (current goals can now be found in the care plan section) Acute Rehab PT Goals Patient Stated Goal: Regain IND PT Goal Formulation: With patient Time For Goal Achievement: 01/16/22 Potential to Achieve Goals: Good Progress towards PT goals: Not progressing toward goals - comment (pain, fatigue, instability, increased WOB)    Frequency    7X/week      PT Plan Current plan remains appropriate    Co-evaluation              AM-PAC PT "6 Clicks" Mobility   Outcome Measure  Help needed turning from your back to your side while in a flat bed without using bedrails?: A Little Help needed moving from lying on your back to sitting on the side of a flat bed without using bedrails?: A Little Help needed moving to and from a bed to a chair (including a wheelchair)?: A Little Help needed standing up from a chair using your arms (e.g., wheelchair or bedside chair)?: A Little Help needed to walk in hospital room?: A Little Help needed climbing 3-5 steps with a railing? : A Lot 6 Click Score: 17    End of Session Equipment Utilized During Treatment: Gait belt Activity Tolerance: Patient limited by fatigue;Patient limited by pain Patient left: in chair;with call bell/phone within reach Nurse Communication: Mobility status PT Visit Diagnosis: Difficulty in walking, not elsewhere classified (R26.2)     Time: 6759-1638 PT Time Calculation (min) (ACUTE ONLY): 30 min  Charges:  $Gait Training: 8-22 mins $Therapeutic Exercise: 8-22 mins                     Debe Coder PT Acute Rehabilitation Services Pager [phone] Office [phone]    Annita Ratliff 01/11/2022, 10:02 AM

## 2022-01-11 NOTE — Progress Notes (Signed)
Assessment unchanged. Pt and son verbalized understanding of dc instructions through teach back including medications and follow up care. Understands when to call the MD office or 911. Discharged via wc to front entrance accompanied by NT and son.

## 2022-01-11 NOTE — Progress Notes (Signed)
Physical Therapy Treatment Patient Details Name: Scott [name] MRN: [id number] DOB: 1935-10-25 Today's Date: 01/11/2022   History of Present Illness Pt s/p R TKR and with hx of CAD, MI, bil carpal tunnel, R RCR and L TKR    PT Comments    Pt continues motivated and with some improvement noted in activity tolerance and stability on feet but continues limited by pain and fatigues easily.  Pt requiring increased time for all tasks and multiple rest breaks throughout session but able to ambulate limited distance in hall, negotiate stairs, review bed mobility and review written instructions for stairs and for HEP.  Pt hopeful for HHPT rather than OP PT.   Recommendations for follow up therapy are one component of a multi-disciplinary discharge planning process, led by the attending physician.  Recommendations may be updated based on patient status, additional functional criteria and insurance authorization.  Follow Up Recommendations  Follow physician's recommendations for discharge plan and follow up therapies     Assistance Recommended at Discharge Frequent or constant Supervision/Assistance  Patient can return home with the following A little help with walking and/or transfers;A little help with bathing/dressing/bathroom;Assist for transportation;Help with stairs or ramp for entrance;Assistance with cooking/housework   Equipment Recommendations  None recommended by PT    Recommendations for Other Services       Precautions / Restrictions Precautions Precautions: Knee;Fall Required Braces or Orthoses: Knee Immobilizer - Right Knee Immobilizer - Right: Discontinue once straight leg raise with < 10 degree lag Restrictions Weight Bearing Restrictions: No RLE Weight Bearing: Weight bearing as tolerated Other Position/Activity Restrictions: WBAT     Mobility  Bed Mobility Overal bed mobility: Needs Assistance Bed Mobility: Sit to Supine     Supine to sit: HOB elevated, Min guard Sit  to supine: Min guard   General bed mobility comments: Increased time    Transfers Overall transfer level: Needs assistance Equipment used: Rolling walker (2 wheels) Transfers: Sit to/from Stand Sit to Stand: Supervision           General transfer comment: cues for LE management and use of UEs to self assist    Ambulation/Gait Ambulation/Gait assistance: Min guard, Supervision Gait Distance (Feet): 64 Feet Assistive device: Rolling walker (2 wheels) Gait Pattern/deviations: Step-to pattern, Decreased step length - right, Decreased step length - left, Shuffle, Trunk flexed Gait velocity: decr     General Gait Details: cues for posture, position from RW and sequence.  Distance ltd by pain/fatigue   Stairs Stairs: Yes Stairs assistance: Min assist Stair Management: No rails, Step to pattern, Backwards, Forwards, With walker, With crutches Number of Stairs: 6 General stair comments: 2 stairs 3x - twice fwd with crutches and once bkwd with RW   Wheelchair Mobility    Modified Rankin (Stroke Patients Only)       Balance Overall balance assessment: Needs assistance Sitting-balance support: No upper extremity supported, Feet supported Sitting balance-Leahy Scale: Good     Standing balance support: No upper extremity supported Standing balance-Leahy Scale: Fair                              Cognition Arousal/Alertness: Awake/alert Behavior During Therapy: WFL for tasks assessed/performed Overall Cognitive Status: Within Functional Limits for tasks assessed  Exercises Total Joint Exercises Ankle Circles/Pumps: AROM, Both, 15 reps, Supine Quad Sets: AROM, Both, 10 reps, Supine Heel Slides: AAROM, Right, 15 reps, Supine Hip ABduction/ADduction: AAROM, Right, 15 reps, Supine Straight Leg Raises: AAROM, AROM, Right, Supine, 20 reps Goniometric ROM: AAROM R knee -8 - 80    General Comments         Pertinent Vitals/Pain Pain Assessment Pain Assessment: 0-10 Pain Score: 6  Pain Location: R knee Pain Descriptors / Indicators: Aching, Sore Pain Intervention(s): Limited activity within patient's tolerance, Monitored during session, Premedicated before session, Ice applied    Home Living                          Prior Function            PT Goals (current goals can now be found in the care plan section) Acute Rehab PT Goals Patient Stated Goal: Regain IND PT Goal Formulation: With patient Time For Goal Achievement: 01/16/22 Potential to Achieve Goals: Good Progress towards PT goals: Progressing toward goals    Frequency    7X/week      PT Plan Current plan remains appropriate    Co-evaluation              AM-PAC PT "6 Clicks" Mobility   Outcome Measure  Help needed turning from your back to your side while in a flat bed without using bedrails?: A Little Help needed moving from lying on your back to sitting on the side of a flat bed without using bedrails?: A Little Help needed moving to and from a bed to a chair (including a wheelchair)?: A Little Help needed standing up from a chair using your arms (e.g., wheelchair or bedside chair)?: A Little Help needed to walk in hospital room?: A Little Help needed climbing 3-5 steps with a railing? : A Little 6 Click Score: 18    End of Session Equipment Utilized During Treatment: Gait belt Activity Tolerance: Patient limited by fatigue;Patient limited by pain Patient left: in bed;with call bell/phone within reach;with bed alarm set Nurse Communication: Mobility status PT Visit Diagnosis: Difficulty in walking, not elsewhere classified (R26.2)     Time: 0160-1093 PT Time Calculation (min) (ACUTE ONLY): 44 min  Charges:  $Gait Training: 23-37 mins $Therapeutic Exercise: 8-22 mins $Therapeutic Activity: 8-22 mins                     Debe Coder PT Acute Rehabilitation Services Pager  [phone] Office [phone]    Scott [name] 01/11/2022, 12:27 PM

## 2022-01-11 NOTE — Plan of Care (Signed)
  Problem: Acute Rehab PT Goals(only PT should resolve) Goal: Pt Will Go Supine/Side To Sit Outcome: Adequate for Discharge Goal: Pt Will Go Sit To Supine/Side Outcome: Adequate for Discharge Goal: Patient Will Transfer Sit To/From Stand Outcome: Adequate for Discharge Goal: Pt Will Ambulate Outcome: Adequate for Discharge Goal: Pt Will Go Up/Down Stairs Outcome: Adequate for Discharge

## 2022-01-12 ENCOUNTER — Encounter (HOSPITAL_COMMUNITY): Payer: Self-pay | Admitting: Orthopedic Surgery

## 2022-01-21 NOTE — Discharge Summary (Signed)
In most cases prophylactic antibiotics for Dental procdeures after total joint surgery are not necessary.  Exceptions are as follows:  1. History of prior total joint infection  2. Severely immunocompromised (Organ Transplant, cancer chemotherapy, Rheumatoid biologic meds such as [place])  3. Poorly controlled diabetes (A1C &gt; 8.0, blood glucose over 200)  If you have one of these conditions, contact your surgeon for an antibiotic prescription, prior to your dental procedure. Orthopedic Discharge Summary        Physician Discharge Summary  Patient ID: Scott [name] MRN: [id number] DOB/AGE: 1936-01-31 86 y.o.  Admit date: 01/09/2022 Discharge date: 01/21/2022   Procedures:  Procedure(s) (LRB): TOTAL KNEE ARTHROPLASTY (Right)  Attending Physician:  Dr. Esmond Plants  Admission Diagnoses:   right knee end stage osteoarthritis  Discharge Diagnoses:  right knee end stage osteoarthritis   Past Medical History:  Diagnosis Date   Acid reflux    history of Barretts esophagus    Adenomatous colon polyp 01/2002   Arthritis 2004   Coronary artery disease 2007   cardiac cath w/ angioplasty & stent placement x 1   Enlarged prostate    Hyperlipidemia    Hypertension    Myocardial infarction ([place]) 2007   while vacationing in [place], cath. done there & x1 stent placed    Neuromuscular disorder ([place])    carpal tunnel - both hands    Prostate cancer ([place]) 2013   biopsy- active survelliance, annual biopsies    Sepsis secondary to UTI [facility])    Shortness of breath dyspnea    Sleep apnea 2001   Venous insufficiency     PCP: Janie Morning, DO   Discharged Condition: good  Hospital Course:  Patient underwent the above stated procedure on 01/09/2022. Patient tolerated the procedure well and brought to the recovery room in good condition and subsequently to the floor. Patient had an uncomplicated hospital course and was stable for discharge.   Disposition: Discharge disposition:  01-Home or Self Care      with follow up in 2 weeks    Follow-up Information     Netta Cedars, MD. Call in 2 week(s).   Specialty: Orthopedic Surgery Why: call [phone] for appt Contact information: [address]  [place] [address] [phone]                 Dental Antibiotics:  In most cases prophylactic antibiotics for Dental procdeures after total joint surgery are not necessary.  Exceptions are as follows:  1. History of prior total joint infection  2. Severely immunocompromised (Organ Transplant, cancer chemotherapy, Rheumatoid biologic meds such as [place])  3. Poorly controlled diabetes (A1C &gt; 8.0, blood glucose over 200)  If you have one of these conditions, contact your surgeon for an antibiotic prescription, prior to your dental procedure.    Allergies as of 01/11/2022       Reactions   Shellfish Allergy Nausea And Vomiting   Sulfa Antibiotics Other (See Comments)   Feels fatigued and weak when taking sulfa for long durations         Medication List     TAKE these medications    Advair HFA 115-21 MCG/ACT inhaler Generic drug: fluticasone-salmeterol Inhale 2 puffs into the lungs 2 (two) times daily.   alfuzosin 10 MG 24 hr tablet Commonly known as: UROXATRAL Take 10 mg by mouth at bedtime.   aspirin EC 81 MG tablet Take 1 tablet (81 mg total) by mouth 2 (two) times daily. What changed:  when to take this   atorvastatin 40 MG tablet Commonly known as: LIPITOR Take 40 mg by mouth daily with supper.   cetirizine 10 MG tablet Commonly known as: ZYRTEC Take 10 mg by mouth daily with breakfast.   docusate sodium 100 MG capsule Commonly known as: COLACE Take 1 capsule (100 mg total) by mouth 2 (two) times daily.   eplerenone 25 MG tablet Commonly known as: INSPRA TAKE 1 TABLET BY MOUTH IN  THE MORNING   ezetimibe 10 MG tablet Commonly known as: ZETIA TAKE 1 TABLET BY MOUTH  DAILY AFTER SUPPER What  changed: See the new instructions.   finasteride 5 MG tablet Commonly known as: PROSCAR Take 5 mg by mouth daily with supper.   fluticasone 0.05 % cream Commonly known as: CUTIVATE Apply 1 application. topically See admin instructions. Mix 1 part ketoconazole cream & 1 part fluticasone cream together and apply to skin irritation on buttock area(s) up to twice daily   fluticasone 50 MCG/ACT nasal spray Commonly known as: FLONASE Place into both nostrils daily.   gabapentin 300 MG capsule Commonly known as: NEURONTIN Take 300 mg by mouth 2 (two) times daily.   ketoconazole 2 % cream Commonly known as: NIZORAL Apply 1 application. topically See admin instructions. Mix 1 part ketoconazole cream & 1 part fluticasone cream together and apply to skin irritation on buttock area(s) up to twice daily   montelukast 10 MG tablet Commonly known as: SINGULAIR Take 10 mg by mouth daily with breakfast.   multivitamin with minerals Tabs tablet Take 1 tablet by mouth at bedtime.   ondansetron 4 MG tablet Commonly known as: Zofran Take 1 tablet (4 mg total) by mouth every 8 (eight) hours as needed for nausea, vomiting or refractory nausea / vomiting.   oxybutynin 5 MG tablet Commonly known as: DITROPAN Take 5 mg by mouth 2 (two) times daily.   oxyCODONE-acetaminophen 5-325 MG tablet Commonly known as: Percocet Take 1 tablet by mouth every 4 (four) hours as needed for severe pain.   pantoprazole 20 MG tablet Commonly known as: PROTONIX Take 20 mg by mouth daily before breakfast.   Pfizer COVID-19 Vac Bivalent injection Generic drug: COVID-19 mRNA bivalent vaccine (Pfizer) Inject into the muscle.   polyethylene glycol 17 g packet Commonly known as: MIRALAX / GLYCOLAX Take 17 g by mouth daily as needed for mild constipation.   tiZANidine 2 MG tablet Commonly known as: ZANAFLEX Take 1 tablet (2 mg total) by mouth every 8 (eight) hours as needed for muscle spasms.   vitamin C 1000 MG  tablet Take 1,000 mg by mouth daily with breakfast.          Signed: Ventura Bruns 01/21/2022, 10:26 AM  [facility] is now [facility] [address], [place], [place], [place] [address] Phone: [place]

## 2022-02-24 ENCOUNTER — Other Ambulatory Visit: Payer: Self-pay | Admitting: Cardiology

## 2022-02-24 DIAGNOSIS — I482 Chronic atrial fibrillation, unspecified: Secondary | ICD-10-CM

## 2022-03-20 ENCOUNTER — Other Ambulatory Visit: Payer: Self-pay | Admitting: Pulmonary Disease

## 2022-03-25 ENCOUNTER — Other Ambulatory Visit: Payer: Self-pay | Admitting: Cardiology

## 2022-03-25 DIAGNOSIS — E7800 Pure hypercholesterolemia, unspecified: Secondary | ICD-10-CM

## 2022-09-10 ENCOUNTER — Encounter: Payer: Self-pay | Admitting: Internal Medicine

## 2022-09-10 ENCOUNTER — Ambulatory Visit (INDEPENDENT_AMBULATORY_CARE_PROVIDER_SITE_OTHER): Payer: Medicare Other | Admitting: Internal Medicine

## 2022-09-10 VITALS — BP 136/68 | HR 59 | Temp 97.9°F | Ht 64.0 in | Wt 169.4 lb

## 2022-09-10 DIAGNOSIS — Z87898 Personal history of other specified conditions: Secondary | ICD-10-CM | POA: Diagnosis not present

## 2022-09-10 DIAGNOSIS — J4540 Moderate persistent asthma, uncomplicated: Secondary | ICD-10-CM | POA: Diagnosis not present

## 2022-09-10 DIAGNOSIS — R0989 Other specified symptoms and signs involving the circulatory and respiratory systems: Secondary | ICD-10-CM | POA: Diagnosis not present

## 2022-09-10 NOTE — Patient Instructions (Addendum)
ICD-10-CM   1. Moderate persistent asthma without complication  N81.77     2. History of wheezing  Z87.898     3. Pulmonary air trapping  R09.89      - clinically stable and well controlle - glad uptodate with vaccines - cxr clear in aug 2022   Plan  -continue advair scheduled daily with albuterol as needed  Follow-up -6 months or sooner if needed

## 2022-09-10 NOTE — Progress Notes (Signed)
OV 04/11/2020  Subjective:  Patient ID: Scott [name], male , DOB: Jul 31, 1936 , age 87 y.o. , MRN: [id number] , ADDRESS: [place] [address]  PCP   Jani Gravel, MD  04/11/2020 -   Chief Complaint  Patient presents with   Consult    DOE     HPI Scott [name] 87 y.o. -referred by for shortness of breath on exertion reli relieved by rest.  He is here at the request of his wife.  Patient is well-known to me.  I I take care of his wife.  He tells me for the last several years he has had insidious onset of shortness of breath with exertion.  Gradually and steadily getting worse.  Dyspnea is rated as a 5 out of 10.  He has associated cough that is rated as a 3 out of 10 and wheezing that is rated as 1 out of 10.  He is associated heavy seasonal allergies that is actually perennial now.  He says when he stops his antihistamines his symptoms come back pretty rapidly.  Nevertheless shortness of breath is experienced when he does grocery shopping or climbs 2 flights of stairs.  He does not have symptoms of one flight of stairs.  When he does grocery shopping his knees bother him more than shortness of breath but when he climbs stairs 2 flights shortness of breath bothers him more than the knees.  There is no associated chest pain.  He had a cardiac stress test this year and it is normal and documented below.  His last CT scan of the chest was in 2017 where he had some pleural effusions.  Of note his shortness of breath is progressive but his cough and wheezing is static  Lab review shows mild anemia in 2017 but no further labs with [place]  He also has mild chronic kidney disease last checked within our health system in 2017.  In 2017 CT chest did show small pericardial effusion  His most recent echocardiogram in June 2021 shows grade 1 diastolic dysfunction.  He has associated visceral obesity.  There is no orthopnea.  He has some mild chronic venous stasis edema particularly on the left  side.  There is no change to this.  There is no hemoptysis.   He has never had allergy tests  Simple office walk 185 feet x  3 laps goal with forehead probe 04/11/2020   O2 used ra  Number laps completed 3  Comments about pace avg  Resting Pulse Ox/HR 98% and 84/min  Final Pulse Ox/HR 98% and 82/min  Desaturated </= 88% no  Desaturated <= 3% points no  Got Tachycardic >/= 90/min no  Symptoms at end of test No dyspnea  Miscellaneous comments x      CT chest June 2017  IMPRESSION: Small bilateral pleural effusions and mild atelectasis/scarring over the lingula and left lower lobe.   Small pericardial effusion.   Three vessel atherosclerotic coronary artery disease. Aortic atherosclerosis.   Right-sided aortic arch with aberrant left subclavian artery.     Electronically Signed   By: Marin Olp M.D.   On: 01/30/2016 15:56   ROS Echocardiogram February 07, 2020  Echocardiogram 02/07/2020:  Normal LV systolic function with EF 59%. Left ventricle cavity is normal  in size. Mild concentric hypertrophy of the left ventricle. Normal global  wall motion. Doppler evidence of grade I (impaired) diastolic dysfunction,  normal LAP. Calculated EF 59%.  Left  atrial cavity is moderately dilated.  Structurally normal mitral valve.  Mild (Grade I) mitral regurgitation.  Pericardium is normal. Insignificant pericardial effusion with clear  fluids.  IVC is dilated with respiratory variation. This may suggest elevated right  heart pressure  No significant change from 05/09/2015.  Lexiscan/modified Bruce Tetrofosmin stress test 02/14/2020: Lexiscan/modified Bruce nuclear stress test performed using 1-day protocol. Stress EKG is non-diagnostic, as this is pharmacological stress test. In addition, stress EKG at 110% MPHR showed sinus tachycardia, old inferior infarct. Mild decrease in reduced radiotracer update in inferior myocardium at rest and stress. In setting of normal myocardial  thickening, this likely represents tissue attenuation artifact. Stress LVEF 77%. Low risk study.   OV 05/15/2020  Subjective:  Patient ID: Scott [name], male , DOB: 10-03-1935 , age 39 y.o. , MRN: [id number] , ADDRESS: [address] [place] [address] PCP Jani Gravel, MD Relevant Other Providers: - [name] This Provider for this visit: Treatment Team:  Attending Provider: Brand Males, MD    05/15/2020 -   Chief Complaint  Patient presents with   Follow-up    CT scan 9/13, PFT Today wants to talk about results of both, breathing the same as last visit shortness of breath with exertion     HPI Scott [name] 87 y.o. -here to discuss test results.  Pulmonary function test essentially normal..  CT scan shows cirrhosis with trace pleural effusion and some air trapping but otherwise normal.  He is not aware of the fact that he has cirrhosis.  This was followed up with a right upper quadrant ultrasound that did not show cirrhosis but does asymptomatic gallstones he denies any drinking.  There is no interstitial lung disease or pneumonia or cancer.  However he still has symptoms.  At this point in time his symptoms are minimal.  He states that his main priority is to make sure there is nothing acutely abnormal with his lungs explain his symptoms.  He is willing to try an empiric inhaler    CLINICAL DATA:  Increased shortness of breath on exertion, wheezing and chronic cough.   EXAM: CT CHEST WITHOUT CONTRAST   TECHNIQUE: Multidetector CT imaging of the chest was performed following the standard protocol without intravenous contrast. High resolution imaging of the lungs, as well as inspiratory and expiratory imaging, was performed.   COMPARISON:  01/30/2016.   FINDINGS: Cardiovascular: Right-sided aortic arch. Atherosclerotic calcification of the aorta, aortic valve and coronary arteries. Heart is enlarged. Small amount of pericardial fluid is unchanged.   Mediastinum/Nodes:  No pathologically enlarged mediastinal or axillary lymph nodes. Hilar regions are difficult to definitively evaluate without IV contrast. There are calcified right hilar lymph nodes. Esophagus is grossly unremarkable.   Lungs/Pleura: Image quality is degraded by respiratory motion. Negative for subpleural reticulation, traction bronchiectasis/bronchiolectasis, ground-glass, architectural distortion or honeycombing. Trace right pleural fluid. Airway is unremarkable. There is air trapping.   Upper Abdomen: Liver margin is irregular. Stones layer in the gallbladder. Adrenal glands are unremarkable. Low-attenuation lesions in the kidneys measure up to 3.2 cm on the right, incompletely imaged. Visualized portions of the spleen, pancreas, stomach and bowel are unremarkable with the exception of a small hiatal hernia.   Musculoskeletal: Degenerative changes in the spine and shoulders. No worrisome lytic or sclerotic lesions. Old rib fractures.   IMPRESSION: 1. Negative for interstitial lung disease. Air trapping is indicative of small airways disease. 2. Trace right pleural effusion. 3. Cirrhosis. 4. Cholelithiasis. 5. Aortic atherosclerosis (ICD10-I70.0).  Coronary artery calcification.     Electronically Signed   By: Lorin Picket M.D.   On: 04/22/2020 12:06   IMPRESSION: Cholelithiasis without evidence of cholecystitis. No other abnormality seen in the right upper quadrant of the abdomen.     Electronically Signed   By: Marijo Conception M.D.   On: 05/01/2020 16:11 ROS - per HPI  Results for Rishel, DAMIEAN LUKES "JOE" (MRN [id number]) as of 05/15/2020 10:50  Ref. Range 04/11/2020 10:02  IgE (Immunoglobulin E), Serum Latest Ref Range: <OR=114 kU/L 56  R  Results for Keller, AMEER SANDEN" (MRN [id number]) as of 05/15/2020 10:50  Ref. Range 04/11/2020 10:02  Eosinophils Absolute Latest Ref Range: 0 - 0 K/uL 0.2  Results for Sircy, WEBER MONNIER" (MRN [id number]) as of 05/15/2020 10:50  Ref.  Range 04/11/2020 10:02  Hemoglobin Latest Ref Range: 13.0 - 17.0 g/dL 13.6    OV 03/24/2021  Subjective:  Patient ID: Scott [name], male , DOB: Jul 17, 1936 , age 37 y.o. , MRN: [id number] , ADDRESS: [place] [address] PCP Jani Gravel, MD Patient Care Team: Jani Gravel, MD as PCP - General (Internal Medicine)  This Provider for this visit: Treatment Team:  Attending Provider: Brand Males, MD    03/24/2021 -   Chief Complaint  Patient presents with   Follow-up    Pt states he has been doing okay since last visit.     HPI Liborio Saccente Aderman 87 y.o. -follow-up for dyspnea, history of wheezing, chronic cough with the only evidence of asthma being pulmonary air-trapping seen on previous PFT and CT scan [also found to have subclinical cirrhosis and some trace pleural effusion on CT scan a year ago] at this visit he says he has been busy taking care of his wife Almedia Balls with urologic issues.  His sister-in-law also has been sick earlier in the year.  He says that he is using Advair right now because it is cheaper than air duo.  He tells me that he does not think the Advair has helped his symptoms but he believes that his wife has reported that his cough and shortness of breath and wheezing is significantly better.  Therefore he is inclined to continue with it.  He says other than getting older and feeling a generally little more tired he has no other health issues.    OV 09/10/2022  Subjective:  Patient ID: Scott [name], male , DOB: April 22, 1936 , age 89 y.o. , MRN: [id number] , ADDRESS: [place] [id number] PCP Janie Morning, DO Patient Care Team: Janie Morning, DO as PCP - General (Family Medicine)  This Provider for this visit: Treatment Team:  Attending Provider: Brand Males, MD    09/10/2022 -   Chief Complaint  Patient presents with   Follow-up    Wheezing both at rest and exertion x 3 months.  Some cough. Pt feels inhaler (Advair)  does not help with  DOE.     HPI Evian Salguero Causby 87 y.o. -returns for his asthma follow-up.  I personally not seen him in nearly 2 years.  He is currently taking Advair and Singulair.  He says he is stable.  He says he is on and off wheezing but that does not bother him.  He feels his asthma is well-controlled.  He is up-to-date with all his respiratory vaccines.  No albuterol rescue use.  No prednisone usage no hospitalizations no emergency room visits no urgent care visits.  Of note his wife Delsa Bern was my patient with chronic cough and DIPNECH passed away in 2022/05/25 from frequent UTIs.  She was in hospice.  He is now taking care of her Sister Ancil Linsey was heart failure.  He was in tears talking about pulmonary I sleep.REvioeed Last CXR summer 2022 and clear   Latest Reference Range & Units 08/12/12 13:24 01/29/16 13:34 01/30/16 11:33 01/31/16 05:58 06/30/16 11:38 07/10/16 14:18 07/11/16 06:12 07/13/16 03:13 04/11/20 10:02 12/29/21 13:36 01/10/22 03:48  Creatinine 0.61 - 1.24 mg/dL 1.2 1.35 (H) 1.37 (H) 1.32 (H) 1.11 1.18 1.26 (H) 1.31 (H) 1.25 1.12 1.20  (H): Data is abnormally high  Latest Reference Range & Units 08/12/12 13:24 01/29/16 13:34 01/31/16 05:58 06/30/16 11:38 07/10/16 14:18 07/11/16 06:12 07/12/16 03:44 07/13/16 03:13 04/11/20 10:02 12/29/21 13:36 01/10/22 03:48 01/11/22 03:32  Hemoglobin 13.0 - 17.0 g/dL 14.4 13.2 11.3 (L) 13.9 13.5 12.3 (L) 11.9 (L) 11.1 (L) 13.6 13.3 11.9 (L) 12.3 (L)  (L): Data is abnormally low PFT     Latest Ref Rng & Units 05/15/2020    8:45 AM  PFT Results  FVC-Pre L 3.14   FVC-Predicted Pre % 97   FVC-Post L 3.18   FVC-Predicted Post % 99   Pre FEV1/FVC % % 82   Post FEV1/FCV % % 87   FEV1-Pre L 2.56   FEV1-Predicted Pre % 114   FEV1-Post L 2.75   DLCO uncorrected ml/min/mmHg 23.17   DLCO UNC% % 110   DLCO corrected ml/min/mmHg 23.87   DLCO COR %Predicted % 114   DLVA Predicted % 114   TLC L 5.83   TLC % Predicted % 94   RV % Predicted % 101         has a past medical history of Acid reflux, Adenomatous colon polyp (01/2002), Arthritis (2004), Coronary artery disease (2007), Enlarged prostate, Hyperlipidemia, Hypertension, Myocardial infarction ([place]) (2007), Neuromuscular disorder ([place]), Prostate cancer ([place]) (2013), Sepsis secondary to UTI [facility]), Shortness of breath dyspnea, Sleep apnea (2001), and Venous insufficiency.   reports that he has never smoked. He has never used smokeless tobacco.  Past Surgical History:  Procedure Laterality Date   ANGIOPLASTY  09/18/2005   1 stent placed   [place] BIOPSY  12/30/2000   ROTATOR CUFF REPAIR  2004   right   TONSILLECTOMY AND ADENOIDECTOMY  1950   TOTAL KNEE ARTHROPLASTY Left 07/10/2016   Procedure: TOTAL KNEE ARTHROPLASTY;  Surgeon: Netta Cedars, MD;  Location: [facility];  Service: Orthopedics;  Laterality: Left;   TOTAL KNEE ARTHROPLASTY Right 01/09/2022   Procedure: TOTAL KNEE ARTHROPLASTY;  Surgeon: Netta Cedars, MD;  Location: [facility];  Service: Orthopedics;  Laterality: Right;   VASECTOMY  1970    Allergies  Allergen Reactions   Shellfish Allergy Nausea And Vomiting   Sulfa Antibiotics Other (See Comments)    Feels fatigued and weak when taking sulfa for long durations     Immunization History  Administered Date(s) Administered   PFIZER(Purple Top)SARS-COV-2 Vaccination 09/01/2019, 09/22/2019, 05/14/2020   Pfizer Covid-19 Vaccine Bivalent Booster 74yr & up 05/01/2021    Family History  Problem Relation Age of Onset   Prostate cancer Brother    Diabetes Father    Heart disease Father    Diabetes Sister    Diabetes Brother    Heart disease Mother    Diabetes Son      Current Outpatient Medications:    ADVAIR HFA 115-21 MCG/ACT inhaler, USE 2  INHALATIONS BY MOUTH  TWICE DAILY, Disp: 12 g, Rfl: 2   alfuzosin (UROXATRAL) 10 MG 24 hr tablet, Take 10 mg by mouth at bedtime., Disp: , Rfl:    Ascorbic Acid (VITAMIN C) 1000 MG tablet, Take 1,000 mg by mouth daily  with breakfast., Disp: , Rfl:    atorvastatin (LIPITOR) 40 MG tablet, Take 40 mg by mouth daily with supper., Disp: , Rfl:    cetirizine (ZYRTEC) 10 MG tablet, Take 10 mg by mouth daily with breakfast., Disp: , Rfl:    eplerenone (INSPRA) 25 MG tablet, TAKE 1 TABLET BY MOUTH IN  THE MORNING, Disp: 90 tablet, Rfl: 3   ezetimibe (ZETIA) 10 MG tablet, TAKE 1 TABLET BY MOUTH  DAILY AFTER SUPPER, Disp: 90 tablet, Rfl: 3   finasteride (PROSCAR) 5 MG tablet, Take 5 mg by mouth daily with supper., Disp: , Rfl:    fluticasone (CUTIVATE) 0.05 % cream, Apply 1 application. topically See admin instructions. Mix 1 part ketoconazole cream & 1 part fluticasone cream together and apply to skin irritation on buttock area(s) up to twice daily, Disp: , Rfl:    fluticasone (FLONASE) 50 MCG/ACT nasal spray, Place into both nostrils daily., Disp: , Rfl:    FLUTICASONE-EMOLLIENT EX, Fluticasone-Emollient, Disp: , Rfl:    gabapentin (NEURONTIN) 300 MG capsule, Take 300 mg by mouth 2 (two) times daily., Disp: , Rfl:    ketoconazole (NIZORAL) 2 % cream, Apply 1 application. topically See admin instructions. Mix 1 part ketoconazole cream & 1 part fluticasone cream together and apply to skin irritation on buttock area(s) up to twice daily, Disp: , Rfl:    montelukast (SINGULAIR) 10 MG tablet, Take 10 mg by mouth daily with breakfast., Disp: , Rfl:    Multiple Vitamin (MULTIVITAMIN WITH MINERALS) TABS tablet, Take 1 tablet by mouth at bedtime., Disp: , Rfl:    oxybutynin (DITROPAN) 5 MG tablet, Take 5 mg by mouth 2 (two) times daily., Disp: , Rfl:    pantoprazole (PROTONIX) 20 MG tablet, Take 20 mg by mouth daily before breakfast., Disp: , Rfl:    COVID-19 mRNA bivalent vaccine, Pfizer, injection, Inject into the muscle. (Patient not taking: Reported on 09/10/2022), Disp: 0.3 mL, Rfl: 0   docusate sodium (COLACE) 100 MG capsule, Take 1 capsule (100 mg total) by mouth 2 (two) times daily. (Patient not taking: Reported on 09/10/2022),  Disp: 10 capsule, Rfl: 0   ondansetron (ZOFRAN) 4 MG tablet, Take 1 tablet (4 mg total) by mouth every 8 (eight) hours as needed for nausea, vomiting or refractory nausea / vomiting. (Patient not taking: Reported on 09/10/2022), Disp: 30 tablet, Rfl: 1   oxyCODONE-acetaminophen (PERCOCET) 5-325 MG tablet, Take 1 tablet by mouth every 4 (four) hours as needed for severe pain. (Patient not taking: Reported on 09/10/2022), Disp: 30 tablet, Rfl: 0   polyethylene glycol (MIRALAX / GLYCOLAX) 17 g packet, Take 17 g by mouth daily as needed for mild constipation. (Patient not taking: Reported on 09/10/2022), Disp: 14 each, Rfl: 0   tiZANidine (ZANAFLEX) 2 MG tablet, Take 1 tablet (2 mg total) by mouth every 8 (eight) hours as needed for muscle spasms. (Patient not taking: Reported on 09/10/2022), Disp: 30 tablet, Rfl: 0      Objective:   Vitals:   09/10/22 0929  BP: 136/68  Pulse: (!) 59  Temp: 97.9 F (36.6 C)  TempSrc: Oral  SpO2: 97%  Weight: 169 lb 6.4 oz (76.8 kg)  Height: '5\' 4"'$  (1.626 m)  Estimated body mass index is 29.08 kg/m as calculated from the following:   Height as of this encounter: '5\' 4"'$  (1.626 m).   Weight as of this encounter: 169 lb 6.4 oz (76.8 kg).  '@WEIGHTCHANGE'$ @  [employer]   09/10/22 0929  Weight: 169 lb 6.4 oz (76.8 kg)     Physical Exam    General: No distress. Look swel. HEARING AID + Neuro: Alert and Oriented x 3. GCS 15. Speech normal Psych: Pleasant Resp:  Barrel Chest - no.  Wheeze - no, Crackles - no, No overt respiratory distress CVS: Normal heart sounds. Murmurs - no Ext: Stigmata of Connective Tissue Disease - no HEENT: Normal upper airway. PEERL +. No post nasal drip        Assessment:       ICD-10-CM   1. Moderate persistent asthma without complication  O75.64     2. History of wheezing  Z87.898     3. Pulmonary air trapping  R09.89          Plan:     Patient Instructions     ICD-10-CM   1. Moderate persistent asthma without  complication  P32.95     2. History of wheezing  Z87.898     3. Pulmonary air trapping  R09.89      - clinically stable and well controlle - glad uptodate with vaccines - cxr clear in aug 2022   Plan  -continue advair scheduled daily with albuterol as needed  Follow-up -6 months or sooner if needed    Moderate Complexity MDM NEW OFFICE  The table below is from the 2021 E/M guidelines, first released in 2021, with minor revisions added in 2023. Must meet the requirements for 2 out of 3 dimensions to qualify.    Number and complexity of problems addressed Amount and/or complexity of data reviewed Risk of complications and/or morbidity  One or more chronic illness with mild exacerbation, progression, or side effects of treatment  Two or more stable chronic illnesses  One undiagnosed new problem with uncertain prognosis  One acute illness with systemic symptoms   Acute complicated injury Must meet the requirements for 1 of 3 of the categories)  Category 1: Tests and documents, historian  Any combination of 3 of the following:  Assessment requiring an independent historian  Review of prior external records  Review of results of each unique test  Ordering of each unique test    Category 2: Interpretation of tests  Independent interpretation of a test perfromed by another physician/NPP  Category 3: Discuss management/tests  Discussion of magagement or tests with an external physician/NPP Prescription drug management  Decision regarding minor surgery with identfied patient or procedure risk factors  Decision regarding elective major surgery without identified patient or procedure risk factors  Diagnosis or treatment significantly limited by social determinants of health             SIGNATURE    Dr. Brand Males, M.D., F.C.C.P,  Pulmonary and Critical Care Medicine Staff Physician, [place] Director - Interstitial Lung Disease   Program  [facility] [place], [place], [address]  Pager: [phone], If no answer or between  15:00h - 7:00h: call [phone] Telephone: [phone]  12:26 PM 09/10/2022

## 2022-09-16 ENCOUNTER — Ambulatory Visit: Payer: Medicare Other | Admitting: Cardiology

## 2022-10-01 ENCOUNTER — Encounter: Payer: Self-pay | Admitting: Cardiology

## 2022-10-01 ENCOUNTER — Ambulatory Visit: Payer: Medicare Other | Admitting: Cardiology

## 2022-10-01 VITALS — BP 125/60 | HR 58 | Ht 64.0 in | Wt 170.4 lb

## 2022-10-01 DIAGNOSIS — E7800 Pure hypercholesterolemia, unspecified: Secondary | ICD-10-CM

## 2022-10-01 DIAGNOSIS — I2510 Atherosclerotic heart disease of native coronary artery without angina pectoris: Secondary | ICD-10-CM

## 2022-10-01 DIAGNOSIS — I10 Essential (primary) hypertension: Secondary | ICD-10-CM

## 2022-10-01 NOTE — Progress Notes (Signed)
Primary Physician/Referring:  Janie Morning, DO  Patient ID: Scott [name], male    DOB: 1935/10/22, 87 y.o.   MRN: [id number]  Chief Complaint  Patient presents with   Essential hypertension   Coronary artery disease involving native coronary artery of   Atrial fibrillation, chronic ([place])   Follow-up   HPI:    Scott [name]  is a 87 y.o. with hypertension, hyperlipidemia, obstructive sleep apnea on CPAP and is compliant, coronary artery disease and h/o PTCA Mid RCA 3.5x20 mm Taxus stent (DES) in 2007 when he presented with AMI while visiting [place].   Except for chronic dyspnea on exertion, no chest pain.  He denies any leg edema, no PND or orthopnea.  Past Medical History:  Diagnosis Date   Acid reflux    history of Barretts esophagus    Adenomatous colon polyp 01/2002   Arthritis 2004   Coronary artery disease 2007   cardiac cath w/ angioplasty & stent placement x 1   Enlarged prostate    Hyperlipidemia    Hypertension    Myocardial infarction ([place]) 2007   while vacationing in [place], cath. done there & x1 stent placed    Neuromuscular disorder ([place])    carpal tunnel - both hands    Prostate cancer ([place]) 2013   biopsy- active survelliance, annual biopsies    Sepsis secondary to UTI [facility])    Shortness of breath dyspnea    Sleep apnea 2001   Venous insufficiency    Past Surgical History:  Procedure Laterality Date   ANGIOPLASTY  09/18/2005   1 stent placed   [place] BIOPSY  12/30/2000   ROTATOR CUFF REPAIR  2004   right   TONSILLECTOMY AND ADENOIDECTOMY  1950   TOTAL KNEE ARTHROPLASTY Left 07/10/2016   Procedure: TOTAL KNEE ARTHROPLASTY;  Surgeon: Netta Cedars, MD;  Location: [place] [facility];  Service: Orthopedics;  Laterality: Left;   TOTAL KNEE ARTHROPLASTY Right 01/09/2022   Procedure: TOTAL KNEE ARTHROPLASTY;  Surgeon: Netta Cedars, MD;  Location: [facility];  Service: Orthopedics;  Laterality: Right;   VASECTOMY  1970   Social History   Tobacco Use    Smoking status: Never   Smokeless tobacco: Never  Substance Use Topics   Alcohol use: No   Marital Status: Married   ROS  Review of Systems  Cardiovascular:  Positive for dyspnea on exertion. Negative for chest pain and leg swelling.  Respiratory:  Positive for wheezing (occasaional).   Musculoskeletal:  Negative for arthritis and joint pain.  Gastrointestinal:  Negative for melena.   Objective      10/01/2022    9:44 AM 09/10/2022    9:29 AM 01/11/2022    5:42 AM  Vitals with BMI  Height 5' 4"$  5' 4"$    Weight 170 lbs 6 oz 169 lbs 6 oz   BMI [id number]   Systolic 0000000 [id number]  Diastolic 60 68 73  Pulse 58 59 80    Blood pressure 125/60, pulse (!) 58, height 5' 4"$  (1.626 m), weight 170 lb 6.4 oz (77.3 kg), SpO2 98 %. Body mass index is 29.25 kg/m.   Physical Exam Neck:     Vascular: No carotid bruit or JVD.  Cardiovascular:     Rate and Rhythm: Normal rate and regular rhythm.     Pulses: Intact distal pulses.     Heart sounds: Normal heart sounds. No murmur heard.    No gallop.  Pulmonary:  Effort: Pulmonary effort is normal.     Breath sounds: Normal breath sounds.  Abdominal:     General: Bowel sounds are normal.     Palpations: Abdomen is soft.  Musculoskeletal:     Right lower leg: No edema.     Left lower leg: No edema.    Laboratory examination:   External Labs:  02/12/2022:  Serum ferritin normal, iron levels normal, transferrin normal.  Labs 09/01/2022:  Total cholesterol 107, triglycerides 48, HDL 43, LDL 54.  A1c 6.4%.  Sodium 143, potassium 4.4, BUN 28, creatinine 1.19, EGFR 56 mL, LFTs normal.  Hb 11.8/HCT 36.3, platelets 171.   Medications and allergies   Allergies  Allergen Reactions   Other Other (See Comments)   Shellfish Allergy Nausea And Vomiting   Sulfa Antibiotics Other (See Comments)    Feels fatigued and weak when taking sulfa for long durations      Current Outpatient Medications:    ADVAIR HFA 115-21 MCG/ACT inhaler, USE  2 INHALATIONS BY MOUTH  TWICE DAILY, Disp: 12 g, Rfl: 2   alfuzosin (UROXATRAL) 10 MG 24 hr tablet, Take 10 mg by mouth at bedtime., Disp: , Rfl:    Ascorbic Acid (VITAMIN C) 1000 MG tablet, Take 1,000 mg by mouth daily with breakfast., Disp: , Rfl:    atorvastatin (LIPITOR) 40 MG tablet, Take 40 mg by mouth daily with supper., Disp: , Rfl:    cetirizine (ZYRTEC) 10 MG tablet, Take 10 mg by mouth daily with breakfast., Disp: , Rfl:    eplerenone (INSPRA) 25 MG tablet, TAKE 1 TABLET BY MOUTH IN  THE MORNING, Disp: 90 tablet, Rfl: 3   ezetimibe (ZETIA) 10 MG tablet, TAKE 1 TABLET BY MOUTH  DAILY AFTER SUPPER, Disp: 90 tablet, Rfl: 3   finasteride (PROSCAR) 5 MG tablet, Take 5 mg by mouth daily with supper., Disp: , Rfl:    fluticasone (CUTIVATE) 0.05 % cream, Apply 1 application. topically See admin instructions. Mix 1 part ketoconazole cream & 1 part fluticasone cream together and apply to skin irritation on buttock area(s) up to twice daily, Disp: , Rfl:    fluticasone (FLONASE) 50 MCG/ACT nasal spray, Place into both nostrils daily., Disp: , Rfl:    FLUTICASONE-EMOLLIENT EX, Fluticasone-Emollient, Disp: , Rfl:    gabapentin (NEURONTIN) 300 MG capsule, Take 300 mg by mouth 2 (two) times daily., Disp: , Rfl:    ketoconazole (NIZORAL) 2 % cream, Apply 1 application. topically See admin instructions. Mix 1 part ketoconazole cream & 1 part fluticasone cream together and apply to skin irritation on buttock area(s) up to twice daily, Disp: , Rfl:    montelukast (SINGULAIR) 10 MG tablet, Take 10 mg by mouth daily with breakfast., Disp: , Rfl:    Multiple Vitamin (MULTIVITAMIN WITH MINERALS) TABS tablet, Take 1 tablet by mouth at bedtime., Disp: , Rfl:    oxybutynin (DITROPAN) 5 MG tablet, Take 5 mg by mouth 2 (two) times daily., Disp: , Rfl:    pantoprazole (PROTONIX) 20 MG tablet, Take 20 mg by mouth daily before breakfast., Disp: , Rfl:    COVID-19 mRNA bivalent vaccine, Pfizer, injection, Inject into  the muscle. (Patient not taking: Reported on 09/10/2022), Disp: 0.3 mL, Rfl: 0   No orders of the defined types were placed in this encounter.  Medications Discontinued During This Encounter  Medication Reason   docusate sodium (COLACE) 100 MG capsule Patient Preference   ondansetron (ZOFRAN) 4 MG tablet Patient Preference   oxyCODONE-acetaminophen (PERCOCET) 5-325 MG  tablet Patient Preference   polyethylene glycol (MIRALAX / GLYCOLAX) 17 g packet Discontinued by provider   tiZANidine (ZANAFLEX) 2 MG tablet Patient Preference      Radiology:  CXR 02/07/2020: Lung volumes are low. No consolidative airspace disease. No pleural effusions. No pneumothorax. No pulmonary nodule or mass noted. Right-sided aortic arch (normal anatomical variant) incidentally noted. Pulmonary vasculature and the cardiomediastinal silhouette are otherwise within normal limits. Aortic atherosclerosis.    Cardiac Studies:   Coronary Angiography H/O Mild MI-Heart cath/stent in 2007 [facility]), Mid RCA 3.5x20 mm Taxus stent (DES).  Lexiscan/modified Bruce Tetrofosmin stress test 02/14/2020: Lexiscan/modified Bruce nuclear stress test performed using 1-day protocol. Stress EKG is non-diagnostic, as this is pharmacological stress test. In addition, stress EKG at 110% MPHR showed sinus tachycardia, old inferior infarct. Mild decrease in reduced radiotracer update in inferior myocardium at rest and stress. In setting of normal myocardial thickening, this likely represents tissue attenuation artifact. Stress LVEF 77%. Low risk study.  Echocardiogram 02/07/2020:  Normal LV systolic function with EF 59%. Left ventricle cavity is normal  in size. Mild concentric hypertrophy of the left ventricle. Normal global  wall motion. Doppler evidence of grade I (impaired) diastolic dysfunction,  normal LAP. Calculated EF 59%.  Left atrial cavity is moderately dilated.  Structurally normal mitral valve.  Mild (Grade I) mitral  regurgitation.  Pericardium is normal. Insignificant pericardial effusion with clear  fluids.  IVC is dilated with respiratory variation. This may suggest elevated right  heart pressure  No significant change from 05/09/2015.   EKG:  EKG 10/01/2022: Sinus bradycardia at rate of 56 bpm, cannot exclude inferior infarct old.  Anteroseptal infarct old.  No evidence of ischemia.  Compared to 09/15/2021, no change.  Assessment     ICD-10-CM   1. Coronary artery disease involving native coronary artery of native heart without angina pectoris  I25.10 EKG 12-Lead    2. Essential hypertension  I10     3. Hypercholesteremia  E78.00      No orders of the defined types were placed in this encounter.   Medications Discontinued During This Encounter  Medication Reason   docusate sodium (COLACE) 100 MG capsule Patient Preference   ondansetron (ZOFRAN) 4 MG tablet Patient Preference   oxyCODONE-acetaminophen (PERCOCET) 5-325 MG tablet Patient Preference   polyethylene glycol (MIRALAX / GLYCOLAX) 17 g packet Discontinued by provider   tiZANidine (ZANAFLEX) 2 MG tablet Patient Preference    Orders Placed This Encounter  Procedures   EKG 12-Lead    Recommendations:   Scott [name]  is a 87 y.o. with hypertension, hyperlipidemia, obstructive sleep apnea on CPAP and is compliant, coronary artery disease and h/o PTCA Mid RCA 3.5x20 mm Taxus stent (DES) in 2007 when he presented with AMI while visiting [place].  1. Coronary artery disease involving native coronary artery of native heart without angina pectoris Patient has remained stable without recurrence of angina pectoris, no change in the EKG, he is presently doing well and continues to remain active.  He is lost his wife about 6 months ago from cancer but he has not been taking care of his sister-in-law who has pulm hypertension and heart failure.  He continues to remain active.  2. Essential hypertension Pressure is under excellent control.  He is on  appropriate medical therapy.  He is on eplerenone with excellent control of hypertension and also for renal protection, renal function has remained stable with stage IIIa chronic kidney disease.  3. Hypercholesteremia I reviewed external labs,  lipids under excellent control.   Encouraged him to continue to remain active.  I will see him back on annual basis.    Adrian Prows, MD, [facility] 10/01/2022, 10:33 AM Office: [phone]

## 2022-12-04 ENCOUNTER — Other Ambulatory Visit: Payer: Self-pay | Admitting: Pulmonary Disease

## 2023-01-30 ENCOUNTER — Other Ambulatory Visit: Payer: Self-pay | Admitting: Cardiology

## 2023-01-30 DIAGNOSIS — I482 Chronic atrial fibrillation, unspecified: Secondary | ICD-10-CM

## 2023-02-09 ENCOUNTER — Other Ambulatory Visit: Payer: Self-pay | Admitting: Cardiology

## 2023-02-09 DIAGNOSIS — E7800 Pure hypercholesterolemia, unspecified: Secondary | ICD-10-CM

## 2023-04-27 ENCOUNTER — Ambulatory Visit (INDEPENDENT_AMBULATORY_CARE_PROVIDER_SITE_OTHER): Payer: Medicare Other | Admitting: Internal Medicine

## 2023-04-27 ENCOUNTER — Encounter: Payer: Self-pay | Admitting: Internal Medicine

## 2023-04-27 VITALS — BP 120/60 | HR 63 | Ht 64.0 in | Wt 172.0 lb

## 2023-04-27 DIAGNOSIS — J4540 Moderate persistent asthma, uncomplicated: Secondary | ICD-10-CM | POA: Diagnosis not present

## 2023-04-27 MED ORDER — ALBUTEROL SULFATE HFA 108 (90 BASE) MCG/ACT IN AERS
2.0000 | INHALATION_SPRAY | Freq: Four times a day (QID) | RESPIRATORY_TRACT | 2 refills | Status: AC | PRN
Start: 2023-04-27 — End: ?

## 2023-04-27 NOTE — Progress Notes (Signed)
OV 04/11/2020  Subjective:  Patient ID: Scott [name], male , DOB: November 29, 1935 , age 87 y.o. , MRN: [id number] , ADDRESS: [address] [place] [address]  PCP   Pearson Grippe, MD  04/11/2020 -   Chief Complaint  Patient presents with   Consult    DOE     HPI Scott [name] 87 y.o. -referred by for shortness of breath on exertion reli relieved by rest.  He is here at the request of his wife.  Patient is well-known to me.  I I take care of his wife.  He tells me for the last several years he has had insidious onset of shortness of breath with exertion.  Gradually and steadily getting worse.  Dyspnea is rated as a 5 out of 10.  He has associated cough that is rated as a 3 out of 10 and wheezing that is rated as 1 out of 10.  He is associated heavy seasonal allergies that is actually perennial now.  He says when he stops his antihistamines his symptoms come back pretty rapidly.  Nevertheless shortness of breath is experienced when he does grocery shopping or climbs 2 flights of stairs.  He does not have symptoms of one flight of stairs.  When he does grocery shopping his knees bother him more than shortness of breath but when he climbs stairs 2 flights shortness of breath bothers him more than the knees.  There is no associated chest pain.  He had a cardiac stress test this year and it is normal and documented below.  His last CT scan of the chest was in 2017 where he had some pleural effusions.  Of note his shortness of breath is progressive but his cough and wheezing is static  Lab review shows mild anemia in 2017 but no further labs with [place]  He also has mild chronic kidney disease last checked within our health system in 2017.  In 2017 CT chest did show small pericardial effusion  His most recent echocardiogram in June 2021 shows grade 1 diastolic dysfunction.  He has associated visceral obesity.  There is no orthopnea.  He has some mild chronic venous stasis edema particularly on the left side.   There is no change to this.  There is no hemoptysis.   He has never had allergy tests  Simple office walk 185 feet x  3 laps goal with forehead probe 04/11/2020   O2 used ra  Number laps completed 3  Comments about pace avg  Resting Pulse Ox/HR 98% and 84/min  Final Pulse Ox/HR 98% and 82/min  Desaturated </= 88% no  Desaturated <= 3% points no  Got Tachycardic >/= 90/min no  Symptoms at end of test No dyspnea  Miscellaneous comments x      CT chest June 2017  IMPRESSION: Small bilateral pleural effusions and mild atelectasis/scarring over the lingula and left lower lobe.   Small pericardial effusion.   Three vessel atherosclerotic coronary artery disease. Aortic atherosclerosis.   Right-sided aortic arch with aberrant left subclavian artery.     Electronically Signed   By: Elberta Fortis M.D.   On: 01/30/2016 15:56   ROS Echocardiogram February 07, 2020  Echocardiogram 02/07/2020:  Normal LV systolic function with EF 59%. Left ventricle cavity is normal  in size. Mild concentric hypertrophy of the left ventricle. Normal global  wall motion. Doppler evidence of grade I (impaired) diastolic dysfunction,  normal LAP. Calculated EF 59%.  Left atrial  OV 04/11/2020  Subjective:  Patient ID: Scott [name], male , DOB: November 29, 1935 , age 87 y.o. , MRN: [id number] , ADDRESS: [address] [place] [address]  PCP   Pearson Grippe, MD  04/11/2020 -   Chief Complaint  Patient presents with   Consult    DOE     HPI Scott [name] 87 y.o. -referred by for shortness of breath on exertion reli relieved by rest.  He is here at the request of his wife.  Patient is well-known to me.  I I take care of his wife.  He tells me for the last several years he has had insidious onset of shortness of breath with exertion.  Gradually and steadily getting worse.  Dyspnea is rated as a 5 out of 10.  He has associated cough that is rated as a 3 out of 10 and wheezing that is rated as 1 out of 10.  He is associated heavy seasonal allergies that is actually perennial now.  He says when he stops his antihistamines his symptoms come back pretty rapidly.  Nevertheless shortness of breath is experienced when he does grocery shopping or climbs 2 flights of stairs.  He does not have symptoms of one flight of stairs.  When he does grocery shopping his knees bother him more than shortness of breath but when he climbs stairs 2 flights shortness of breath bothers him more than the knees.  There is no associated chest pain.  He had a cardiac stress test this year and it is normal and documented below.  His last CT scan of the chest was in 2017 where he had some pleural effusions.  Of note his shortness of breath is progressive but his cough and wheezing is static  Lab review shows mild anemia in 2017 but no further labs with [place]  He also has mild chronic kidney disease last checked within our health system in 2017.  In 2017 CT chest did show small pericardial effusion  His most recent echocardiogram in June 2021 shows grade 1 diastolic dysfunction.  He has associated visceral obesity.  There is no orthopnea.  He has some mild chronic venous stasis edema particularly on the left side.   There is no change to this.  There is no hemoptysis.   He has never had allergy tests  Simple office walk 185 feet x  3 laps goal with forehead probe 04/11/2020   O2 used ra  Number laps completed 3  Comments about pace avg  Resting Pulse Ox/HR 98% and 84/min  Final Pulse Ox/HR 98% and 82/min  Desaturated </= 88% no  Desaturated <= 3% points no  Got Tachycardic >/= 90/min no  Symptoms at end of test No dyspnea  Miscellaneous comments x      CT chest June 2017  IMPRESSION: Small bilateral pleural effusions and mild atelectasis/scarring over the lingula and left lower lobe.   Small pericardial effusion.   Three vessel atherosclerotic coronary artery disease. Aortic atherosclerosis.   Right-sided aortic arch with aberrant left subclavian artery.     Electronically Signed   By: Elberta Fortis M.D.   On: 01/30/2016 15:56   ROS Echocardiogram February 07, 2020  Echocardiogram 02/07/2020:  Normal LV systolic function with EF 59%. Left ventricle cavity is normal  in size. Mild concentric hypertrophy of the left ventricle. Normal global  wall motion. Doppler evidence of grade I (impaired) diastolic dysfunction,  normal LAP. Calculated EF 59%.  Left atrial  cavity is moderately dilated.  Structurally normal mitral valve.  Mild (Grade I) mitral regurgitation.  Pericardium is normal. Insignificant pericardial effusion with clear  fluids.  IVC is dilated with respiratory variation. This may suggest elevated right  heart pressure  No significant change from 05/09/2015.  Lexiscan/modified Bruce Tetrofosmin stress test 02/14/2020: Lexiscan/modified Bruce nuclear stress test performed using 1-day protocol. Stress EKG is non-diagnostic, as this is pharmacological stress test. In addition, stress EKG at 110% MPHR showed sinus tachycardia, old inferior infarct. Mild decrease in reduced radiotracer update in inferior myocardium at rest and stress. In setting of normal myocardial  thickening, this likely represents tissue attenuation artifact. Stress LVEF 77%. Low risk study.   OV 05/15/2020  Subjective:  Patient ID: Scott [name], male , DOB: [birth year] , age 61 y.o. , MRN: [id number] , ADDRESS: [address] [place] [address] PCP Pearson Grippe, MD Relevant Other Providers: - Dr Greig Castilla ards This Provider for this visit: Treatment Team:  Attending Provider: Kalman Shan, MD    05/15/2020 -   Chief Complaint  Patient presents with   Follow-up    CT scan 9/13, [facility] wants to talk about results of both, breathing the same as last visit shortness of breath with exertion     HPI Amartya Aslinger Hamidi 87 y.o. -here to discuss test results.  Pulmonary function test essentially normal..  CT scan shows cirrhosis with trace pleural effusion and some air trapping but otherwise normal.  He is not aware of the fact that he has cirrhosis.  This was followed up with a right upper quadrant ultrasound that did not show cirrhosis but does asymptomatic gallstones he denies any drinking.  There is no interstitial lung disease or pneumonia or cancer.  However he still has symptoms.  At this point in time his symptoms are minimal.  He states that his main priority is to make sure there is nothing acutely abnormal with his lungs explain his symptoms.  He is willing to try an empiric inhaler    CLINICAL DATA:  Increased shortness of breath on exertion, wheezing and chronic cough.   EXAM: CT CHEST WITHOUT CONTRAST   TECHNIQUE: Multidetector CT imaging of the chest was performed following the standard protocol without intravenous contrast. High resolution imaging of the lungs, as well as inspiratory and expiratory imaging, was performed.   COMPARISON:  01/30/2016.   FINDINGS: Cardiovascular: Right-sided aortic arch. Atherosclerotic calcification of the aorta, aortic valve and coronary arteries. Heart is enlarged. Small amount of pericardial fluid is unchanged.   Mediastinum/Nodes:  No pathologically enlarged mediastinal or axillary lymph nodes. Hilar regions are difficult to definitively evaluate without IV contrast. There are calcified right hilar lymph nodes. Esophagus is grossly unremarkable.   Lungs/Pleura: Image quality is degraded by respiratory motion. Negative for subpleural reticulation, traction bronchiectasis/bronchiolectasis, ground-glass, architectural distortion or honeycombing. Trace right pleural fluid. Airway is unremarkable. There is air trapping.   Upper Abdomen: Liver margin is irregular. Stones layer in the gallbladder. Adrenal glands are unremarkable. Low-attenuation lesions in the kidneys measure up to 3.2 cm on the right, incompletely imaged. Visualized portions of the spleen, pancreas, stomach and bowel are unremarkable with the exception of a small hiatal hernia.   Musculoskeletal: Degenerative changes in the spine and shoulders. No worrisome lytic or sclerotic lesions. Old rib fractures.   IMPRESSION: 1. Negative for interstitial lung disease. Air trapping is indicative of small airways disease. 2. Trace right pleural effusion. 3. Cirrhosis. 4. Cholelithiasis. 5. Aortic atherosclerosis (ICD10-I70.0). Coronary  cavity is moderately dilated.  Structurally normal mitral valve.  Mild (Grade I) mitral regurgitation.  Pericardium is normal. Insignificant pericardial effusion with clear  fluids.  IVC is dilated with respiratory variation. This may suggest elevated right  heart pressure  No significant change from 05/09/2015.  Lexiscan/modified Bruce Tetrofosmin stress test 02/14/2020: Lexiscan/modified Bruce nuclear stress test performed using 1-day protocol. Stress EKG is non-diagnostic, as this is pharmacological stress test. In addition, stress EKG at 110% MPHR showed sinus tachycardia, old inferior infarct. Mild decrease in reduced radiotracer update in inferior myocardium at rest and stress. In setting of normal myocardial  thickening, this likely represents tissue attenuation artifact. Stress LVEF 77%. Low risk study.   OV 05/15/2020  Subjective:  Patient ID: Scott [name], male , DOB: [birth year] , age 61 y.o. , MRN: [id number] , ADDRESS: [address] [place] [address] PCP Pearson Grippe, MD Relevant Other Providers: - Dr Greig Castilla ards This Provider for this visit: Treatment Team:  Attending Provider: Kalman Shan, MD    05/15/2020 -   Chief Complaint  Patient presents with   Follow-up    CT scan 9/13, [facility] wants to talk about results of both, breathing the same as last visit shortness of breath with exertion     HPI Amartya Aslinger Hamidi 87 y.o. -here to discuss test results.  Pulmonary function test essentially normal..  CT scan shows cirrhosis with trace pleural effusion and some air trapping but otherwise normal.  He is not aware of the fact that he has cirrhosis.  This was followed up with a right upper quadrant ultrasound that did not show cirrhosis but does asymptomatic gallstones he denies any drinking.  There is no interstitial lung disease or pneumonia or cancer.  However he still has symptoms.  At this point in time his symptoms are minimal.  He states that his main priority is to make sure there is nothing acutely abnormal with his lungs explain his symptoms.  He is willing to try an empiric inhaler    CLINICAL DATA:  Increased shortness of breath on exertion, wheezing and chronic cough.   EXAM: CT CHEST WITHOUT CONTRAST   TECHNIQUE: Multidetector CT imaging of the chest was performed following the standard protocol without intravenous contrast. High resolution imaging of the lungs, as well as inspiratory and expiratory imaging, was performed.   COMPARISON:  01/30/2016.   FINDINGS: Cardiovascular: Right-sided aortic arch. Atherosclerotic calcification of the aorta, aortic valve and coronary arteries. Heart is enlarged. Small amount of pericardial fluid is unchanged.   Mediastinum/Nodes:  No pathologically enlarged mediastinal or axillary lymph nodes. Hilar regions are difficult to definitively evaluate without IV contrast. There are calcified right hilar lymph nodes. Esophagus is grossly unremarkable.   Lungs/Pleura: Image quality is degraded by respiratory motion. Negative for subpleural reticulation, traction bronchiectasis/bronchiolectasis, ground-glass, architectural distortion or honeycombing. Trace right pleural fluid. Airway is unremarkable. There is air trapping.   Upper Abdomen: Liver margin is irregular. Stones layer in the gallbladder. Adrenal glands are unremarkable. Low-attenuation lesions in the kidneys measure up to 3.2 cm on the right, incompletely imaged. Visualized portions of the spleen, pancreas, stomach and bowel are unremarkable with the exception of a small hiatal hernia.   Musculoskeletal: Degenerative changes in the spine and shoulders. No worrisome lytic or sclerotic lesions. Old rib fractures.   IMPRESSION: 1. Negative for interstitial lung disease. Air trapping is indicative of small airways disease. 2. Trace right pleural effusion. 3. Cirrhosis. 4. Cholelithiasis. 5. Aortic atherosclerosis (ICD10-I70.0). Coronary  cavity is moderately dilated.  Structurally normal mitral valve.  Mild (Grade I) mitral regurgitation.  Pericardium is normal. Insignificant pericardial effusion with clear  fluids.  IVC is dilated with respiratory variation. This may suggest elevated right  heart pressure  No significant change from 05/09/2015.  Lexiscan/modified Bruce Tetrofosmin stress test 02/14/2020: Lexiscan/modified Bruce nuclear stress test performed using 1-day protocol. Stress EKG is non-diagnostic, as this is pharmacological stress test. In addition, stress EKG at 110% MPHR showed sinus tachycardia, old inferior infarct. Mild decrease in reduced radiotracer update in inferior myocardium at rest and stress. In setting of normal myocardial  thickening, this likely represents tissue attenuation artifact. Stress LVEF 77%. Low risk study.   OV 05/15/2020  Subjective:  Patient ID: Scott [name], male , DOB: [birth year] , age 61 y.o. , MRN: [id number] , ADDRESS: [address] [place] [address] PCP Pearson Grippe, MD Relevant Other Providers: - Dr Greig Castilla ards This Provider for this visit: Treatment Team:  Attending Provider: Kalman Shan, MD    05/15/2020 -   Chief Complaint  Patient presents with   Follow-up    CT scan 9/13, [facility] wants to talk about results of both, breathing the same as last visit shortness of breath with exertion     HPI Amartya Aslinger Hamidi 87 y.o. -here to discuss test results.  Pulmonary function test essentially normal..  CT scan shows cirrhosis with trace pleural effusion and some air trapping but otherwise normal.  He is not aware of the fact that he has cirrhosis.  This was followed up with a right upper quadrant ultrasound that did not show cirrhosis but does asymptomatic gallstones he denies any drinking.  There is no interstitial lung disease or pneumonia or cancer.  However he still has symptoms.  At this point in time his symptoms are minimal.  He states that his main priority is to make sure there is nothing acutely abnormal with his lungs explain his symptoms.  He is willing to try an empiric inhaler    CLINICAL DATA:  Increased shortness of breath on exertion, wheezing and chronic cough.   EXAM: CT CHEST WITHOUT CONTRAST   TECHNIQUE: Multidetector CT imaging of the chest was performed following the standard protocol without intravenous contrast. High resolution imaging of the lungs, as well as inspiratory and expiratory imaging, was performed.   COMPARISON:  01/30/2016.   FINDINGS: Cardiovascular: Right-sided aortic arch. Atherosclerotic calcification of the aorta, aortic valve and coronary arteries. Heart is enlarged. Small amount of pericardial fluid is unchanged.   Mediastinum/Nodes:  No pathologically enlarged mediastinal or axillary lymph nodes. Hilar regions are difficult to definitively evaluate without IV contrast. There are calcified right hilar lymph nodes. Esophagus is grossly unremarkable.   Lungs/Pleura: Image quality is degraded by respiratory motion. Negative for subpleural reticulation, traction bronchiectasis/bronchiolectasis, ground-glass, architectural distortion or honeycombing. Trace right pleural fluid. Airway is unremarkable. There is air trapping.   Upper Abdomen: Liver margin is irregular. Stones layer in the gallbladder. Adrenal glands are unremarkable. Low-attenuation lesions in the kidneys measure up to 3.2 cm on the right, incompletely imaged. Visualized portions of the spleen, pancreas, stomach and bowel are unremarkable with the exception of a small hiatal hernia.   Musculoskeletal: Degenerative changes in the spine and shoulders. No worrisome lytic or sclerotic lesions. Old rib fractures.   IMPRESSION: 1. Negative for interstitial lung disease. Air trapping is indicative of small airways disease. 2. Trace right pleural effusion. 3. Cirrhosis. 4. Cholelithiasis. 5. Aortic atherosclerosis (ICD10-I70.0). Coronary  cavity is moderately dilated.  Structurally normal mitral valve.  Mild (Grade I) mitral regurgitation.  Pericardium is normal. Insignificant pericardial effusion with clear  fluids.  IVC is dilated with respiratory variation. This may suggest elevated right  heart pressure  No significant change from 05/09/2015.  Lexiscan/modified Bruce Tetrofosmin stress test 02/14/2020: Lexiscan/modified Bruce nuclear stress test performed using 1-day protocol. Stress EKG is non-diagnostic, as this is pharmacological stress test. In addition, stress EKG at 110% MPHR showed sinus tachycardia, old inferior infarct. Mild decrease in reduced radiotracer update in inferior myocardium at rest and stress. In setting of normal myocardial  thickening, this likely represents tissue attenuation artifact. Stress LVEF 77%. Low risk study.   OV 05/15/2020  Subjective:  Patient ID: Scott [name], male , DOB: [birth year] , age 61 y.o. , MRN: [id number] , ADDRESS: [address] [place] [address] PCP Pearson Grippe, MD Relevant Other Providers: - Dr Greig Castilla ards This Provider for this visit: Treatment Team:  Attending Provider: Kalman Shan, MD    05/15/2020 -   Chief Complaint  Patient presents with   Follow-up    CT scan 9/13, [facility] wants to talk about results of both, breathing the same as last visit shortness of breath with exertion     HPI Amartya Aslinger Hamidi 87 y.o. -here to discuss test results.  Pulmonary function test essentially normal..  CT scan shows cirrhosis with trace pleural effusion and some air trapping but otherwise normal.  He is not aware of the fact that he has cirrhosis.  This was followed up with a right upper quadrant ultrasound that did not show cirrhosis but does asymptomatic gallstones he denies any drinking.  There is no interstitial lung disease or pneumonia or cancer.  However he still has symptoms.  At this point in time his symptoms are minimal.  He states that his main priority is to make sure there is nothing acutely abnormal with his lungs explain his symptoms.  He is willing to try an empiric inhaler    CLINICAL DATA:  Increased shortness of breath on exertion, wheezing and chronic cough.   EXAM: CT CHEST WITHOUT CONTRAST   TECHNIQUE: Multidetector CT imaging of the chest was performed following the standard protocol without intravenous contrast. High resolution imaging of the lungs, as well as inspiratory and expiratory imaging, was performed.   COMPARISON:  01/30/2016.   FINDINGS: Cardiovascular: Right-sided aortic arch. Atherosclerotic calcification of the aorta, aortic valve and coronary arteries. Heart is enlarged. Small amount of pericardial fluid is unchanged.   Mediastinum/Nodes:  No pathologically enlarged mediastinal or axillary lymph nodes. Hilar regions are difficult to definitively evaluate without IV contrast. There are calcified right hilar lymph nodes. Esophagus is grossly unremarkable.   Lungs/Pleura: Image quality is degraded by respiratory motion. Negative for subpleural reticulation, traction bronchiectasis/bronchiolectasis, ground-glass, architectural distortion or honeycombing. Trace right pleural fluid. Airway is unremarkable. There is air trapping.   Upper Abdomen: Liver margin is irregular. Stones layer in the gallbladder. Adrenal glands are unremarkable. Low-attenuation lesions in the kidneys measure up to 3.2 cm on the right, incompletely imaged. Visualized portions of the spleen, pancreas, stomach and bowel are unremarkable with the exception of a small hiatal hernia.   Musculoskeletal: Degenerative changes in the spine and shoulders. No worrisome lytic or sclerotic lesions. Old rib fractures.   IMPRESSION: 1. Negative for interstitial lung disease. Air trapping is indicative of small airways disease. 2. Trace right pleural effusion. 3. Cirrhosis. 4. Cholelithiasis. 5. Aortic atherosclerosis (ICD10-I70.0). Coronary

## 2023-04-27 NOTE — Patient Instructions (Addendum)
ICD-10-CM   1. Moderate persistent asthma without complication  J45.40     2. History of wheezing  Z87.898     3. Pulmonary air trapping  R09.89      - clinically stable and well controlle - glad uptodate with vaccines - cxr clear in aug 2022   Plan  -continue advair scheduled daily - continue singulair daily - continue GERD control - continue albuterol as needed  - CMA to do refill - flu shot through PCP Irena Reichmann, DO   Follow-up -6 months or sooner if needed

## 2023-08-27 ENCOUNTER — Other Ambulatory Visit (HOSPITAL_COMMUNITY): Payer: Self-pay

## 2023-08-27 ENCOUNTER — Telehealth: Payer: Self-pay

## 2023-08-27 DIAGNOSIS — Z87898 Personal history of other specified conditions: Secondary | ICD-10-CM

## 2023-08-27 DIAGNOSIS — J4540 Moderate persistent asthma, uncomplicated: Secondary | ICD-10-CM

## 2023-08-27 DIAGNOSIS — R0989 Other specified symptoms and signs involving the circulatory and respiratory systems: Secondary | ICD-10-CM

## 2023-08-27 NOTE — Telephone Encounter (Signed)
*  Pulm  Pharmacy Patient Advocate Encounter   Received notification from CoverMyMeds that prior authorization for Advair HFA [id number]/ACT aerosol  is required/requested.   Insurance verification completed.   The patient is insured through [facility] .   Per test claim:  Symbicort, Advair Diskus or Earlie Server is preferred by the insurance.  If suggested medication is appropriate, Please send in a new RX and discontinue this one. If not, please advise as to why it's not appropriate so that we may request a Prior Authorization. Please note, some preferred medications may still require a PA   CMM Key: [id number]

## 2023-08-30 ENCOUNTER — Other Ambulatory Visit (HOSPITAL_COMMUNITY): Payer: Self-pay

## 2023-09-06 MED ORDER — FLUTICASONE FUROATE-VILANTEROL 100-25 MCG/ACT IN AEPB: 1.0000 | INHALATION_SPRAY | Freq: Every day | RESPIRATORY_TRACT | Status: AC

## 2023-09-06 NOTE — Telephone Encounter (Signed)
Breo ordered and sent to pharmacy

## 2023-09-06 NOTE — Addendum Note (Signed)
Addended by: Kalman Shan on: 09/06/2023 08:28 PM   Modules accepted: Orders

## 2023-09-06 NOTE — Telephone Encounter (Signed)
Dr. Marchelle Gearing, please see prior auth per pharmacy team.  Thank you.

## 2023-09-07 NOTE — Telephone Encounter (Signed)
Pt aware Breo sent.

## 2023-09-30 ENCOUNTER — Ambulatory Visit: Payer: Self-pay | Admitting: Cardiology

## 2023-10-01 ENCOUNTER — Telehealth: Payer: Self-pay

## 2023-10-01 ENCOUNTER — Encounter: Payer: Self-pay | Admitting: Cardiology

## 2023-10-01 ENCOUNTER — Ambulatory Visit: Payer: Medicare Other | Attending: Cardiology | Admitting: Cardiology

## 2023-10-01 ENCOUNTER — Other Ambulatory Visit (HOSPITAL_COMMUNITY): Payer: Self-pay

## 2023-10-01 ENCOUNTER — Other Ambulatory Visit: Payer: Self-pay | Admitting: *Deleted

## 2023-10-01 VITALS — BP 130/70 | HR 65 | Resp 16 | Ht 64.0 in | Wt 180.4 lb

## 2023-10-01 DIAGNOSIS — I10 Essential (primary) hypertension: Secondary | ICD-10-CM | POA: Diagnosis not present

## 2023-10-01 DIAGNOSIS — E7800 Pure hypercholesterolemia, unspecified: Secondary | ICD-10-CM

## 2023-10-01 DIAGNOSIS — I2510 Atherosclerotic heart disease of native coronary artery without angina pectoris: Secondary | ICD-10-CM

## 2023-10-01 NOTE — Progress Notes (Signed)
Cardiology Office Note:  .   Date:  10/01/2023  ID:  Scott [name], DOB 14-Aug-1935, MRN [id number] PCP: Irena Reichmann, DO  [facility] Providers Cardiologist:  None   History of Present Illness: .   Scott [name] is a 88 y.o.  hypertension, hyperlipidemia, obstructive sleep apnea on CPAP and is compliant, coronary artery disease and h/o PTCA Mid RCA 3.5x20 mm Taxus stent (DES) in 2007 when he presented with AMI while visiting [place].    Except for chronic dyspnea on exertion, no chest pain.  He denies any leg edema, no PND or orthopnea.  Discussed the use of AI scribe software for clinical note transcription with the patient, who gave verbal consent to proceed.  History of Present Illness   The patient, a widower with a history of coronary artery disease and hypertension, presents with intermittent difficulty swallowing. He suspects a food allergy as the cause, noting that the symptoms seem to occur after eating at certain restaurants. He denies any recent changes in his cardiovascular symptoms, including chest discomfort, shortness of breath, and leg swelling. He reports that his blood pressure and cholesterol levels have been well controlled with his current medication regimen, which includes atorvastatin, Zetia, and aplenadone. He also has a prescription for benazepril, but he only takes it if his blood pressure exceeds 130.      Labs   External Labs:   Care Everywhere Labs 09/07/2023:  A1c 6.2%.  TSH normal at 1.840.  Total cholesterol 97, triglycerides 66, HDL 38, LDL 44.  Hb 13.2/HCT 41.1, platelets 161, normal indicis.  Serum glucose 95 mg, BUN 29, creatinine 1.39, EGFR 50 mL, potassium 4.6, LFTs normal.  Review of Systems  Cardiovascular:  Positive for dyspnea on exertion (stable) and leg swelling (stable). Negative for chest pain.   Physical Exam:   VS:  BP 130/70 (BP Location: Left Arm, Patient Position: Sitting, Cuff Size: Normal)   Pulse 65   Resp 16   Ht 5\' 4"   (1.626 m)   Wt 180 lb 6.4 oz (81.8 kg)   SpO2 95%   BMI 30.97 kg/m    Wt Readings from Last 3 Encounters:  10/01/23 180 lb 6.4 oz (81.8 kg)  04/27/23 172 lb (78 kg)  10/01/22 170 lb 6.4 oz (77.3 kg)    Physical Exam Neck:     Vascular: No carotid bruit or JVD.  Cardiovascular:     Rate and Rhythm: Normal rate and regular rhythm.     Pulses: Intact distal pulses.     Heart sounds: Normal heart sounds. No murmur heard.    No gallop.  Pulmonary:     Effort: Pulmonary effort is normal.     Breath sounds: Normal breath sounds.  Abdominal:     General: Bowel sounds are normal.     Palpations: Abdomen is soft.  Musculoskeletal:     Right lower leg: Edema (2+ Below knee pitting) present.     Left lower leg: Edema (1-2+ Below knee pitting) present.    Studies Reviewed: Marland Kitchen    Coronary Angiography H/O Mild MI-Heart cath/stent in 2007 [facility]), Mid RCA 3.5x20 mm Taxus stent (DES).   Echocardiogram 02/07/2020: Normal LV systolic function with EF 59%. Left ventricle cavity is normal in size. Mild concentric hypertrophy of the left ventricle. Normal global wall motion. Doppler evidence of grade I (impaired) diastolic dysfunction, normal LAP. Calculated EF 59%. Left atrial cavity is moderately dilated. Structurally normal mitral valve.  Mild (Grade I) mitral regurgitation. Pericardium  is normal. Insignificant pericardial effusion with clear fluids. IVC is dilated with respiratory variation. This may suggest elevated right heart pressure No significant change from 05/09/2015.  EKG:    EKG Interpretation Date/Time:  Friday October 01 2023 14:07:04 EST Ventricular Rate:  54 PR Interval:  166 QRS Duration:  92 QT Interval:  428 QTC Calculation: 405 R Axis:   -31  Text Interpretation: G221 2025: Normal sinus rhythm at the rate of 54 bpm, leftward axis, cannot exclude inferior infarct old.  Poor R wave progression, anterolateral infarct old.  Compared to 10/01/2022, no significant change.  Confirmed by Delrae Rend [phone]) on 10/01/2023 2:22:06 PM    EKG 10/01/2022: Sinus bradycardia at rate of 56 bpm, cannot exclude inferior infarct old. Anteroseptal infarct old. No evidence of ischemia.   Medications and allergies    Allergies  Allergen Reactions   Other Other (See Comments)   Shellfish Allergy Nausea And Vomiting   Sulfa Antibiotics Other (See Comments)    Feels fatigued and weak when taking sulfa for long durations      Current Outpatient Medications:    albuterol (VENTOLIN HFA) 108 (90 Base) MCG/ACT inhaler, Inhale 2 puffs into the lungs every 6 (six) hours as needed for wheezing or shortness of breath., Disp: 18 g, Rfl: 2   alfuzosin (UROXATRAL) 10 MG 24 hr tablet, Take 10 mg by mouth at bedtime., Disp: , Rfl:    Ascorbic Acid (VITAMIN C) 1000 MG tablet, Take 1,000 mg by mouth daily with breakfast., Disp: , Rfl:    aspirin EC (ASPIRIN 81) 81 MG tablet, 81 mg., Disp: , Rfl:    atorvastatin (LIPITOR) 40 MG tablet, Take 40 mg by mouth daily with supper., Disp: , Rfl:    cetirizine (ZYRTEC) 10 MG tablet, Take 10 mg by mouth daily with breakfast., Disp: , Rfl:    COVID-19 mRNA bivalent vaccine, Pfizer, injection, Inject into the muscle., Disp: 0.3 mL, Rfl: 0   eplerenone (INSPRA) 25 MG tablet, TAKE 1 TABLET BY MOUTH IN THE  MORNING, Disp: 90 tablet, Rfl: 3   ezetimibe (ZETIA) 10 MG tablet, TAKE 1 TABLET BY MOUTH DAILY  AFTER SUPPER, Disp: 90 tablet, Rfl: 3   finasteride (PROSCAR) 5 MG tablet, Take 5 mg by mouth daily with supper., Disp: , Rfl:    fluticasone (FLONASE) 50 MCG/ACT nasal spray, Place into both nostrils daily., Disp: , Rfl:    fluticasone-salmeterol (ADVAIR HFA) 115-21 MCG/ACT inhaler, Inhale 2 puffs into the lungs 2 (two) times daily., Disp: , Rfl:    gabapentin (NEURONTIN) 300 MG capsule, Take 300 mg by mouth 2 (two) times daily., Disp: , Rfl:    montelukast (SINGULAIR) 10 MG tablet, Take 10 mg by mouth daily with breakfast., Disp: , Rfl:    Multiple  Vitamin (MULTIVITAMIN WITH MINERALS) TABS tablet, Take 1 tablet by mouth at bedtime., Disp: , Rfl:    oxybutynin (DITROPAN) 5 MG tablet, Take 5 mg by mouth 2 (two) times daily., Disp: , Rfl:    pantoprazole sodium (PROTONIX) 40 mg, Take 40 mg by mouth daily before breakfast., Disp: , Rfl:   Current Facility-Administered Medications:    fluticasone furoate-vilanterol (BREO ELLIPTA) 100-25 MCG/ACT 1 puff, 1 puff, Inhalation, Daily,    ASSESSMENT AND PLAN: .      ICD-10-CM   1. Coronary artery disease involving native coronary artery of native heart without angina pectoris  I25.10 EKG 12-Lead    2. Essential hypertension  I10     3. Hypercholesteremia  E78.00  Assessment and Plan    Coronary Artery Disease (CAD) The patient experienced a myocardial infarction in 2006-2007 with subsequent stent placement. EKG and echocardiogram have remained stable since 2021, with no recent chest discomfort or changes in symptoms. A stress test in 2021 showed no new blockages. The condition has been well-managed for the past 8-10 years, with no changes in management since 2021-2022. Continue current management and release to the family doctor for routine follow-up.  Hyperlipidemia Cholesterol levels are well-controlled, with an LDL of 44. The patient is on atorvastatin 40 mg once daily and Zetia 10 mg once daily. Continue both medications as prescribed.  Hypertension Blood pressure is well-controlled with eplerenone 25 mg once daily. Benazepril is used as needed if blood pressure exceeds 130 mmHg. Continue eplerenone and use benazepril as needed.  Dysphagia The patient experiences intermittent difficulty swallowing, possibly related to food allergies, with symptoms of throat closing after eating certain foods, particularly at specific restaurants. The differential diagnosis includes food allergy versus other esophageal disorders. The patient prefers Dr. Adela Lank but is open to any available GI  specialist. Call GI doctors to expedite an appointment and attempt to schedule with Dr. Adela Lank.  General Health Maintenance There are no recent changes in health status, and regular follow-ups with the primary care physician are ongoing. Release to the family doctor for routine follow-up, and the patient should call if any new symptoms or concerns arise.  Follow-up Follow up with a GI specialist as soon as an appointment is scheduled. Call the cardiologist if new symptoms or concerns arise.     I have reviewed his external labs, no recent hospitalizations, as patient has remained stable over the past >5 years, it is appropriate that I see him back on a as needed basis.  Patient is in agreement with the plan.  Signed,  Yates Decamp, MD, [facility] 10/01/2023, 5:09 PM [facility] [address] [place], [place] [address] Phone: [phone]. Fax:  [phone]

## 2023-10-01 NOTE — Telephone Encounter (Signed)
Breo is the moderate and Advair.  So he can finish the course of Advair that he has and then start Breo because it would be cheaper and technically according to [employer] it is superior.

## 2023-10-01 NOTE — Patient Instructions (Signed)
Medication Instructions:  Your physician recommends that you continue on your current medications as directed. Please refer to the Current Medication list given to you today.  *If you need a refill on your cardiac medications before your next appointment, please call your pharmacy*   Lab Work: NONE  If you have labs (blood work) drawn today and your tests are completely normal, you will receive your results only by: MyChart Message (if you have MyChart) OR A paper copy in the mail If you have any lab test that is abnormal or we need to change your treatment, we will call you to review the results.   Testing/Procedures: NONE   Follow-Up:As needed At [facility], you and your health needs are our priority.  As part of our continuing mission to provide you with exceptional heart care, we have created designated Provider Care Teams.  These Care Teams include your primary Cardiologist (physician) and Advanced Practice Providers (APPs -  Physician Assistants and Nurse Practitioners) who all work together to provide you with the care you need, when you need it.    Provider:   Yates Decamp, MD

## 2023-10-01 NOTE — Telephone Encounter (Signed)
I called and spoke with patient, he states he was sent a 3 months supply of the Advair inhaler so he has quite a supply that he is currently using.  According to pharmacy, his inhaler was changed to [employer] coverage.  Please advise if patient is to continue Breo after he finishes supply of Advair.  Thank you.

## 2023-10-01 NOTE — Telephone Encounter (Signed)
 Lm x1 for the patient.

## 2023-10-01 NOTE — Telephone Encounter (Signed)
Pharmacy Patient Advocate Encounter   Received notification from CoverMyMeds that prior authorization for Advair HFA 115-21MCG/ACT aerosol is required/requested.   Insurance verification completed.   The patient is insured through [facility] Medicare Part D .   Per test claim: Patient changed to Scott [name] per encounter on 08-27-2023

## 2023-10-04 NOTE — Telephone Encounter (Signed)
 The preferred dose of Breo is 200. Do you want to still send 100?   Patient aware. Patient would like Breo sent to [facility] even though he still has Advair.He will complete Advair on hand then start Breo.

## 2023-10-06 NOTE — Telephone Encounter (Signed)
 I believe the equivalent of Advair 115 twice a day is Breo 100 twice a day therefore please send the Breo 100 if his symptoms get worse then I can put him on 200

## 2023-10-08 MED ORDER — FLUTICASONE FUROATE-VILANTEROL 200-25 MCG/ACT IN AEPB: 1.0000 | INHALATION_SPRAY | Freq: Every day | RESPIRATORY_TRACT | 5 refills | Status: AC

## 2023-10-08 NOTE — Telephone Encounter (Signed)
 Breo 200 sent to preferred pharmacy.  Pt is aware and voiced his understanding.  Nothing further needed.

## 2023-10-08 NOTE — Telephone Encounter (Signed)
 This is the message received when attempting to order Breo 100:    Breo 100 is NOT on the formulary. The alternatives are Symbicort, Advair Diskus, or Breo 50 or 200. Unsure why Breo 100 is not on formulary, did confirm this with [employer].  Please advise, thank you!

## 2023-10-08 NOTE — Telephone Encounter (Signed)
 Ok do brep 200  please. I am at ICU and do not have time to do the order

## 2023-10-19 ENCOUNTER — Encounter: Payer: Self-pay | Admitting: Nurse Practitioner

## 2023-12-07 ENCOUNTER — Encounter: Payer: Self-pay | Admitting: Nurse Practitioner

## 2023-12-07 ENCOUNTER — Ambulatory Visit (INDEPENDENT_AMBULATORY_CARE_PROVIDER_SITE_OTHER): Admitting: Nurse Practitioner

## 2023-12-07 VITALS — BP 130/62 | HR 52 | Ht 64.0 in | Wt 182.0 lb

## 2023-12-07 DIAGNOSIS — K219 Gastro-esophageal reflux disease without esophagitis: Secondary | ICD-10-CM

## 2023-12-07 DIAGNOSIS — R0989 Other specified symptoms and signs involving the circulatory and respiratory systems: Secondary | ICD-10-CM | POA: Diagnosis not present

## 2023-12-07 DIAGNOSIS — K2270 Barrett's esophagus without dysplasia: Secondary | ICD-10-CM

## 2023-12-07 NOTE — Progress Notes (Signed)
 12/07/2023 JALANI CHILLIS [id number] [birth year]   CHIEF COMPLAINT: Throat closing up when eating  HISTORY OF PRESENT ILLNESS: [address] Henri is an 88 year old male with a past medical history of arthritis, hypertension, hyperlipidemia, coronary artery disease s/p MI and PTCA/coronary stent placement 2007, prostate cancer under surveillance, obstructive sleep apnea uses CPAP, GERD, ? Barrett's esophagus 20+ years ago and colon polyps. Previously known by Dr. Sandrea Cruel. He presents today as referred by his cardiologist, Dr. Berry Bristol, for further evaluation regarding swallowing difficulties, referred to Dr. General Kenner.   He presents today for further evaluation regarding episodes of throat closing which occurs sporadically when eating. He denies having any difficulty swallowing solid foods, liquids or pills. When he eats, he is able to swallow food without difficulty but then has throat tightness. He stated his throat gets tight and closes up which results in difficulty breathing but he is able to sip on water  and the throat tightness abates within one to two minutes. Spicy foods or foods with gravy possible trigger these episodes. He questions if he has a food allergy.  He denies having any heartburn or regurgitation.  No upper or lower abdominal pain.  He passes a normal formed brown bowel movement once daily.  He has a history of GERD and remote Barrett's esophagus which he stated was diagnosed 20 to 25 years ago for which he previously took pantoprazole  20 mg daily but his PCP increased Pantoprazole  to 40 mg daily 6 months ago due to throat tightening symptoms as noted above.  He denies having any chest pain.  He has mild chronic DOE.  No weight loss.  He stated he would like to lose weight.    There was apparently a question of Barrett's esophagus on endoscopy in 2003 however biopsies did not reveal Barrett's esophagus. He had subsequent endoscopies performed 2005, 2006 and 2008 and none of those biopsies  revealed Barrett's esophagus.   He underwent a colonoscopy 03/16/2012 which showed internal hemorrhoids otherwise was normal.  He has a history of coronary artery disease with past anterior MI status post PTCA and DES to the mid RCA in 2007. A stress test in 2021 showed no new blockages. His CAD has been well-managed for the past 8-10 years, with no changes in management since 2021-2022. He was last seen by his cardiologist Dr. Luwanna Sam 10/01/2023 and at that time his cardiac status was stable without chest pain but noted having chronic DOE.   Labs completed by his PCP 09/06/2023, patient showed me his results on his phone: WBC 10.3.  Hemoglobin 13.2.  Hematocrit 41.4.  Platelet 161.  BUN 29.  Creatinine 1.39.  GFR 50.  Sodium 141.  Potassium 4.6.  Calcium  8.8.  Total bili 0.8.  Alk phos 93.  AST 22.  ALT 18.  TSH 1.840.  Hemoglobin A1c 6.3%.     Latest Ref Rng & Units 01/11/2022    3:32 AM 01/10/2022    3:48 AM 12/29/2021    1:36 PM  CBC  WBC 4.0 - 10.5 K/uL 13.9  16.3  9.4   Hemoglobin 13.0 - 17.0 g/dL 78.2  95.6  21.3   Hematocrit 39.0 - 52.0 % 37.7  36.5  40.4   Platelets 150 - 400 K/uL 160  159  165        Latest Ref Rng & Units 01/10/2022    3:48 AM 12/29/2021    1:36 PM 04/11/2020   10:02 AM  CMP  Glucose 70 - 99  mg/dL 147  829  562   BUN 8 - 23 mg/dL 30  32  27   Creatinine 0.61 - 1.24 mg/dL 1.30  8.65  7.84   Sodium 135 - 145 mmol/L 137  139  139   Potassium 3.5 - 5.1 mmol/L 4.1  4.5  4.3   Chloride 98 - 111 mmol/L 105  110  107   CO2 22 - 32 mmol/L 25  23  25    Calcium  8.9 - 10.3 mg/dL 8.7  9.0  9.1     Echocardiogram 02/07/2020: Normal LV systolic function with EF 59%. Left ventricle cavity is normal in size. Mild concentric hypertrophy of the left ventricle. Normal global wall motion. Doppler evidence of grade I (impaired) diastolic dysfunction, normal LAP. Calculated EF 59%. Left atrial cavity is moderately dilated. Structurally normal mitral valve.  Mild (Grade I) mitral  regurgitation. Pericardium is normal. Insignificant pericardial effusion with clear fluids. IVC is dilated with respiratory variation. This may suggest elevated right heart pressure No significant change from 05/09/2015.  Past Medical History:  Diagnosis Date   Acid reflux    history of Barretts esophagus    Adenomatous colon polyp 01/2002   Arthritis 2004   Coronary artery disease 2007   cardiac cath w/ angioplasty & stent placement x 1   Enlarged prostate    Hyperlipidemia    Hypertension    Myocardial infarction [facility]) 2007   while vacationing in [place] , cath. done there & x1 stent placed    Neuromuscular disorder (HCC)    carpal tunnel - both hands    Prostate cancer (HCC) 2013   biopsy- active survelliance, annual biopsies    Sepsis secondary to UTI [facility])    Shortness of breath dyspnea    Sleep apnea 2001   Venous insufficiency    Past Surgical History:  Procedure Laterality Date   ANGIOPLASTY  09/18/2005   1 stent placed   APPENDECTOMY  1989   PROSTATE BIOPSY  12/30/2000   ROTATOR CUFF REPAIR  2004   right   TONSILLECTOMY AND ADENOIDECTOMY  1950   TOTAL KNEE ARTHROPLASTY Left 07/10/2016   Procedure: TOTAL KNEE ARTHROPLASTY;  Surgeon: Winston Hawking, MD;  [facility];  Service: Orthopedics;  Laterality: Left;   TOTAL KNEE ARTHROPLASTY Right 01/09/2022   Procedure: TOTAL KNEE ARTHROPLASTY;  Surgeon: Winston Hawking, MD;  Location: [facility] ORS;  Service: Orthopedics;  Laterality: Right;   VASECTOMY  1970   Social History: He is widowed.  Retired.  He has 4 sons.  Non-smoker.  No alcohol use.  No drug use.  Family History: No known family history of esophageal, gastric or colon cancer.  Brother had prostate cancer.  Mother and father with history of heart disease.  Brother, sister and father had diabetes.  Allergies  Allergen Reactions   Other Other (See Comments)   Shellfish Allergy Nausea And Vomiting   Sulfa Antibiotics Other (See Comments)    Feels fatigued and weak when  taking sulfa for long durations       Outpatient Encounter Medications as of 12/07/2023  Medication Sig   albuterol  (VENTOLIN  HFA) 108 (90 Base) MCG/ACT inhaler Inhale 2 puffs into the lungs every 6 (six) hours as needed for wheezing or shortness of breath.   alfuzosin  (UROXATRAL ) 10 MG 24 hr tablet Take 10 mg by mouth at bedtime.   Ascorbic Acid  (VITAMIN C ) 1000 MG tablet Take 1,000 mg by mouth daily with breakfast.   aspirin  EC (ASPIRIN  81) 81 MG tablet  81 mg.   atorvastatin  (LIPITOR) 40 MG tablet Take 40 mg by mouth daily with supper.   cetirizine (ZYRTEC) 10 MG tablet Take 10 mg by mouth daily with breakfast.   eplerenone  (INSPRA ) 25 MG tablet TAKE 1 TABLET BY MOUTH IN THE  MORNING   ezetimibe  (ZETIA ) 10 MG tablet TAKE 1 TABLET BY MOUTH DAILY  AFTER SUPPER   finasteride  (PROSCAR ) 5 MG tablet Take 5 mg by mouth daily with supper.   fluticasone  (FLONASE ) 50 MCG/ACT nasal spray Place into both nostrils daily.   fluticasone  furoate-vilanterol (BREO ELLIPTA ) 200-25 MCG/ACT AEPB Inhale 1 puff into the lungs daily.   fluticasone -salmeterol (ADVAIR  HFA) 115-21 MCG/ACT inhaler Inhale 2 puffs into the lungs 2 (two) times daily.   gabapentin  (NEURONTIN ) 300 MG capsule Take 300 mg by mouth 2 (two) times daily.   montelukast  (SINGULAIR ) 10 MG tablet Take 10 mg by mouth daily with breakfast.   Multiple Vitamin (MULTIVITAMIN WITH MINERALS) TABS tablet Take 1 tablet by mouth at bedtime.   oxybutynin  (DITROPAN ) 5 MG tablet Take 5 mg by mouth 2 (two) times daily.   pantoprazole  sodium (PROTONIX ) 40 mg Take 40 mg by mouth daily before breakfast.   Facility-Administered Encounter Medications as of 12/07/2023  Medication   fluticasone  furoate-vilanterol (BREO ELLIPTA ) 100-25 MCG/ACT 1 puff   REVIEW OF SYSTEMS:  Gen: Denies fever, sweats or chills. No weight loss.  CV: Denies chest pain, palpitations or edema. Resp: Denies cough, shortness of breath of hemoptysis.  GI: Denies heartburn, dysphagia,  stomach or lower abdominal pain. No diarrhea or constipation.  GU: Denies urinary burning, blood in urine, increased urinary frequency or incontinence. MS: Denies joint pain, muscles aches or weakness. Derm: Denies rash, itchiness, skin lesions or unhealing ulcers. Psych: Denies depression, anxiety, memory loss or confusion. Heme: Denies bruising, easy bleeding. Neuro:  Denies headaches, dizziness or paresthesias. Endo:  Denies any problems with DM, thyroid or adrenal function.  PHYSICAL EXAM:  BP 130/62   Pulse (!) 52   Ht 5\' 4"  (1.626 m)   Wt 182 lb (82.6 kg)   BMI 31.24 kg/m   General: 88 year old male in no acute distress. Head: Normocephalic and atraumatic. Eyes:  Sclerae non-icteric, conjunctive pink. Ears: Normal auditory acuity. Mouth: Dentition intact. No ulcers or lesions.  Neck: Supple, no lymphadenopathy or thyromegaly.  Lungs: Breath sound coarse throughout.  Heart: Regular rate and rhythm. No murmur, rub or gallop appreciated.  Abdomen: Soft, nontender, nondistended. No masses. No hepatosplenomegaly. Normoactive bowel sounds x 4 quadrants.  Rectal: Deferred.  Musculoskeletal: Symmetrical with no gross deformities. Skin: Warm and dry. No rash or lesions on visible extremities. Extremities: No edema. Compression stocks in use.  Neurological: Alert oriented x 4, no focal deficits.  Psychological:  Alert and cooperative. Normal mood and affect.  ASSESSMENT AND PLAN:  88 year old male with a history of GERD who presents with episodic throat tightening which occurs while sporadically while eating, sometimes triggered by eating spicy foods or gravy x 6 months. No obvious heartburn. On Pantoprazole  40mg  every day.  -Barium swallow study with tablet  -ENT evaluation to include laryngoscopy  -Continue Pantoprazole  40mg  every day for now -Call 911 if throat tightening occurs with any respiratory difficulty  -I discussed scheduling a future EGD after barium swallow study and  ENT evaluation completed. Will expedite EGD if barium swallow study shows any significant abnormality/stricture  History of CAD, MI s/p PTCA and DES in 2007    CC:  Pete Brand,  DO

## 2023-12-07 NOTE — Progress Notes (Signed)
 Agree with assessment and plan as outlined.

## 2023-12-07 NOTE — Patient Instructions (Addendum)
 You have been scheduled for a Barium Esophogram at [facility] (1st floor of the hospital) on 12/27/23 at 9:00 am,. Please arrive 30-40 minutes prior to your appointment for registration. If you need to reschedule for any reason, please contact radiology at [phone] to do so. __________________________________________________________________________  We have sent over your referral to Otolaryngology. You may receive a call within the next few weeks regarding an appointment.  Continue Pantoprazole  40 mg daily  Due to recent changes in healthcare laws, you may see the results of your imaging and laboratory studies on MyChart before your provider has had a chance to review them.  We understand that in some cases there may be results that are confusing or concerning to you. Not all laboratory results come back in the same time frame and the provider may be waiting for multiple results in order to interpret others.  Please give us  48 hours in order for your provider to thoroughly review all the results before contacting the office for clarification of your results.   Thank you for trusting me with your gastrointestinal care!   Everett Hitt, CRNP

## 2023-12-16 ENCOUNTER — Encounter (INDEPENDENT_AMBULATORY_CARE_PROVIDER_SITE_OTHER): Payer: Self-pay | Admitting: Otolaryngology

## 2023-12-27 ENCOUNTER — Ambulatory Visit: Payer: Self-pay | Admitting: Nurse Practitioner

## 2023-12-27 ENCOUNTER — Ambulatory Visit (HOSPITAL_COMMUNITY)
Admission: RE | Admit: 2023-12-27 | Discharge: 2023-12-27 | Disposition: A | Discharge status: Home / Self Care | Source: Ambulatory Visit | Attending: Nurse Practitioner | Admitting: Nurse Practitioner

## 2023-12-27 DIAGNOSIS — R0989 Other specified symptoms and signs involving the circulatory and respiratory systems: Secondary | ICD-10-CM | POA: Insufficient documentation

## 2023-12-27 DIAGNOSIS — K2270 Barrett's esophagus without dysplasia: Secondary | ICD-10-CM | POA: Diagnosis present

## 2024-01-17 ENCOUNTER — Other Ambulatory Visit (HOSPITAL_COMMUNITY): Payer: Self-pay | Admitting: Otolaryngology

## 2024-01-17 ENCOUNTER — Ambulatory Visit (INDEPENDENT_AMBULATORY_CARE_PROVIDER_SITE_OTHER): Admitting: Otolaryngology

## 2024-01-17 ENCOUNTER — Encounter (INDEPENDENT_AMBULATORY_CARE_PROVIDER_SITE_OTHER): Payer: Self-pay | Admitting: Otolaryngology

## 2024-01-17 ENCOUNTER — Telehealth (HOSPITAL_COMMUNITY): Payer: Self-pay | Admitting: *Deleted

## 2024-01-17 VITALS — BP 147/71 | HR 67

## 2024-01-17 DIAGNOSIS — R1310 Dysphagia, unspecified: Secondary | ICD-10-CM

## 2024-01-17 DIAGNOSIS — R0989 Other specified symptoms and signs involving the circulatory and respiratory systems: Secondary | ICD-10-CM | POA: Diagnosis not present

## 2024-01-17 DIAGNOSIS — T17308A Unspecified foreign body in larynx causing other injury, initial encounter: Secondary | ICD-10-CM

## 2024-01-17 DIAGNOSIS — R059 Cough, unspecified: Secondary | ICD-10-CM

## 2024-01-17 DIAGNOSIS — K219 Gastro-esophageal reflux disease without esophagitis: Secondary | ICD-10-CM | POA: Diagnosis not present

## 2024-01-17 NOTE — Progress Notes (Signed)
 ENT CONSULT:  Reason for Consult: throat tightness    History of Present Illness  Discussed the use of AI scribe software for clinical note transcription with the patient, who gave verbal consent to proceed.  History of Present Illness    Discussed the use of AI scribe software for clinical note transcription with the patient, who gave verbal consent to proceed.  History of Present Illness   Scott [name] "Scott [name]" is an 88 year old male with hx of asthma and Barrett's/GERD, who presents with episodes of throat closing and shortness of breath while eating accompanied by choking.   He experiences episodes where his throat feels like it is closing, accompanied by shortness of breath, primarily during lunch or supper. These episodes are sometimes associated with difficulty swallowing. He manages these episodes by waiting and drinking water . No issues with liquids going down and no episodes of coughing or choking on solids or liquids.  He has a history of asthma, diagnosed following pulmonary function testing, and is under the care of Dr. Bertrum Brodie for his pulmonary issues. There is no acute exacerbation noted during the visit.  He has a history of heartburn symptoms, which have improved with pantoprazole . He does not usually experience heartburn symptoms currently. He also has a history of Barrett syndrome, diagnosed approximately 15 to 20 years ago. He underwent a barium swallow test and was informed of esophageal dysfunction. He has not had an upper endoscopy recently and is scheduled to see Dr. General Kenner, a gastroenterologist, for further evaluation.  He has a past medical history of a heart attack in 2007, for which he received a stent. He reports no further cardiac issues since then.         Records Reviewed:  GI visit with NP Kennedy-Smith HISTORY OF PRESENT ILLNESS: Scott [name]. Hedden is an 88 year old male with a past medical history of arthritis, hypertension, hyperlipidemia, coronary  artery disease s/p MI and PTCA/coronary stent placement 2007, prostate cancer under surveillance, obstructive sleep apnea uses CPAP, GERD, ? Barrett's esophagus 20+ years ago and colon polyps. Previously known by Dr. Sandrea Cruel. He presents today as referred by his cardiologist, Dr. Berry Bristol, for further evaluation regarding swallowing difficulties, referred to Dr. General Kenner.    He presents today for further evaluation regarding episodes of throat closing which occurs sporadically when eating. He denies having any difficulty swallowing solid foods, liquids or pills. When he eats, he is able to swallow food without difficulty but then has throat tightness. He stated his throat gets tight and closes up which results in difficulty breathing but he is able to sip on water  and the throat tightness abates within one to two minutes. Spicy foods or foods with gravy possible trigger these episodes. He questions if he has a food allergy.  He denies having any heartburn or regurgitation.  No upper or lower abdominal pain.  He passes a normal formed brown bowel movement once daily.  He has a history of GERD and remote Barrett's esophagus which he stated was diagnosed 20 to 25 years ago for which he previously took pantoprazole  20 mg daily but his PCP increased Pantoprazole  to 40 mg daily 6 months ago due to throat tightening symptoms as noted above.  He denies having any chest pain.  He has mild chronic DOE.  No weight loss.  He stated he would like to lose weight.        Past Medical History:  Diagnosis Date   Acid reflux    history of Barretts  esophagus    Adenomatous colon polyp 01/2002   Arthritis 2004   Coronary artery disease 2007   cardiac cath w/ angioplasty & stent placement x 1   Enlarged prostate    Hyperlipidemia    Hypertension    Myocardial infarction [facility]) 2007   while vacationing in [place] , cath. done there & x1 stent placed    Neuromuscular disorder (HCC)    carpal tunnel - both hands    Prostate  cancer (HCC) 2013   biopsy- active survelliance, annual biopsies    Sepsis secondary to UTI [facility])    Shortness of breath dyspnea    Sleep apnea 2001   Venous insufficiency     Past Surgical History:  Procedure Laterality Date   ANGIOPLASTY  09/18/2005   1 stent placed   APPENDECTOMY  1989   PROSTATE BIOPSY  12/30/2000   ROTATOR CUFF REPAIR  2004   right   TONSILLECTOMY AND ADENOIDECTOMY  1950   TOTAL KNEE ARTHROPLASTY Left 07/10/2016   Procedure: TOTAL KNEE ARTHROPLASTY;  Surgeon: Winston Hawking, MD;  Location: [facility] OR;  Service: Orthopedics;  Laterality: Left;   TOTAL KNEE ARTHROPLASTY Right 01/09/2022   Procedure: TOTAL KNEE ARTHROPLASTY;  Surgeon: Winston Hawking, MD;  Location: [facility] ORS;  Service: Orthopedics;  Laterality: Right;   VASECTOMY  1970    Family History  Problem Relation Age of Onset   Heart disease Mother    Diabetes Father    Heart disease Father    Diabetes Sister    Prostate cancer Brother    Diabetes Brother    Diabetes Son    Liver disease Neg Hx    Esophageal cancer Neg Hx    Colon cancer Neg Hx     Social History:  reports that he has never smoked. He has never used smokeless tobacco. He reports that he does not drink alcohol and does not use drugs.  Allergies:  Allergies  Allergen Reactions   Other Other (See Comments)   Shellfish Allergy Nausea And Vomiting   Sulfa Antibiotics Other (See Comments)    Feels fatigued and weak when taking sulfa for long durations    Misc. Sulfonamide Containing Compounds Other (See Comments)    Medications: I have reviewed the patient's current medications.  The PMH, PSH, Medications, Allergies, and SH were reviewed and updated.  ROS: Constitutional: Negative for fever, weight loss and weight gain. Cardiovascular: Negative for chest pain and dyspnea on exertion. Respiratory: Is not experiencing shortness of breath at rest. Gastrointestinal: Negative for nausea and vomiting. Neurological: Negative for  headaches. Psychiatric: The patient is not nervous/anxious  Blood pressure (!) 147/71, pulse 67, SpO2 92%. There is no height or weight on file to calculate BMI.  PHYSICAL EXAM:  Exam: General: Well-developed, well-nourished Communication and Voice: Clear pitch and clarity Respiratory Respiratory effort: Equal inspiration and expiration without stridor Cardiovascular Peripheral Vascular: Warm extremities with equal color/perfusion Eyes: No nystagmus with equal extraocular motion bilaterally Neuro/Psych/Balance: Patient oriented to person, place, and time; Appropriate mood and affect; Gait is intact with no imbalance; Cranial nerves I-XII are intact Head and Face Inspection: Normocephalic and atraumatic without mass or lesion Palpation: Facial skeleton intact without bony stepoffs Salivary Glands: No mass or tenderness Facial Strength: Facial motility symmetric and full bilaterally ENT Pinna: External ear intact and fully developed External canal: Canal is patent with intact skin Tympanic Membrane: Clear and mobile External Nose: No scar or anatomic deformity Internal Nose: Septum is relatively straight. No polyp, or purulence. Mucosal edema  and erythema present.  Bilateral inferior turbinate hypertrophy.  Lips, Teeth, and gums: Mucosa and teeth intact and viable TMJ: No pain to palpation with full mobility Oral cavity/oropharynx: No erythema or exudate, no lesions present Nasopharynx: No mass or lesion with intact mucosa Hypopharynx: Intact mucosa without pooling of secretions Larynx Glottic: Full true vocal cord mobility without lesion or mass Supraglottic: Normal appearing epiglottis and AE folds Interarytenoid Space: Moderate pachydermia&edema Subglottic Space: Patent without lesion or edema Neck Neck and Trachea: Midline trachea without mass or lesion Thyroid: No mass or nodularity Lymphatics: No lymphadenopathy  Procedure: Preoperative diagnosis: dysphagia  choking  Postoperative diagnosis:   Same + GERD LPR  Procedure: Flexible fiberoptic laryngoscopy  Surgeon: Audric Venn, MD  Anesthesia: Topical lidocaine and Afrin Complications: None Condition is stable throughout exam  Indications and consent:  The patient presents to the clinic with above symptoms. Indirect laryngoscopy view was incomplete. Thus it was recommended that they undergo a flexible fiberoptic laryngoscopy. All of the risks, benefits, and potential complications were reviewed with the patient preoperatively and verbal informed consent was obtained.  Procedure: The patient was seated upright in the clinic. Topical lidocaine and Afrin were applied to the nasal cavity. After adequate anesthesia had occurred, I then proceeded to pass the flexible telescope into the nasal cavity. The nasal cavity was patent without rhinorrhea or polyp. The nasopharynx was also patent without mass or lesion. The base of tongue was visualized and was normal. There were no signs of pooling of secretions in the piriform sinuses. The true vocal folds were mobile bilaterally. There were no signs of glottic or supraglottic mucosal lesion or mass. There was was interarytenoid pachydermia and post cricoid edema. The telescope was then slowly withdrawn and the patient tolerated the procedure throughout.      Studies Reviewed: Esophagram 12/27/23 1. No evidence of a fixed esophageal stricture. A swallowed 13 mm barium tablet traversed the esophagus and passed to the stomach without significant delay. 2. Tortuous thoracic esophagus. 3. Moderate-to-severe intermittent esophageal dysmotility with tertiary contractions. 4. Small hiatal hernia. 5. Small-volume gastroesophageal reflux observed to the level of the lower esophagus with Valsalva. 6. Right-sided aortic arch with aberrant left subclavian artery noted on the prior chest CT  Assessment/Plan: Encounter Diagnoses  Name Primary?   Chronic GERD     Throat tightness Yes   Dysphagia, unspecified type    Choking, initial encounter     Assessment & Plan   Assessment and Plan    Swallowing difficulties and choking episodes with dyspnea when eating Episodes of choking and shortness of breath during meals Scott be related to swallowing difficulties. Previous barium swallow test indicated esophageal dysmotility (esophagram only). Hx of GERD and Barretts f/b GI. Further evaluation with a modified barium swallow is recommended to assess swallowing mechanics and potential need for swallow therapy. Flexible scope exam without narrowing of the subglottis and normal movement of VF b/l, no lesions or masses. No pooling of secretions to suggest significant swallowing issues.  - Order modified barium swallow test. Will call with results. - Consider swallow therapy if test indicates issues. - see GI as scheduled   Gastroesophageal reflux disease (GERD) GERD is well-controlled with pantoprazole , but symptoms suggest possible reflux-related irritation. Atypical symptoms of reflux, including cough, choking, and sensation of a lump in the throat, were discussed. Lifestyle modifications and additional treatments are recommended to manage symptoms. Has hx of Barretts - Provide after visit summary with information on controlling reflux. - Recommend Reflux Gourmet supplement  to prevent reflux postprandially. - continue PPI  - see GI as scheduled  Asthma Intermittent shortness of breath and choking episodes occur during meals. Current symptoms Scott be related to swallowing difficulties or reflux rather than asthma exacerbation. - see pulm as scheduled       Thank you for allowing me to participate in the care of this patient. Please do not hesitate to contact me with any questions or concerns.   Artice Last, MD Otolaryngology [facility] Phone: [phone] Fax: [phone]    01/17/2024, 8:25 AM

## 2024-01-17 NOTE — Patient Instructions (Signed)

## 2024-01-17 NOTE — Telephone Encounter (Signed)
 Attempted to contact patient at [phone] to schedule swallow study. Left VM and request for call back (AH)

## 2024-01-19 ENCOUNTER — Other Ambulatory Visit: Payer: Self-pay | Admitting: Cardiology

## 2024-01-19 DIAGNOSIS — E7800 Pure hypercholesterolemia, unspecified: Secondary | ICD-10-CM

## 2024-01-19 DIAGNOSIS — I482 Chronic atrial fibrillation, unspecified: Secondary | ICD-10-CM

## 2024-01-19 NOTE — Progress Notes (Signed)
 I called patient, he stated his throat tightness symptoms are well controlled at this time.  He remains on Pantoprazole  40 mg once daily.  I instructed the patient to increase Pantoprazole  to 40 mg twice daily if he has increased throat/esophageal tightness or any difficulty swallowing.  He will call our office if he increase his Pantoprazole  to twice daily as he will need a new prescription at that point.  He will proceed with a modified swallow study as ordered by ENT and will follow-up with Dr. General Kenner 02/29/2024 as planned.

## 2024-01-19 NOTE — Progress Notes (Signed)
 Dr. Greg Leaks, pls review ENT consult.  Refer to barium swallow with tablet result notes Patient has follow up appt with you 02/29/2024 regarding esophageal dysmotility

## 2024-01-26 ENCOUNTER — Ambulatory Visit (HOSPITAL_COMMUNITY)
Admission: RE | Admit: 2024-01-26 | Discharge: 2024-01-26 | Disposition: A | Discharge status: Home / Self Care | Source: Ambulatory Visit | Attending: Otolaryngology | Admitting: Otolaryngology

## 2024-01-26 ENCOUNTER — Ambulatory Visit (HOSPITAL_COMMUNITY)
Admission: RE | Admit: 2024-01-26 | Discharge: 2024-01-26 | Disposition: A | Discharge status: Home / Self Care | Source: Ambulatory Visit | Attending: *Deleted | Admitting: *Deleted

## 2024-01-26 DIAGNOSIS — R1310 Dysphagia, unspecified: Secondary | ICD-10-CM

## 2024-01-26 DIAGNOSIS — R059 Cough, unspecified: Secondary | ICD-10-CM

## 2024-01-26 DIAGNOSIS — R1312 Dysphagia, oropharyngeal phase: Secondary | ICD-10-CM | POA: Diagnosis not present

## 2024-01-26 NOTE — Progress Notes (Signed)
 01/26/24 1400  SLP Visit Information  SLP Received On 01/26/24  General Information  HPI Scott [name]. [name] is an 88 year old male with a past medical history of arthritis, hypertension, hyperlipidemia, coronary artery disease s/p MI and PTCA/coronary stent placement 2007, prostate cancer under surveillance, obstructive sleep apnea uses CPAP, GERD, ? Barrett's esophagus 20+ years ago and colon polyps. Previously known by Dr. Sandrea Cruel. He presents today as referred by his cardiologist, Dr. Berry Bristol, for further evaluation regarding swallowing difficulties, referred to Dr. General Kenner.      He presents today for further evaluation regarding episodes of throat closing which occurs sporadically when eating. He denies having any difficulty swallowing solid foods, liquids or pills. When he eats, he is able to swallow food without difficulty but then has throat tightness. He stated his throat gets tight and closes up which results in difficulty breathing but he is able to sip on water  and the throat tightness abates within one to two minutes. Spicy foods or foods with gravy possible trigger these episodes. He questions if he has a food allergy.  He denies having any heartburn or regurgitation.  No upper or lower abdominal pain.  He passes a normal formed brown bowel movement once daily.  He has a history of GERD and remote Barrett's esophagus which he stated was diagnosed 20 to 25 years ago for which he previously took pantoprazole  20 mg daily but his PCP increased Pantoprazole  to 40 mg daily 6 months ago due to throat tightening symptoms as noted above.  He denies having any chest pain.  He has mild chronic DOE.  No weight loss.  He stated he would like to lose weight. per Md note.  Pt was scoped by Dr Larkin Plumb who documented that he no pooling of secretions, tongue base visualized was normal, true vocal folds were mobile bilaterally and no signs of glottic lesion or mass.  She documented pt had interarytenoid pachydermia  and post cricoid edema.  To this SLP, pt endorses sensation of throat tightening after swallowing solids - occurring further into his meals. Admits drinking liquids helps to lessen this sensation - over a few minutes.  He denies pneumonias, unintentional weight loss nor needing heimlich manuver.  Denies improvement with swallowing since his PPI has been increased but states he has learned how to manage it to decrease its occurence.  States iti was a gradual onset over the last six months. Endorses eating at a seafood restaurant a few times *has a shellfish allergy* and thinks that the hushpuppies were cooked in shellfish causing his to have a few bad episodes in the restaurant - and says it started occuring mostly after this occurence.  He does not drink during his meals - but will drink some cold water  to break up the throat tightening.  States this occurs twice a week and he stops eating and rests until it completely abates.  Caregiver present No  Diet Prior to this Study Regular;Thin liquids (Level 0)  Temperature  Normal  Respiratory Status WFL  Supplemental O2 None (Room air)  History of Recent Intubation No  Behavior/Cognition Alert;Cooperative;Pleasant mood  Self-Feeding Abilities Able to self-feed  Baseline vocal quality/speech Normal  Volitional Cough Able to elicit  Volitional Cough Assessment Appears WFL  Volitional Swallow Able to elicit  Anatomy WFL  Orofacial Exam  Oral Cavity: Oral Hygiene WFL  Orofacial Anatomy WFL  Oral Motor/Sensory Function  (? slight right facial asymmetry - pt report he was stung by a yellow jacket this  week - and he attributes this to that occurence)  Boluses Administered  Boluses Administered Thin liquids (Level 0);Mildly thick liquids (Level 2, nectar thick);Moderately thick liquids (Level 3, honey thick);Puree;Solid  Oral Impairment Domain  Lip Closure No labial escape  Tongue control during bolus hold Cohesive bolus between tongue to palatal seal   Bolus preparation/mastication Slow prolonged chewing/mashing with complete recollection  Bolus transport/lingual motion Slow tongue motion;Brisk tongue motion  Oral residue Residue collection on oral structures  Location of oral residue  Tongue  Initiation of pharyngeal swallow  Valleculae;Posterior laryngeal surface of the epiglottis  Pharyngeal Impairment Domain  Soft palate elevation No bolus between soft palate (SP)/pharyngeal wall (PW)  Laryngeal elevation Partial superior movement of thyroid cartilage/partial approximation of arytenoids to epiglottic petiole  Anterior hyoid excursion Complete anterior movement  Epiglottic movement Partial inversion  Laryngeal vestibule closure Incomplete, narrow column air/contrast in laryngeal vestibule  Pharyngeal stripping wave  Present - diminished  Pharyngeal contraction (A/P view only) Complete  Pharyngoesophageal segment opening Complete distension and complete duration, no obstruction of flow  Tongue base retraction Trace column of contrast or air between tongue base and PPW  Pharyngeal residue Collection of residue within or on pharyngeal structures  Location of pharyngeal residue Valleculae;Tongue base  Esophageal Impairment Domain  Esophageal clearance upright position  (did not sweep esophagus, pt had barium swallow study 10/2023)  Pill  Consistency administered Thin liquids (Level 0)  Penetration/Aspiration Scale Score  1.  Material does not enter airway Thin liquids (Level 0);Mildly thick liquids (Level 2, nectar thick);Moderately thick liquids (Level 3, honey thick);Puree;Solid;Pill  Compensatory Strategies  Compensatory strategies Yes  Liquid wash Effective  Effective Liquid Wash Solid;Puree  Clinical Impression  Clinical Impression Patient presents with functional oropharyngeal swallow ability without aspiration or sense of choking or airway closing off throughout testing.  Swallow characterized by mildly prolonged oral  transitinig and oropharyngeal retention - but given his age, this is [facility].  A-P view did not show any inadequacy with pharyngeal contraction.  His airway closing off episodes are not due to oropharyngeal deficit based on this evaluation.  Given pt has dysmotility, ? if this is causing episodes as pt reports this occurs part way into his meals.  Educated pt to findings and recommendations using video loops. SLP follow up not indicated. Thanks for this consult of this most pleasant man.  SLP Visit Diagnosis Dysphagia, oropharyngeal phase (R13.12)  Factors that may increase risk of adverse event in presence of aspiration Roderick Civatte & Jessy Morocco 2021) Limited mobility;Respiratory or GI disease;Reduced saliva  Exam Limitations Poor participation;Poor positioning  Swallowing Evaluation Recommendations  Recommendations PO diet  PO Diet Recommendation Regular;Thin liquids (Level 0)  Liquid Administration via Cup;Straw  Medication Administration Whole meds with liquid  Supervision Patient able to self-feed  Swallowing strategies   Slow rate;Small bites/sips (start intake with liquids)  Postural changes Position pt fully upright for meals;Stay upright 30-60 min after meals  Oral care recommendations Oral care BID (2x/day)  Caregiver Recommendations Have oral suction available  SLP Time Calculation  SLP Start Time (ACUTE ONLY) 1301  SLP Stop Time (ACUTE ONLY) 1341  SLP Time Calculation (min) (ACUTE ONLY) 40 min  SLP Evaluations  $ SLP Speech Visit 1 Visit  SLP Evaluations  $Outpatient MBS Swallow 1 Procedure  $Swallowing Treatment 1 Procedure  Maudie Sorrow, MS [facility] SLP Acute Rehab Services Office [phone]

## 2024-02-29 ENCOUNTER — Encounter: Payer: Self-pay | Admitting: Gastroenterology

## 2024-02-29 ENCOUNTER — Ambulatory Visit (INDEPENDENT_AMBULATORY_CARE_PROVIDER_SITE_OTHER): Admitting: Gastroenterology

## 2024-02-29 VITALS — BP 126/74 | HR 56 | Ht 64.0 in | Wt 180.5 lb

## 2024-02-29 DIAGNOSIS — Z79899 Other long term (current) drug therapy: Secondary | ICD-10-CM | POA: Diagnosis not present

## 2024-02-29 DIAGNOSIS — R933 Abnormal findings on diagnostic imaging of other parts of digestive tract: Secondary | ICD-10-CM

## 2024-02-29 DIAGNOSIS — K219 Gastro-esophageal reflux disease without esophagitis: Secondary | ICD-10-CM

## 2024-02-29 NOTE — Patient Instructions (Signed)
 Decrease Protonix  (pantoprazole ) 20 mg once daily.  Thank you for entrusting me with your care and for choosing [facility], Dr. Elspeth Naval    If your blood pressure at your visit was 140/90 or greater, please contact your primary care physician to follow up on this. ______________________________________________________  If you are age 86 or older, your body mass index should be between 23-30. Your Body mass index is 30.98 kg/m. If this is out of the aforementioned range listed, please consider follow up with your Primary Care Provider.  If you are age 23 or younger, your body mass index should be between 19-25. Your Body mass index is 30.98 kg/m. If this is out of the aformentioned range listed, please consider follow up with your Primary Care Provider.  ________________________________________________________  The [facility] providers would like to encourage you to use MYCHART to communicate with providers for non-urgent requests or questions.  Due to long hold times on the telephone, sending your provider a message by [facility] may be a faster and more efficient way to get a response.  Please allow 48 business hours for a response.  Please remember that this is for non-urgent requests.  _______________________________________________________  Due to recent changes in healthcare laws, you may see the results of your imaging and laboratory studies on MyChart before your provider has had a chance to review them.  We understand that in some cases there may be results that are confusing or concerning to you. Not all laboratory results come back in the same time frame and the provider may be waiting for multiple results in order to interpret others.  Please give us  48 hours in order for your provider to thoroughly review all the results before contacting the office for clarification of your results.

## 2024-02-29 NOTE — Progress Notes (Signed)
 HPI :  88 year old male, previously known to Dr. Aneita here to reestablish his care with me, I know his wife.  Recall he has multiple medical problems to include history of arthritis, hypertension, hyperlipidemia, coronary artery disease s/p MI and PTCA/coronary stent placement 2007, prostate cancer under surveillance, obstructive sleep apnea uses CPAP, GERD,  colon polyps.  He reestablished care in April and saw Elida Nyle Sharps.  He has longstanding GERD that has been controlled with chronic Protonix .  He remotely was told he had Barrett's esophagus more than 20 years ago on an EGD in 2003, however he had 3 endoscopies since that time, none of which showed Barrett's esophagus.  He reports Protonix  controls his reflux at baseline and he denies any breakthrough symptoms.  However if he does not take it he will have symptoms that bother him so he is been on chronic PPI.  Main issue that has been bothering him has been having episodes of throat closing that occur sporadically when he eats.  This tends to only happen when he is in a restaurant, he has a hard time determining what foods in particular will do it.  He states he has to stop eating and just drink fluids and the episodes abate.  He feels it is more of a breathing issue and throat tightness as opposed to true dysphagia.  He states when he swallows his food and water  tend to go down okay without getting stuck.  He states when he feels his throat get tight however, he needs to stop eating as that will make it worse.  He does endorse a food allergy to shellfish, states it upsets his stomach but he denies any rash, edema swelling etc. outside of these episodes.  He states if he eats at home it typically will not happen.  He has been trying to eat more slowly and be more vigilant about this and he states it may happen once or twice a month.  He is able to eat hamburgers without any dysphagia or problems.  He underwent a barium swallow with us   in May showing tortuous thoracic esophagus with moderate to severe dysmotility.  There was no fixed stricture and the tablet passed without problem.  Some sporadic reflux noted.  He was seen by ENT and had a normal laryngoscopy.  He was also sent for modified barium swallow which failed to show any clear abnormalities to account for his symptoms.  He was increased empirically on his Protonix  to twice daily dosing over the past 3 months he states this has not made any difference in his symptoms so far.   Colonoscopy 03/16/2012 which showed internal hemorrhoids otherwise was normal.    Barium swallow 12/27/23: IMPRESSION: 1. No evidence of a fixed esophageal stricture. A swallowed 13 mm barium tablet traversed the esophagus and passed to the stomach without significant delay. 2. Tortuous thoracic esophagus. 3. Moderate-to-severe intermittent esophageal dysmotility with tertiary contractions. 4. Small hiatal hernia. 5. Small-volume gastroesophageal reflux observed to the level of the lower esophagus with Valsalva. 6. Right-sided aortic arch with aberrant left subclavian artery noted on the prior chest CT of 04/22/2020   Modified barium swallow 01/26/24: Patient presents with functional oropharyngeal swallow ability without aspiration or sense of choking or airway closing off throughout testing. Swallow characterized by mildly prolonged oral transitinig and oropharyngeal retention - but given his age, this is [facility]. A-P view did not show any inadequacy with pharyngeal contraction. His airway closing off episodes are not due to  oropharyngeal deficit based on this evaluation. Given pt has dysmotility, ? if this is causing episodes as pt reports this occurs part way into his meals. Educated pt to findings and recommendations using video loops. SLP follow up not indicated. Thanks for this consult of this most pleasant man.     Past Medical History:  Diagnosis Date   Acid reflux    history of  Barretts esophagus    Adenomatous colon polyp 01/2002   Arthritis 2004   Coronary artery disease 2007   cardiac cath w/ angioplasty & stent placement x 1   Enlarged prostate    Hyperlipidemia    Hypertension    Myocardial infarction [facility]) 2007   while vacationing in [place] , cath. done there & x1 stent placed    Neuromuscular disorder (HCC)    carpal tunnel - both hands    Prostate cancer (HCC) 2013   biopsy- active survelliance, annual biopsies    Sepsis secondary to UTI [facility])    Shortness of breath dyspnea    Sleep apnea 2001   Venous insufficiency      Past Surgical History:  Procedure Laterality Date   ANGIOPLASTY  09/18/2005   1 stent placed   APPENDECTOMY  1989   PROSTATE BIOPSY  12/30/2000   ROTATOR CUFF REPAIR  2004   right   TONSILLECTOMY AND ADENOIDECTOMY  1950   TOTAL KNEE ARTHROPLASTY Left 07/10/2016   Procedure: TOTAL KNEE ARTHROPLASTY;  Surgeon: Marcey Her, MD;  Location: [facility] OR;  Service: Orthopedics;  Laterality: Left;   TOTAL KNEE ARTHROPLASTY Right 01/09/2022   Procedure: TOTAL KNEE ARTHROPLASTY;  Surgeon: Her Marcey, MD;  Location: [facility] ORS;  Service: Orthopedics;  Laterality: Right;   VASECTOMY  1970   Family History  Problem Relation Age of Onset   Heart disease Mother    Diabetes Father    Heart disease Father    Diabetes Sister    Prostate cancer Brother    Diabetes Brother    Diabetes Son    Liver disease Neg Hx    Esophageal cancer Neg Hx    Colon cancer Neg Hx    Social History   Tobacco Use   Smoking status: Never   Smokeless tobacco: Never  Vaping Use   Vaping status: Never Used  Substance Use Topics   Alcohol use: No   Drug use: No   Current Outpatient Medications  Medication Sig Dispense Refill   albuterol  (VENTOLIN  HFA) 108 (90 Base) MCG/ACT inhaler Inhale 2 puffs into the lungs every 6 (six) hours as needed for wheezing or shortness of breath. 18 g 2   alfuzosin  (UROXATRAL ) 10 MG 24 hr tablet Take 10 mg by mouth at bedtime.      Ascorbic Acid  (VITAMIN C ) 1000 MG tablet Take 1,000 mg by mouth daily with breakfast.     aspirin  EC (ASPIRIN  81) 81 MG tablet 81 mg.     atorvastatin  (LIPITOR) 40 MG tablet Take 40 mg by mouth daily with supper.     cetirizine (ZYRTEC) 10 MG tablet Take 10 mg by mouth daily with breakfast.     eplerenone  (INSPRA ) 25 MG tablet TAKE 1 TABLET BY MOUTH IN THE  MORNING 90 tablet 2   ezetimibe  (ZETIA ) 10 MG tablet TAKE 1 TABLET BY MOUTH DAILY  AFTER SUPPER 90 tablet 2   finasteride  (PROSCAR ) 5 MG tablet Take 5 mg by mouth daily with supper.     fluticasone  (FLONASE ) 50 MCG/ACT nasal spray Place into both nostrils daily.  fluticasone  furoate-vilanterol (BREO ELLIPTA ) 200-25 MCG/ACT AEPB Inhale 1 puff into the lungs daily. 60 each 5   gabapentin  (NEURONTIN ) 300 MG capsule Take 300 mg by mouth 2 (two) times daily.     montelukast  (SINGULAIR ) 10 MG tablet Take 10 mg by mouth daily with breakfast.     Multiple Vitamin (MULTIVITAMIN WITH MINERALS) TABS tablet Take 1 tablet by mouth at bedtime.     oxybutynin  (DITROPAN ) 5 MG tablet Take 5 mg by mouth 2 (two) times daily.     pantoprazole  sodium (PROTONIX ) 40 mg Take 40 mg by mouth daily before breakfast.     Current Facility-Administered Medications  Medication Dose Route Frequency Provider Last Rate Last Admin   fluticasone  furoate-vilanterol (BREO ELLIPTA ) 100-25 MCG/ACT 1 puff  1 puff Inhalation Daily        Allergies  Allergen Reactions   Other Other (See Comments)   Shellfish Allergy Nausea And Vomiting   Sulfa Antibiotics Other (See Comments)    Feels fatigued and weak when taking sulfa for long durations    Misc. Sulfonamide Containing Compounds Other (See Comments)     Review of Systems: All systems reviewed and negative except where noted in HPI.     Physical Exam: BP 126/74   Pulse (!) 56   Ht 5' 4 (1.626 m)   Wt 180 lb 8 oz (81.9 kg)   SpO2 95%   BMI 30.98 kg/m  Constitutional: Pleasant,well-developed, male in no acute  distress. Neurological: Alert and oriented to person place and time. Psychiatric: Normal mood and affect. Behavior is normal.   ASSESSMENT: 88 y.o. male here for assessment of the following  1. Gastroesophageal reflux disease, unspecified whether esophagitis present   2. Abnormal barium swallow   3. Long-term current use of proton pump inhibitor therapy    Long-term GERD well-controlled on PPI.  Very remote history of short segment Barrett's which was not seen on his last 3 endoscopies.  On chronic PPI needed to control his reflux symptoms, he appears to tolerate it well and it works well for him.  He has had these episodes of throat closing when eating at restaurants, he denies any baseline dysphagia.  He has had laryngoscopy and modified barium swallow that have been normal.  Traditional barium swallow with tablet shows no stricturing but he does have significant dysmotility.  We discussed the finding, potentially his symptoms could be related to dysmotility but he does not have any typical dysphagia on a routine basis.  His episodes seem more so when eating at restaurants or foods he does not normally eat.  He does have food allergies however denies any rash, swelling, wheezing etc. associated with these episodes.  He has learned to manage these episodes with just drinking fluids and they abort with short amount of time.  We discussed how to proceed from here.  I do not think reflux is causing his symptoms, he has had no benefit from higher dosing of PPI, I think can reduce dose of Protonix  to 20 mg once daily.  We discussed long-term risks of chronic PPI use, he understands, want to use lowest dose needed to control symptoms, so he is agreeable to stay on 20 mg/day as he has symptoms that bother him without it.  Otherwise, I think the yield of EGD is going to be quite low given the barium study findings.  Symptoms seem mild and intermittent at this point and not too bothersome.  He will chew  food well, eat slowly, drink  plenty of fluids when he is swallowing.  If things worsen he will let me know.   PLAN: - reduce back to protonix  20mg  / day - no improvement with higher dosing. No BE on last exams. Discussed long term risks / benefits - dysmotility could be causing symptoms, doing better with modification and eating slower, avoiding triggers - think yield of EGD will be low, he agrees, will hold off for now - f/u PRN  Marcey Naval, MD [facility]

## 2024-07-21 ENCOUNTER — Encounter: Payer: Self-pay | Admitting: Internal Medicine

## 2024-07-21 ENCOUNTER — Ambulatory Visit: Admitting: Internal Medicine

## 2024-07-21 VITALS — BP 116/64 | HR 53 | Temp 97.7°F | Ht 64.0 in | Wt 179.4 lb

## 2024-07-21 DIAGNOSIS — Z87898 Personal history of other specified conditions: Secondary | ICD-10-CM | POA: Diagnosis not present

## 2024-07-21 DIAGNOSIS — J4540 Moderate persistent asthma, uncomplicated: Secondary | ICD-10-CM

## 2024-07-21 NOTE — Patient Instructions (Addendum)
 ICD-10-CM   1. Moderate persistent asthma without complication  J45.40     2. History of wheezing  Z87.898        - clinically stable and well controlled - Intermittent wheezing that you believe is coming from throat making advair /breo ineffective - glad uptodate with vaccines - cxr clear in aug 2022   Plan - shared decision making - continue singulair  daily - continue GERD control - continue albuterol  as needed  - CMA to do refill   Follow-up -6 months with APP or sooner if needed 12 months Sahaj Bona

## 2024-07-21 NOTE — Progress Notes (Signed)
 OV 04/11/2020  Subjective:  Patient ID: Scott [name], male , DOB: [birth year] , age 88 y.o. , MRN: [id number] , ADDRESS: [address] [place] [address]  PCP   Luke Agent, MD  04/11/2020 -   Chief Complaint  Patient presents with   Consult    DOE     HPI Scott [name] 88 y.o. -referred by for shortness of breath on exertion reli relieved by rest.  He is here at the request of his wife.  Patient is well-known to me.  I I take care of his wife.  He tells me for the last several years he has had insidious onset of shortness of breath with exertion.  Gradually and steadily getting worse.  Dyspnea is rated as a 5 out of 10.  He has associated cough that is rated as a 3 out of 10 and wheezing that is rated as 1 out of 10.  He is associated heavy seasonal allergies that is actually perennial now.  He says when he stops his antihistamines his symptoms come back pretty rapidly.  Nevertheless shortness of breath is experienced when he does grocery shopping or climbs 2 flights of stairs.  He does not have symptoms of one flight of stairs.  When he does grocery shopping his knees bother him more than shortness of breath but when he climbs stairs 2 flights shortness of breath bothers him more than the knees.  There is no associated chest pain.  He had a cardiac stress test this year and it is normal and documented below.  His last CT scan of the chest was in 2017 where he had some pleural effusions.  Of note his shortness of breath is progressive but his cough and wheezing is static  Lab review shows mild anemia in 2017 but no further labs with us   He also has mild chronic kidney disease last checked within our health system in 2017.  In 2017 CT chest did show small pericardial effusion  His most recent echocardiogram in June 2021 shows grade 1 diastolic dysfunction.  He has associated visceral obesity.  There is no orthopnea.  He has some mild chronic venous stasis edema particularly on the left  side.  There is no change to this.  There is no hemoptysis.   He has never had allergy tests  Simple office walk 185 feet x  3 laps goal with forehead probe 04/11/2020   O2 used ra  Number laps completed 3  Comments about pace avg  Resting Pulse Ox/HR 98% and 84/min  Final Pulse Ox/HR 98% and 82/min  Desaturated </= 88% no  Desaturated <= 3% points no  Got Tachycardic >/= 90/min no  Symptoms at end of test No dyspnea  Miscellaneous comments x      CT chest June 2017  IMPRESSION: Small bilateral pleural effusions and mild atelectasis/scarring over the lingula and left lower lobe.   Small pericardial effusion.   Three vessel atherosclerotic coronary artery disease. Aortic atherosclerosis.   Right-sided aortic arch with aberrant left subclavian artery.     Electronically Signed   By: Toribio Agreste M.D.   On: 01/30/2016 15:56   ROS Echocardiogram February 07, 2020  Echocardiogram 02/07/2020:  Normal LV systolic function with EF 59%. Left ventricle cavity is normal  in size. Mild concentric hypertrophy of the left ventricle. Normal global  wall motion. Doppler evidence of grade I (impaired) diastolic dysfunction,  normal LAP. Calculated EF 59%.  Left atrial cavity is moderately dilated.  Structurally normal mitral valve.  Mild (Grade I) mitral regurgitation.  Pericardium is normal. Insignificant pericardial effusion with clear  fluids.  IVC is dilated with respiratory variation. This may suggest elevated right  heart pressure  No significant change from 05/09/2015.  Lexiscan /modified Bruce Tetrofosmin stress test 02/14/2020: Lexiscan /modified Bruce nuclear stress test performed using 1-day protocol. Stress EKG is non-diagnostic, as this is pharmacological stress test. In addition, stress EKG at 110% MPHR showed sinus tachycardia, old inferior infarct. Mild decrease in reduced radiotracer update in inferior myocardium at rest and stress. In setting of normal myocardial  thickening, this likely represents tissue attenuation artifact. Stress LVEF 77%. Low risk study.   OV 05/15/2020  Subjective:  Patient ID: Scott [name], male , DOB: 04/17/1936 , age 52 y.o. , MRN: [id number] , ADDRESS: [address] [place] [address] PCP Luke Agent, MD Relevant Other Providers: - Dr Curlene ards This Provider for this visit: Treatment Team:  Attending Provider: Geronimo Amel, MD    05/15/2020 -   Chief Complaint  Patient presents with   Follow-up    CT scan 9/13, PFT Today wants to talk about results of both, breathing the same as last visit shortness of breath with exertion     HPI Scott [name] 88 y.o. -here to discuss test results.  Pulmonary function test essentially normal..  CT scan shows cirrhosis with trace pleural effusion and some air trapping but otherwise normal.  He is not aware of the fact that he has cirrhosis.  This was followed up with a right upper quadrant ultrasound that did not show cirrhosis but does asymptomatic gallstones he denies any drinking.  There is no interstitial lung disease or pneumonia or cancer.  However he still has symptoms.  At this point in time his symptoms are minimal.  He states that his main priority is to make sure there is nothing acutely abnormal with his lungs explain his symptoms.  He is willing to try an empiric inhaler    CLINICAL DATA:  Increased shortness of breath on exertion, wheezing and chronic cough.   EXAM: CT CHEST WITHOUT CONTRAST   TECHNIQUE: Multidetector CT imaging of the chest was performed following the standard protocol without intravenous contrast. High resolution imaging of the lungs, as well as inspiratory and expiratory imaging, was performed.   COMPARISON:  01/30/2016.   FINDINGS: Cardiovascular: Right-sided aortic arch. Atherosclerotic calcification of the aorta, aortic valve and coronary arteries. Heart is enlarged. Small amount of pericardial fluid is unchanged.   Mediastinum/Nodes:  No pathologically enlarged mediastinal or axillary lymph nodes. Hilar regions are difficult to definitively evaluate without IV contrast. There are calcified right hilar lymph nodes. Esophagus is grossly unremarkable.   Lungs/Pleura: Image quality is degraded by respiratory motion. Negative for subpleural reticulation, traction bronchiectasis/bronchiolectasis, ground-glass, architectural distortion or honeycombing. Trace right pleural fluid. Airway is unremarkable. There is air trapping.   Upper Abdomen: Liver margin is irregular. Stones layer in the gallbladder. Adrenal glands are unremarkable. Low-attenuation lesions in the kidneys measure up to 3.2 cm on the right, incompletely imaged. Visualized portions of the spleen, pancreas, stomach and bowel are unremarkable with the exception of a small hiatal hernia.   Musculoskeletal: Degenerative changes in the spine and shoulders. No worrisome lytic or sclerotic lesions. Old rib fractures.   IMPRESSION: 1. Negative for interstitial lung disease. Air trapping is indicative of small airways disease. 2. Trace right pleural effusion. 3. Cirrhosis. 4. Cholelithiasis. 5. Aortic atherosclerosis (  ICD10-I70.0). Coronary artery calcification.     Electronically Signed   By: Newell Eke M.D.   On: 04/22/2020 12:06   IMPRESSION: Cholelithiasis without evidence of cholecystitis. No other abnormality seen in the right upper quadrant of the abdomen.     Electronically Signed   By: Lynwood Landy Raddle M.D.   On: 05/01/2020 16:11 ROS - per HPI  Results for CASEN, PRYOR (MRN [id number]) as of 05/15/2020 10:50  Ref. Range 04/11/2020 10:02  IgE (Immunoglobulin E), Serum Latest Ref Range: <OR=114 kU/L 56  R  Results for SAYAN, ALDAVA (MRN [id number]) as of 05/15/2020 10:50  Ref. Range 04/11/2020 10:02  Eosinophils Absolute Latest Ref Range: 0 - 0 K/uL 0.2  Results for BOB, DAVERSA (MRN [id number]) as of 05/15/2020 10:50  Ref.  Range 04/11/2020 10:02  Hemoglobin Latest Ref Range: 13.0 - 17.0 g/dL 86.3    OV 1/84/7977  Subjective:  Patient ID: Scott JONETTA Sherre, male , DOB: 1935-09-25 , age 66 y.o. , MRN: [id number] , ADDRESS: [address] [place] [address] PCP Luke Lynwood, MD Patient Care Team: Luke Lynwood, MD as PCP - General (Internal Medicine)  This Provider for this visit: Treatment Team:  Attending Provider: Geronimo Amel, MD    03/24/2021 -   Chief Complaint  Patient presents with   Follow-up    Pt states he has been doing okay since last visit.     HPI Scott [name] 88 y.o. -follow-up for dyspnea, history of wheezing, chronic cough with the only evidence of asthma being pulmonary air-trapping seen on previous PFT and CT scan [also found to have subclinical cirrhosis and some trace pleural effusion on CT scan a year ago] at this visit he says he has been busy taking care of his wife Earnie Arenas with urologic issues.  His sister-in-law also has been sick earlier in the year.  He says that he is using Advair  right now because it is cheaper than air duo.  He tells me that he does not think the Advair  has helped his symptoms but he believes that his wife has reported that his cough and shortness of breath and wheezing is significantly better.  Therefore he is inclined to continue with it.  He says other than getting older and feeling a generally little more tired he has no other health issues.    OV 09/10/2022  Subjective:  Patient ID: Scott JONETTA Sherre, male , DOB: 1936-06-28 , age 35 y.o. , MRN: [id number] , ADDRESS: [address] [place] [address] PCP Gerome Brunet, DO Patient Care Team: Gerome Brunet, DO as PCP - General (Family Medicine)  This Provider for this visit: Treatment Team:  Attending Provider: Geronimo Amel, MD    09/10/2022 -   Chief Complaint  Patient presents with   Follow-up    Wheezing both at rest and exertion x 3 months.  Some cough. Pt feels inhaler (Advair )  does not help with  DOE.     HPI Scott [name] 87 y.o. -returns for his asthma follow-up.  I personally not seen him in nearly 2 years.  He is currently taking Advair  and Singulair .  He says he is stable.  He says he is on and off wheezing but that does not bother him.  He feels his asthma is well-controlled.  He is up-to-date with all his respiratory vaccines.  No albuterol  rescue use.  No prednisone usage no hospitalizations no emergency room visits no urgent care visits.  Of note his wife Ronal Arenas was my patient with chronic cough and DIPNECH passed away in 11-06-2023from frequent UTIs.  She was in hospice.  He is now taking care of her Sister Levorn Pizza was heart failure.  He was in tears talking about pulmonary I sleep.REvioeed Last CXR summer 2022 and clear  OV 04/27/2023  Subjective:  Patient ID: Scott [name], male , DOB: Sep 20, 1935 , age 14 y.o. , MRN: [id number] , ADDRESS: [address] [place] [address] PCP Gerome Brunet, DO Patient Care Team: Gerome Brunet, DO as PCP - General (Family Medicine)  This Provider for this visit: Treatment Team:  Attending Provider: Geronimo Amel, MD    04/27/2023 -   Chief Complaint  Patient presents with   Follow-up    Breathing is overall doing well. He has occ cough with throat irritation. Cough is non prod. Will get flu vax when he sees his PCP next.      HPI Scott [name] 88 y.o. -returns for follow-up.  Is doing well.  Clinically stable.  No emergency room visits no urgent care visits no surgeries.  No new medical problems but in the interim his Protonix  did get increased because of his cough and the cough did improve.  Next month will be 1 year since his wife passed away.  1 year ago his sister-in-law Levorn Pizza was also my patient passed today from heart failure and pulmonary hypertension and failure to thrive.  His continues to live alone and manages activities of daily living including driving.  He has not had his flu shot but plans to have it through  his primary care physician.  Last chest x-ray August 2022.  OV 07/21/2024  Subjective:  Patient ID: Scott [name], male , DOB: 08/31/1935 , age 64 y.o. , MRN: [id number] , ADDRESS: [address] [place] [address] PCP Gerome Brunet, DO Patient Care Team: Gerome Brunet, DO as PCP - General (Family Medicine)  This Provider for this visit: Treatment Team:  Attending Provider: Geronimo Amel, MD    07/21/2024 -   Chief Complaint  Patient presents with   Asthma    Pt states since LOV breathing has been pretty good      HPI Scott [name] 88 y.o. -Ubaldo Daywalt is an 88 year old male who presents for follow-up of respiratory symptoms.  He stopped taking Breo Ellipta  three months ago as he did not notice any improvement in his symptoms. Previously, he was on Advair , but his insurance stopped covering it. There has been no change in his symptoms since discontinuing Breo Ellipta .  He experiences wheezing, which he perceives to be located in his throat rather than his lungs. He is currently taking montelukast  (Singulair ) and has been on it for some time.  He lives alone but spends a significant amount of time with his girlfriend, Donia Hock, who is 2 years old and in good health. No new health issues, hospitalizations, emergency room visits, or surgeries. Symptoms are stable with no changes.       Latest Ref Rng & Units 05/15/2020    8:45 AM  PFT Results  FVC-Pre L 3.14   FVC-Predicted Pre % 97   FVC-Post L 3.18   FVC-Predicted Post % 99   Pre FEV1/FVC % % 82   Post FEV1/FCV % % 87   FEV1-Pre L 2.56   FEV1-Predicted Pre % 114   FEV1-Post L 2.75   DLCO uncorrected ml/min/mmHg 23.17  DLCO UNC% % 110   DLCO corrected ml/min/mmHg 23.87   DLCO COR %Predicted % 114   DLVA Predicted % 114   TLC L 5.83   TLC % Predicted % 94   RV % Predicted % 101        LAB RESULTS last 96 hours No results found.       has a past medical history of Acid reflux,  Adenomatous colon polyp (01/2002), Arthritis (2004), Coronary artery disease (2007), Enlarged prostate, Hyperlipidemia, Hypertension, Myocardial infarction (HCC) (2007), Neuromuscular disorder (HCC), Prostate cancer (HCC) (2013), Sepsis secondary to UTI [facility]), Shortness of breath dyspnea, Sleep apnea (2001), and Venous insufficiency.   reports that he has never smoked. He has never used smokeless tobacco.  Past Surgical History:  Procedure Laterality Date   ANGIOPLASTY  09/18/2005   1 stent placed   APPENDECTOMY  1989   PROSTATE BIOPSY  12/30/2000   ROTATOR CUFF REPAIR  2004   right   TONSILLECTOMY AND ADENOIDECTOMY  1950   TOTAL KNEE ARTHROPLASTY Left 07/10/2016   Procedure: TOTAL KNEE ARTHROPLASTY;  Surgeon: Marcey Her, MD;  Location: [facility] OR;  Service: Orthopedics;  Laterality: Left;   TOTAL KNEE ARTHROPLASTY Right 01/09/2022   Procedure: TOTAL KNEE ARTHROPLASTY;  Surgeon: Her Marcey, MD;  Location: [facility] ORS;  Service: Orthopedics;  Laterality: Right;   VASECTOMY  1970    Allergies[1]  Immunization History  Administered Date(s) Administered   PFIZER(Purple Top)SARS-COV-2 Vaccination 09/01/2019, 09/22/2019, 10/03/2019, 05/14/2020   PNEUMOCOCCAL CONJUGATE-20 07/31/2021   Pfizer Covid-19 Vaccine Bivalent Booster 39yrs & up 05/01/2021   Pfizer(Comirnaty)Fall Seasonal Vaccine 12 years and older 09/13/2023   Pneumococcal Conjugate-13 10/02/2013    Family History  Problem Relation Age of Onset   Heart disease Mother    Diabetes Father    Heart disease Father    Diabetes Sister    Prostate cancer Brother    Diabetes Brother    Diabetes Son    Liver disease Neg Hx    Esophageal cancer Neg Hx    Colon cancer Neg Hx     Current Medications[2]      Objective:   Vitals:   07/21/24 1109  BP: 116/64  Pulse: (!) 53  Temp: 97.7 F (36.5 C)  TempSrc: Oral  SpO2: 97%  Weight: 179 lb 6.4 oz (81.4 kg)  Height: 5' 4 (1.626 m)    Estimated body mass index is 30.79 kg/m as  calculated from the following:   Height as of this encounter: 5' 4 (1.626 m).   Weight as of this encounter: 179 lb 6.4 oz (81.4 kg).  @WEIGHTCHANGE @  Filed Weights   07/21/24 1109  Weight: 179 lb 6.4 oz (81.4 kg)     Physical Exam   General: No distress. Looks well O2 at rest: no Cane present: no Sitting in wheel chair: no Frail: no Obese: no Neuro: Alert and Oriented x 3. GCS 15. Speech normal Psych: Pleasant Resp:  Barrel Chest - no.  Wheeze - no, Crackles - no, No overt respiratory distress CVS: Normal heart sounds. Murmurs - no Ext: Stigmata of Connective Tissue Disease - no HEENT: Normal upper airway. PEERL +. No post nasal drip. HEARING AID +        Assessment/     Assessment & Plan Moderate persistent asthma without complication  History of wheezing    PLAN Patient Instructions     ICD-10-CM   1. Moderate persistent asthma without complication  J45.40     2. History of  wheezing  Z87.898        - clinically stable and well controlled - Intermittent wheezing that you believe is coming from throat making advair /breo ineffective - glad uptodate with vaccines - cxr clear in aug 2022   Plan - shared decision making - continue singulair  daily - continue GERD control - continue albuterol  as needed  - CMA to do refill   Follow-up -6 months with APP or sooner if needed 12 months Callen Zuba    FOLLOWUP    Return for -6 months with APP  AND 12 months Kellsie Grindle.    SIGNATURE    Dr. Dorethia Cave, M.D., F.C.C.P,  Pulmonary and Critical Care Medicine Staff Physician, [facility] Center Director - Interstitial Lung Disease  Program  Pulmonary Fibrosis [facility] [place], [place], [address]  Pager: [phone], If no answer or between  15:00h - 7:00h: call [phone] Telephone: [phone]  2:49 PM 07/21/2024    [1]  Allergies Allergen Reactions   Other Other (See Comments)    Shellfish Allergy Nausea And Vomiting   Sulfa Antibiotics Other (See Comments)    Feels fatigued and weak when taking sulfa for long durations    Misc. Sulfonamide Containing Compounds Other (See Comments)  [2]  Current Outpatient Medications:    albuterol  (VENTOLIN  HFA) 108 (90 Base) MCG/ACT inhaler, Inhale 2 puffs into the lungs every 6 (six) hours as needed for wheezing or shortness of breath., Disp: 18 g, Rfl: 2   alfuzosin  (UROXATRAL ) 10 MG 24 hr tablet, Take 10 mg by mouth at bedtime., Disp: , Rfl:    Ascorbic Acid  (VITAMIN C ) 1000 MG tablet, Take 1,000 mg by mouth daily with breakfast., Disp: , Rfl:    aspirin  EC (ASPIRIN  81) 81 MG tablet, 81 mg., Disp: , Rfl:    atorvastatin  (LIPITOR) 40 MG tablet, Take 40 mg by mouth daily with supper., Disp: , Rfl:    cetirizine (ZYRTEC) 10 MG tablet, Take 10 mg by mouth daily with breakfast., Disp: , Rfl:    eplerenone  (INSPRA ) 25 MG tablet, TAKE 1 TABLET BY MOUTH IN THE  MORNING, Disp: 90 tablet, Rfl: 2   ezetimibe  (ZETIA ) 10 MG tablet, TAKE 1 TABLET BY MOUTH DAILY  AFTER SUPPER, Disp: 90 tablet, Rfl: 2   finasteride  (PROSCAR ) 5 MG tablet, Take 5 mg by mouth daily with supper., Disp: , Rfl:    fluticasone  (FLONASE ) 50 MCG/ACT nasal spray, Place into both nostrils daily., Disp: , Rfl:    gabapentin  (NEURONTIN ) 300 MG capsule, Take 300 mg by mouth 2 (two) times daily., Disp: , Rfl:    montelukast  (SINGULAIR ) 10 MG tablet, Take 10 mg by mouth daily with breakfast., Disp: , Rfl:    Multiple Vitamin (MULTIVITAMIN WITH MINERALS) TABS tablet, Take 1 tablet by mouth at bedtime., Disp: , Rfl:    oxybutynin  (DITROPAN ) 5 MG tablet, Take 5 mg by mouth 2 (two) times daily., Disp: , Rfl:    pantoprazole  sodium (PROTONIX ) 40 mg, Take 40 mg by mouth daily before breakfast. (Patient taking differently: Take 20 mg by mouth daily before breakfast.), Disp: , Rfl:    fluticasone  furoate-vilanterol (BREO ELLIPTA ) 200-25 MCG/ACT AEPB, Inhale 1 puff into the lungs daily.  (Patient not taking: Reported on 07/21/2024), Disp: 60 each, Rfl: 5  Current Facility-Administered Medications:    fluticasone  furoate-vilanterol (BREO ELLIPTA ) 100-25 MCG/ACT 1 puff, 1 puff, Inhalation, Daily,

## 2024-09-02 ENCOUNTER — Other Ambulatory Visit: Payer: Self-pay | Admitting: Cardiology

## 2024-09-02 DIAGNOSIS — I482 Chronic atrial fibrillation, unspecified: Secondary | ICD-10-CM

## 2024-09-02 DIAGNOSIS — E7800 Pure hypercholesterolemia, unspecified: Secondary | ICD-10-CM

## 2024-09-08 NOTE — Telephone Encounter (Signed)
 In accordance with refill protocols, please review and address the following requirements before this medication refill can be authorized:  Labs  Pt needed labs within 180 days within normal range this was not done

## 2025-01-19 ENCOUNTER — Ambulatory Visit: Admitting: Adult Health
